# Patient Record
Sex: Male | Born: 1945 | Race: White | Hispanic: No | Marital: Single | Smoking: Former smoker
Health system: Southern US, Community
[De-identification: ages and names within clinical notes are randomized; demographics above are authoritative.]

## PROBLEM LIST (undated history)

## (undated) DIAGNOSIS — D689 Coagulation defect, unspecified: Secondary | ICD-10-CM

## (undated) DIAGNOSIS — H9319 Tinnitus, unspecified ear: Secondary | ICD-10-CM

## (undated) DIAGNOSIS — I10 Essential (primary) hypertension: Secondary | ICD-10-CM

## (undated) DIAGNOSIS — J329 Chronic sinusitis, unspecified: Secondary | ICD-10-CM

## (undated) DIAGNOSIS — E785 Hyperlipidemia, unspecified: Secondary | ICD-10-CM

## (undated) HISTORY — PX: CATARACT EXTRACTION: SUR2

## (undated) HISTORY — DX: Hyperlipidemia, unspecified: E78.5

## (undated) HISTORY — DX: Chronic sinusitis, unspecified: J32.9

## (undated) HISTORY — DX: Essential (primary) hypertension: I10

## (undated) HISTORY — DX: Tinnitus, unspecified ear: H93.19

## (undated) HISTORY — DX: Coagulation defect, unspecified: D68.9

---
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
## 2011-08-31 ENCOUNTER — Ambulatory Visit: Payer: Self-pay | Admitting: Internal Medicine

## 2011-09-15 ENCOUNTER — Inpatient Hospital Stay: Payer: Self-pay | Admitting: Student

## 2011-09-29 ENCOUNTER — Ambulatory Visit: Payer: Self-pay | Admitting: Internal Medicine

## 2011-10-01 ENCOUNTER — Ambulatory Visit: Payer: Self-pay | Admitting: Internal Medicine

## 2012-03-21 DIAGNOSIS — J309 Allergic rhinitis, unspecified: Secondary | ICD-10-CM | POA: Insufficient documentation

## 2012-10-27 IMAGING — US US EXTREM LOW VENOUS*L*
1 series · 17 of 24 positions shown · non-contrast
Comparison: none

REASON FOR EXAM: pain  swelling
COMMENTS:

PROCEDURE:     US  - US DOPPLER LOW EXTR LEFT  - September 15, 2011  [DATE]
RESULT:     Comparison: None

[Series 1: us extrem low venous*left* · 17 of 24 slices shown]
[im 1/24]
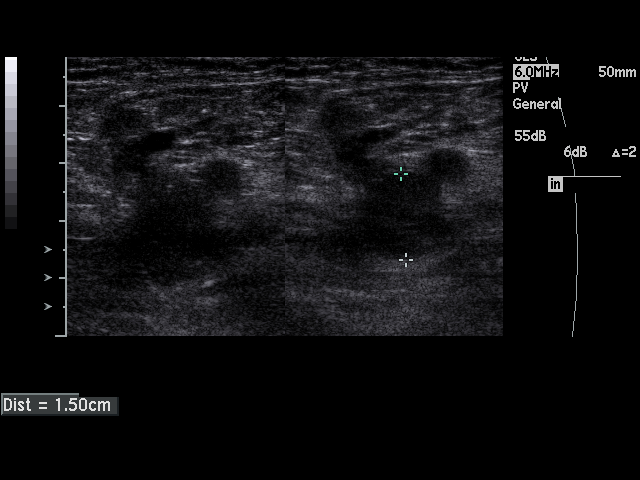
[im 3/24]
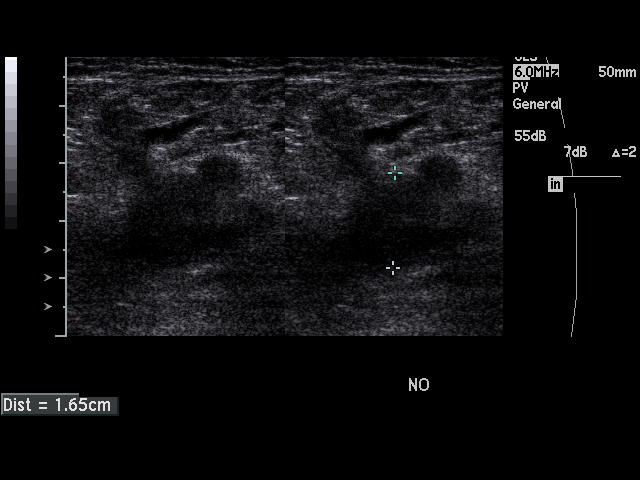
[im 4/24]
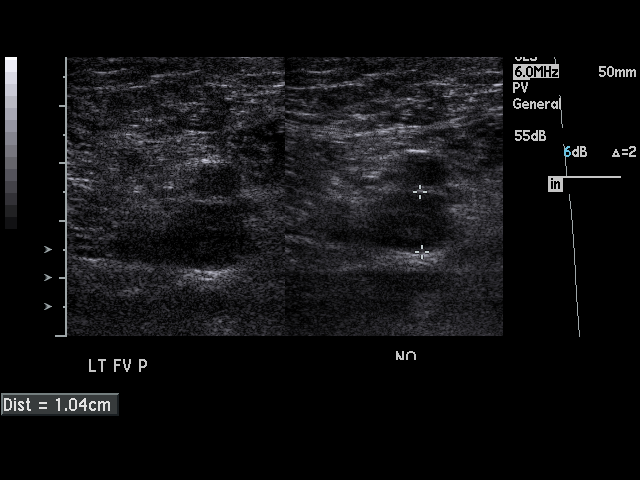
[im 5/24]
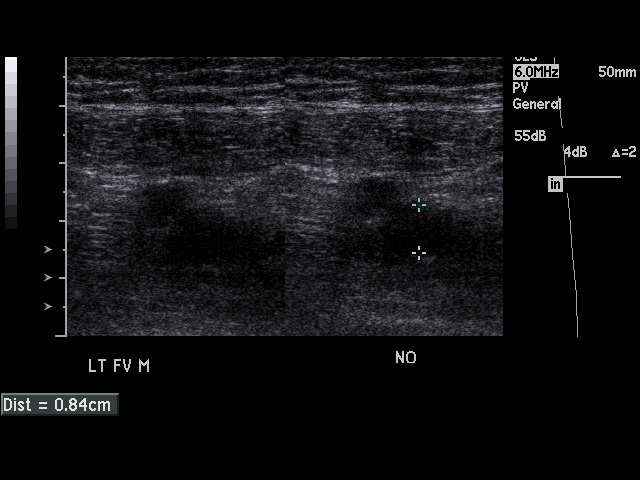
[im 7/24]
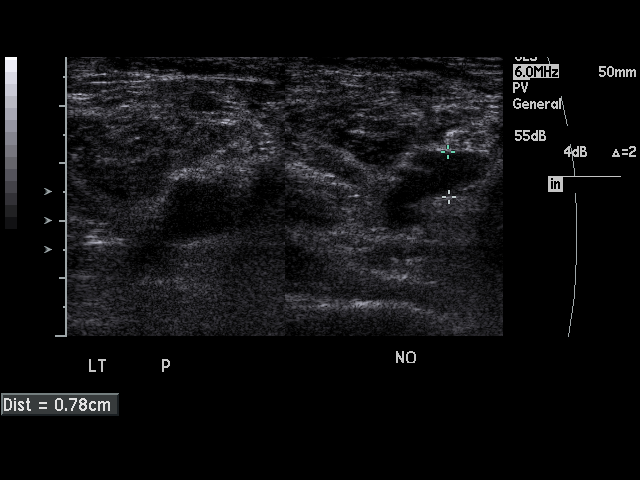
[im 8/24]
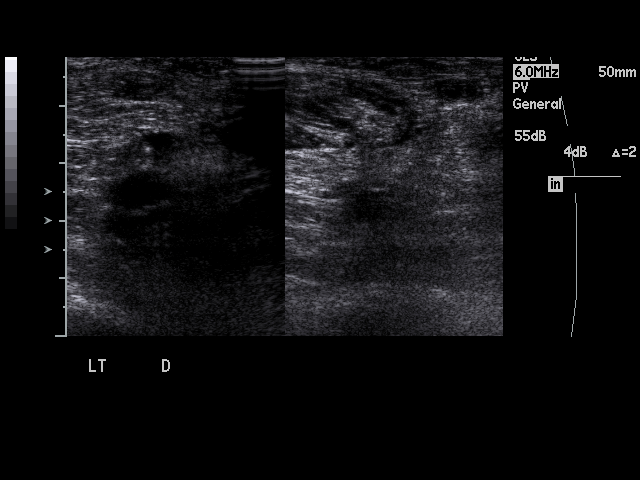
[im 10/24]
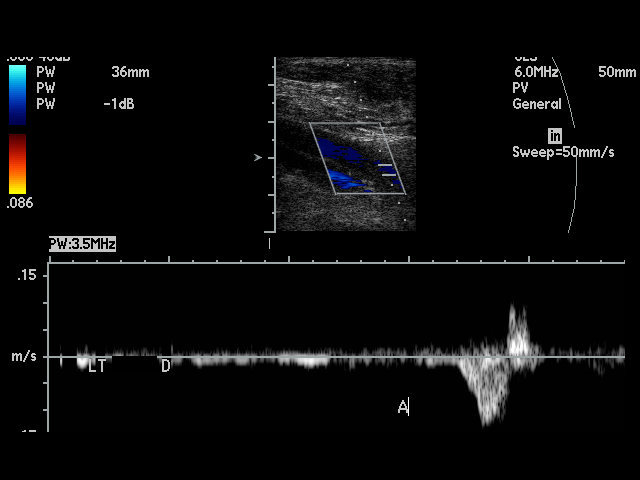
[im 11/24]
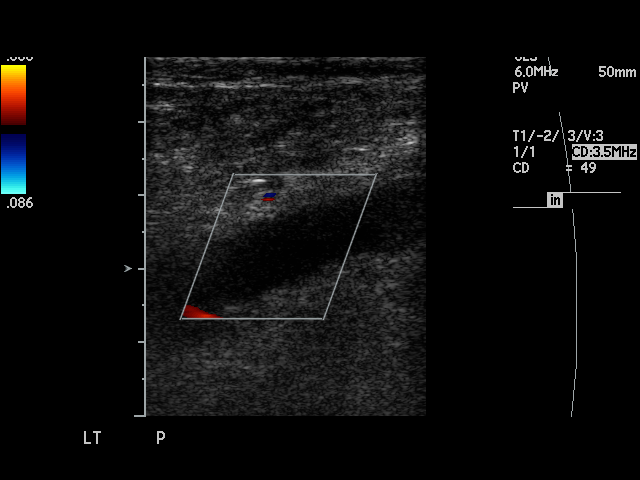
[im 13/24]
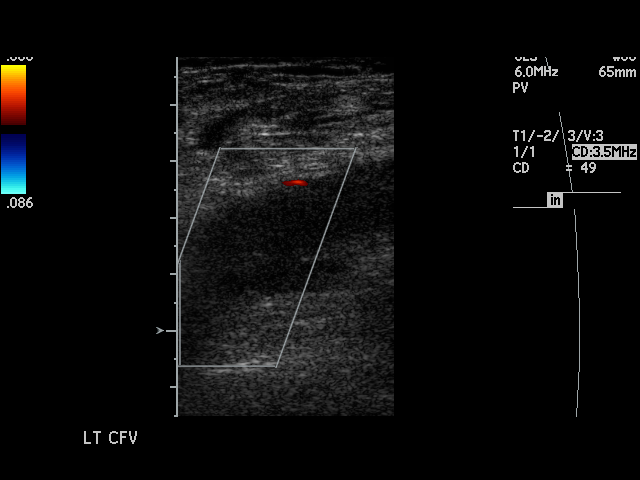
[im 14/24]
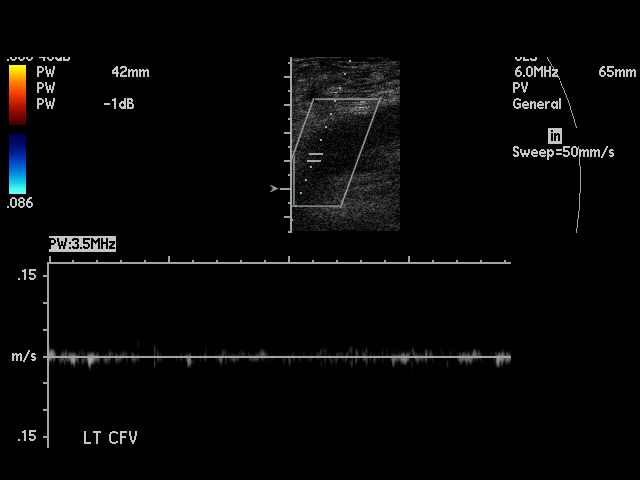
[im 15/24]
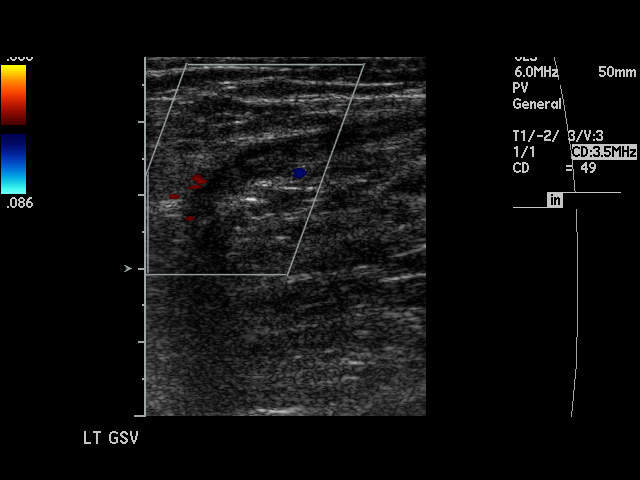
[im 17/24]
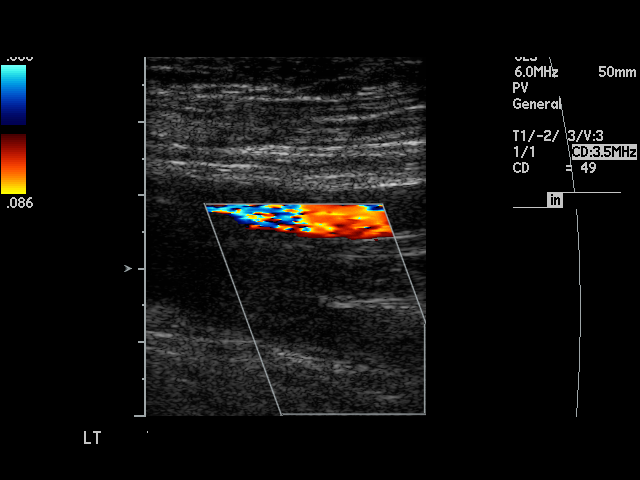
[im 18/24]
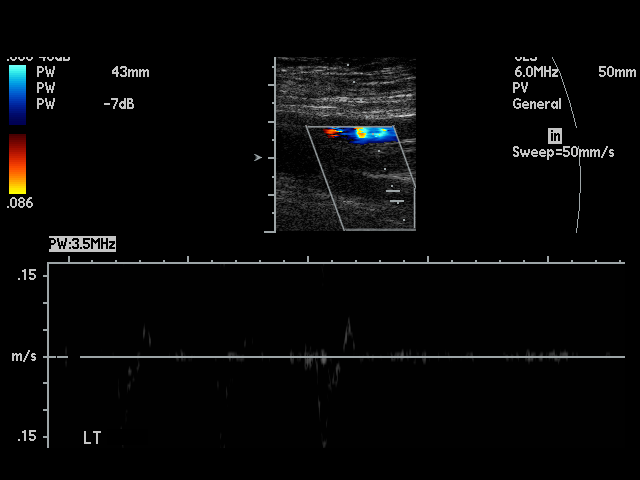
[im 20/24]
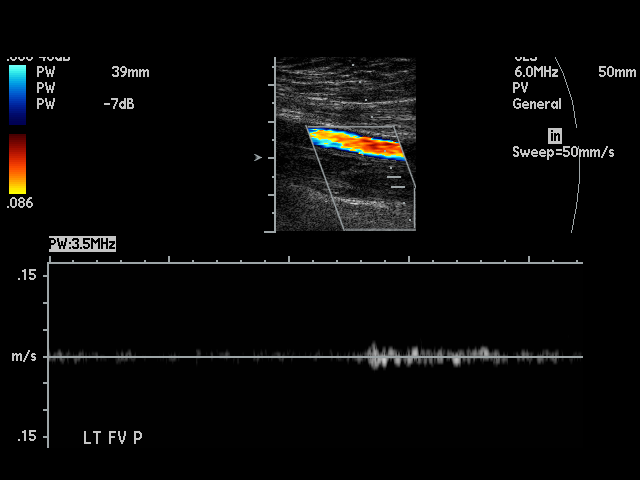
[im 21/24]
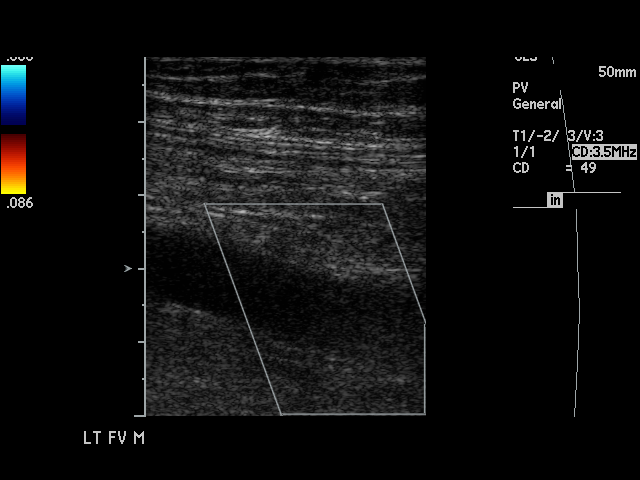
[im 22/24]
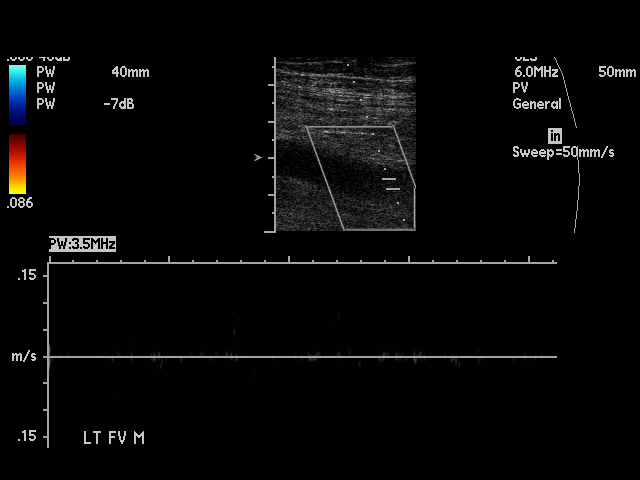
[im 24/24]
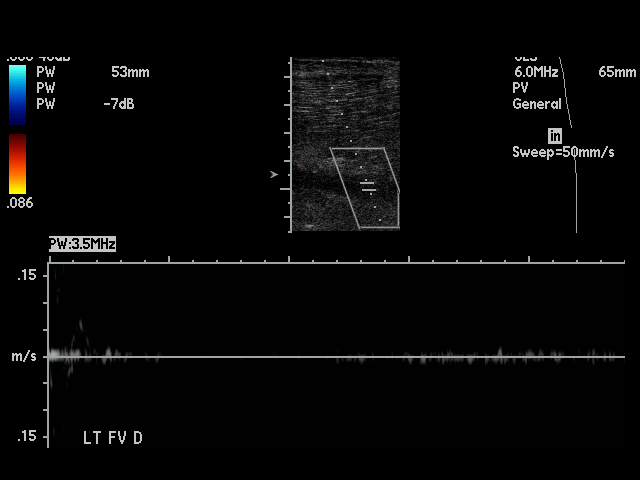

[17 of 24 positions shown; findings below may reference images not displayed]

FINDINGS: Multiple longitudinal and transverse gray-scale as well as color
and spectral Doppler images of the left lower extremity veins were obtained
from the common femoral veins through the popliteal veins.

The left common femoral, femoral and popliteal vein are noncompressible with
no color-flow demonstrated through these regions most consistent with
occlusive deep venous thrombosis.
IMPRESSION: Occlusive deep venous thrombosis involving the left common femoral, femoral
and popliteal veins. These findings were communicated to Dr. Jaone on
09/15/2011 at 4824 hours.

## 2012-10-27 IMAGING — CT CT CHEST W/ CM
1 series · 15 of 33 positions shown, 19 images · IV contrast (APPLIED)
Comparison: None

REASON FOR EXAM: patient with DVT, evaluate for PE
COMMENTS:

PROCEDURE:     CT  - CT CHEST (FOR PE) W  - September 15, 2011  [DATE]
RESULT:     Indications: Deep venous thrombosis
TECHNIQUE: A thin-section spiral CT from the lung apices to the upper
abdomen was acquired on a multi slice scanner following 75 ml [id number]
intravenous contrast. These images were then transferred to the Siemens work
station and were subsequently reviewed utilizing 3-D reconstructions and MIP
images.

[Series 4: soft tissue · axial · 0.74mm/px · z∈[-358,-73]mm · 15 of 113 slices shown, 19 images]
[im 9/113  mediastinal]
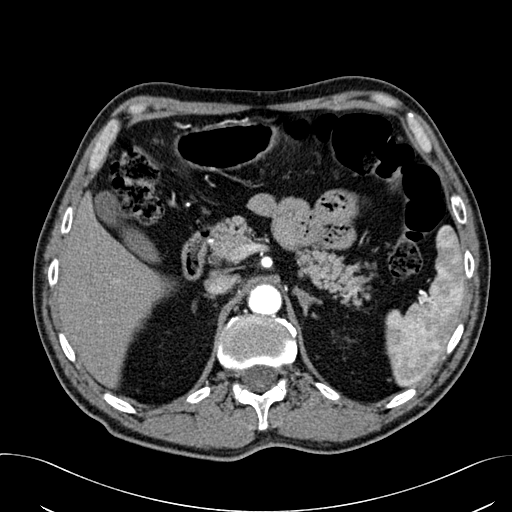
[im 9/113  lung]
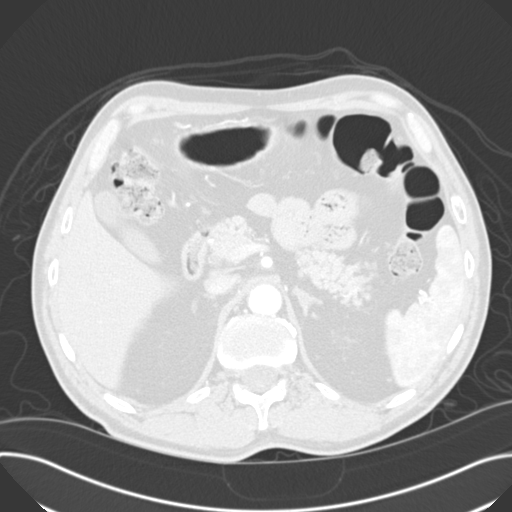
[im 17/113  lung]
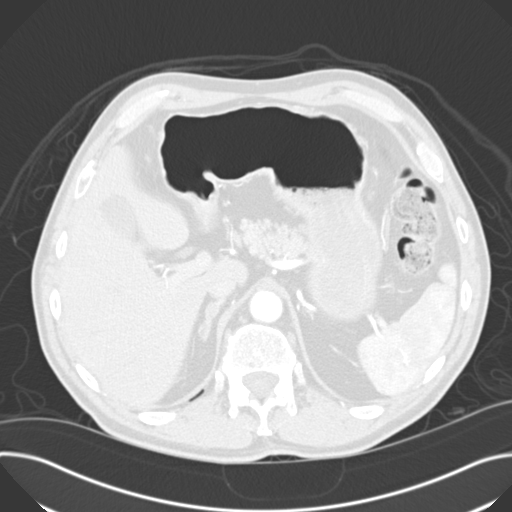
[im 23/113  lung]
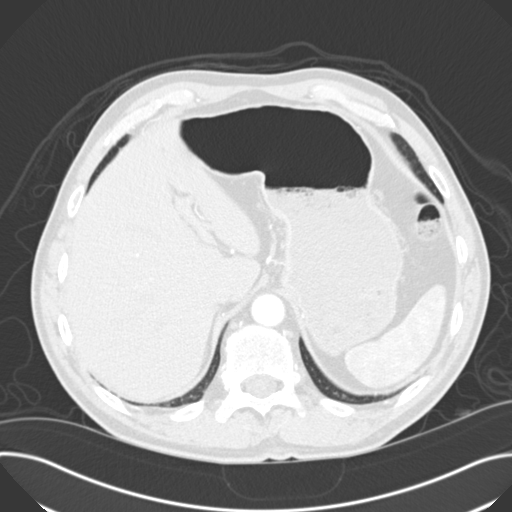
[im 30/113  lung]
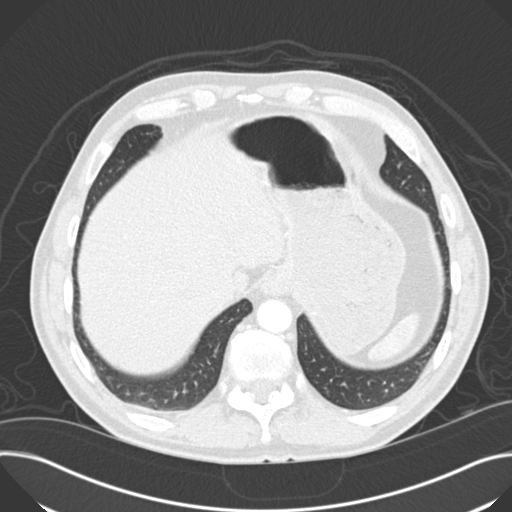
[im 38/113  mediastinal]
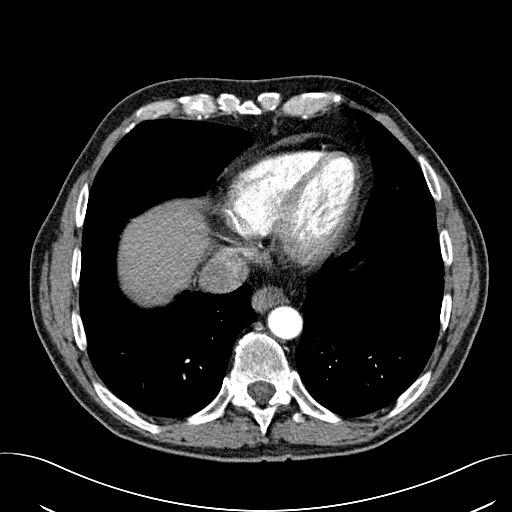
[im 38/113  lung]
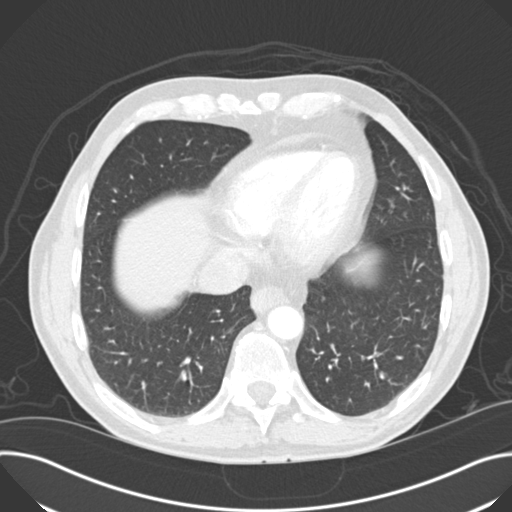
[im 45/113  lung]
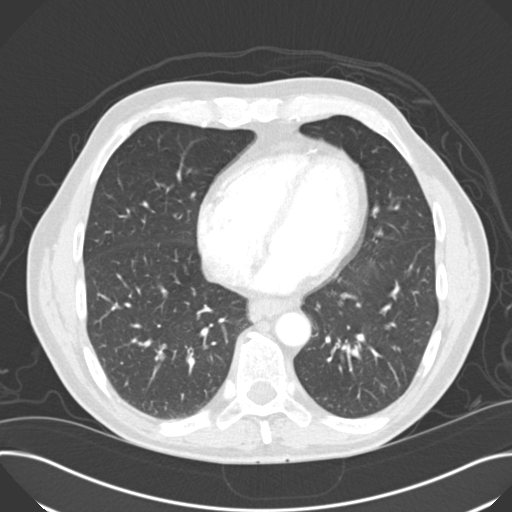
[im 50/113  lung]
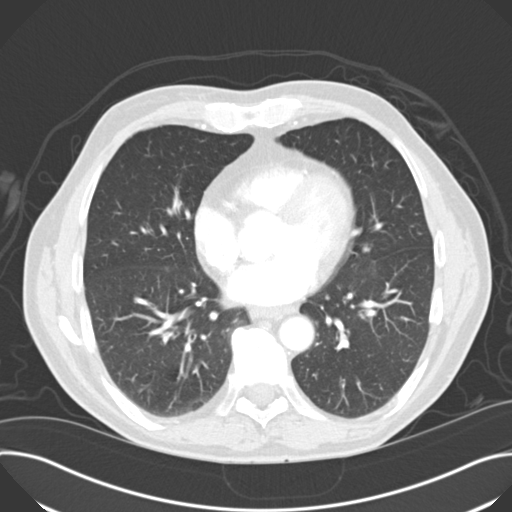
[im 59/113  lung]
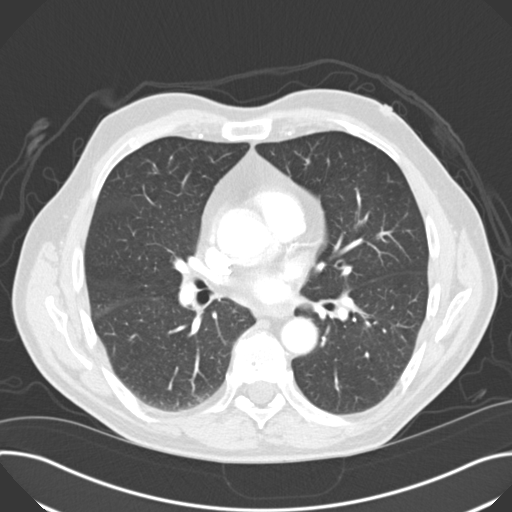
[im 63/113  mediastinal]
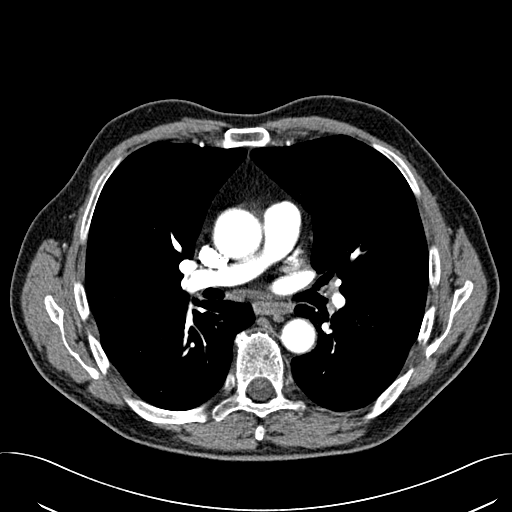
[im 63/113  lung]
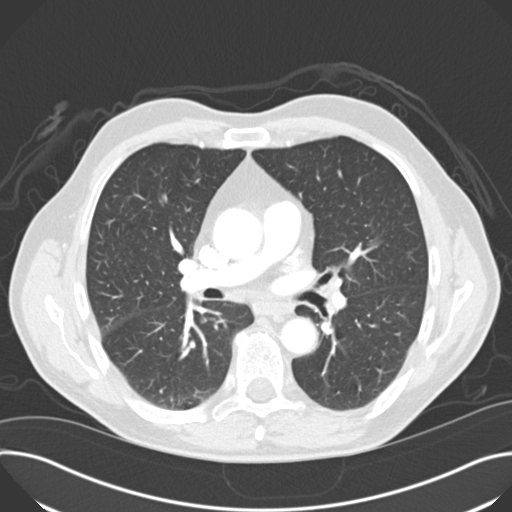
[im 68/113  lung]
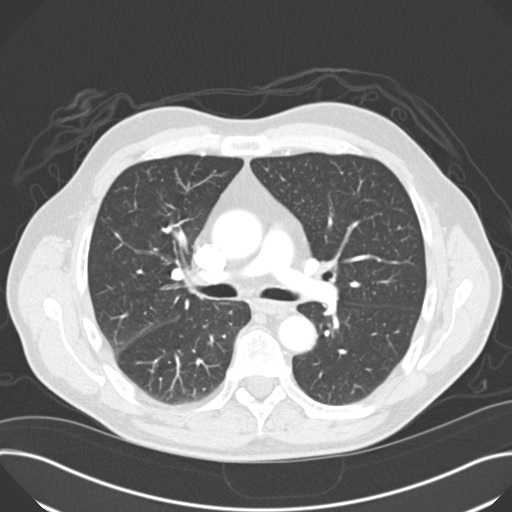
[im 75/113  lung]
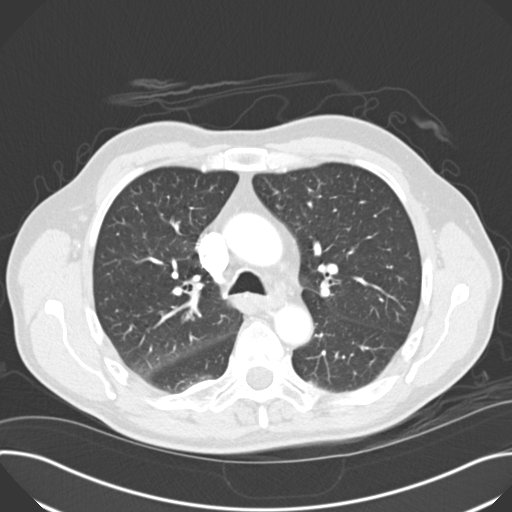
[im 83/113  lung]
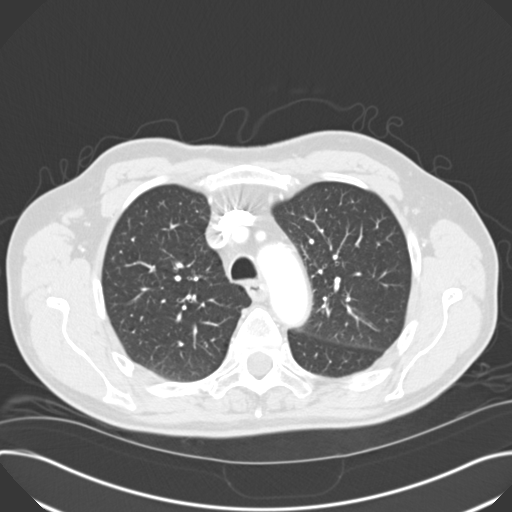
[im 90/113  mediastinal]
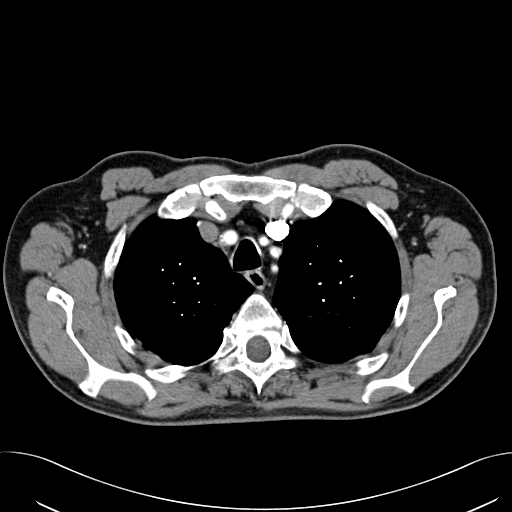
[im 90/113  lung]
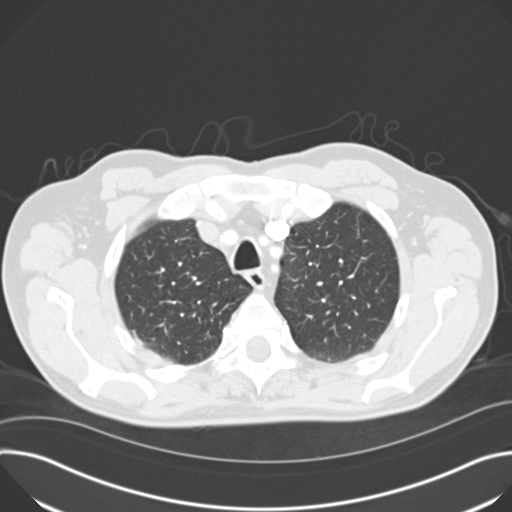
[im 96/113  lung]
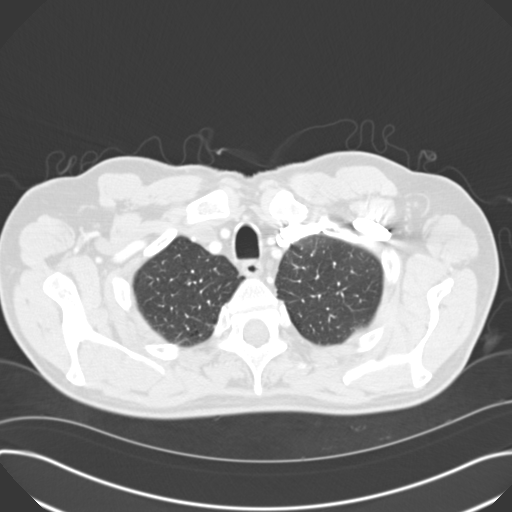
[im 104/113  lung]
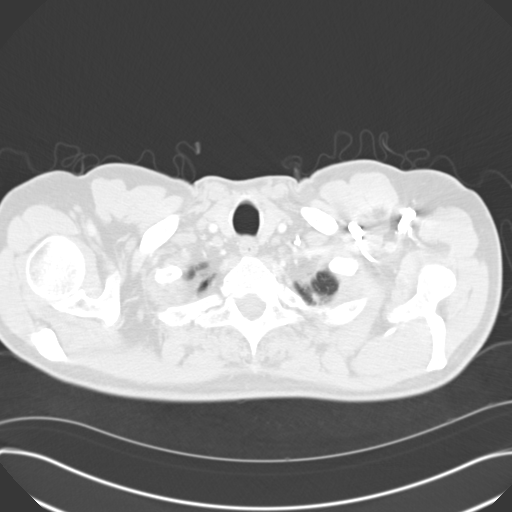

[15 of 33 positions shown; findings below may reference images not displayed]

FINDINGS: There is adequate opacification of the pulmonary arteries. There are small
pulmonary emboli in the right lower lobe and left lower lobe subsegmental
pulmonary arteries. The main pulmonary artery, right main pulmonary artery,
and left main pulmonary arteries are normal in size. The heart size is
normal. There is no pericardial effusion. There is coronary artery
atherosclerosis in the LAD and right coronary artery.

The lungs are clear. There is no focal consolidation, pleural effusion, or
pneumothorax.

There is no axillary, hilar, or mediastinal adenopathy.

The osseous structures are unremarkable.

The visualized portions of the upper abdomen are unremarkable.
IMPRESSION: 1.  Small pulmonary emboli in the right lower lobe and left lower lobe
subsegmental pulmonary arteries.

## 2014-04-09 LAB — BASIC METABOLIC PANEL
BUN: 15 mg/dL (ref 4–21)
Creatinine: 1 mg/dL (ref 0.6–1.3)
Glucose: 92 mg/dL
Potassium: 4.4 mmol/L (ref 3.4–5.3)
Sodium: 139 mmol/L (ref 137–147)

## 2014-04-09 LAB — LIPID PANEL
Cholesterol: 107 mg/dL (ref 0–200)
HDL: 32 mg/dL — AB (ref 35–70)
LDL Cholesterol: 61 mg/dL
Triglycerides: 69 mg/dL (ref 40–160)

## 2014-04-09 LAB — PSA: PSA: 3.7

## 2014-12-11 DIAGNOSIS — Z86711 Personal history of pulmonary embolism: Secondary | ICD-10-CM | POA: Diagnosis not present

## 2015-01-08 DIAGNOSIS — Z86711 Personal history of pulmonary embolism: Secondary | ICD-10-CM | POA: Diagnosis not present

## 2015-02-05 DIAGNOSIS — Z86711 Personal history of pulmonary embolism: Secondary | ICD-10-CM | POA: Diagnosis not present

## 2015-02-19 DIAGNOSIS — Z86711 Personal history of pulmonary embolism: Secondary | ICD-10-CM | POA: Diagnosis not present

## 2015-04-10 DIAGNOSIS — Z86711 Personal history of pulmonary embolism: Secondary | ICD-10-CM | POA: Diagnosis not present

## 2015-04-10 LAB — POCT INR: INR: 2.1 — AB (ref <=1.1)

## 2015-04-10 LAB — PROTIME-INR: Protime: 25.6 seconds — AB (ref 10.0–13.8)

## 2015-05-02 ENCOUNTER — Other Ambulatory Visit: Payer: Self-pay | Admitting: Family Medicine

## 2015-05-03 DIAGNOSIS — Z86718 Personal history of other venous thrombosis and embolism: Secondary | ICD-10-CM | POA: Insufficient documentation

## 2015-05-03 DIAGNOSIS — E785 Hyperlipidemia, unspecified: Secondary | ICD-10-CM | POA: Insufficient documentation

## 2015-05-03 DIAGNOSIS — F329 Major depressive disorder, single episode, unspecified: Secondary | ICD-10-CM | POA: Insufficient documentation

## 2015-05-03 DIAGNOSIS — IMO0001 Reserved for inherently not codable concepts without codable children: Secondary | ICD-10-CM | POA: Insufficient documentation

## 2015-05-03 DIAGNOSIS — Z86711 Personal history of pulmonary embolism: Secondary | ICD-10-CM | POA: Insufficient documentation

## 2015-05-03 DIAGNOSIS — I10 Essential (primary) hypertension: Secondary | ICD-10-CM | POA: Insufficient documentation

## 2015-05-03 DIAGNOSIS — J45909 Unspecified asthma, uncomplicated: Secondary | ICD-10-CM | POA: Insufficient documentation

## 2015-05-03 DIAGNOSIS — D6859 Other primary thrombophilia: Secondary | ICD-10-CM | POA: Insufficient documentation

## 2015-05-03 DIAGNOSIS — F32A Depression, unspecified: Secondary | ICD-10-CM | POA: Insufficient documentation

## 2015-05-08 ENCOUNTER — Ambulatory Visit (INDEPENDENT_AMBULATORY_CARE_PROVIDER_SITE_OTHER): Payer: Medicare Other

## 2015-05-08 DIAGNOSIS — I2699 Other pulmonary embolism without acute cor pulmonale: Secondary | ICD-10-CM

## 2015-05-08 DIAGNOSIS — R7301 Impaired fasting glucose: Secondary | ICD-10-CM | POA: Insufficient documentation

## 2015-05-08 LAB — POCT INR
INR: 2.2
PT: 26.6

## 2015-05-08 NOTE — Progress Notes (Signed)
Advised pt to come for recheck in 4 weeks. PT/INR 2.2 (INR) Continue same dose as directed by MD.

## 2015-05-31 ENCOUNTER — Telehealth: Payer: Self-pay | Admitting: Family Medicine

## 2015-05-31 ENCOUNTER — Other Ambulatory Visit: Payer: Self-pay | Admitting: Family Medicine

## 2015-05-31 NOTE — Telephone Encounter (Signed)
Im not sure what this is regarding. Please get more information. Thanks

## 2015-05-31 NOTE — Telephone Encounter (Signed)
Pt needs to come in for his coumadin but he can only come in the evening.

## 2015-05-31 NOTE — Telephone Encounter (Signed)
Pt can only come in the afternoon.  Can we make an exception and he come in the pm.  Please call him at  [phone].  Thanks, tp

## 2015-06-04 NOTE — Telephone Encounter (Signed)
Looks like patient is due to have his PT/INR checked by 06/07/2015. Patient does not have an appointment scheduled. Please advised if it is ok for patient to come in the afternoon to have this checked.

## 2015-06-04 NOTE — Telephone Encounter (Signed)
That's fine, he can have an appointment for PT/INR in the afternoon

## 2015-06-05 ENCOUNTER — Ambulatory Visit: Payer: Medicare Other

## 2015-06-05 ENCOUNTER — Ambulatory Visit (INDEPENDENT_AMBULATORY_CARE_PROVIDER_SITE_OTHER): Payer: Medicare Other | Admitting: Family Medicine

## 2015-06-05 DIAGNOSIS — I2699 Other pulmonary embolism without acute cor pulmonale: Secondary | ICD-10-CM

## 2015-06-05 LAB — POCT INR
INR: 2.2
PT: 26.4

## 2015-06-05 NOTE — Progress Notes (Signed)
PT only

## 2015-06-05 NOTE — Telephone Encounter (Signed)
Pt is coming in 7/6 for and appt per Dr. Caryn Section.  tp

## 2015-06-05 NOTE — Patient Instructions (Signed)
Continue same dose of Coumadin (5mg  daily except 2.5mg  on M, W,F).  Recheck in 4 weeks.  Call with any questions or concerns.

## 2015-07-03 ENCOUNTER — Ambulatory Visit (INDEPENDENT_AMBULATORY_CARE_PROVIDER_SITE_OTHER): Payer: Medicare Other

## 2015-07-03 DIAGNOSIS — I2699 Other pulmonary embolism without acute cor pulmonale: Secondary | ICD-10-CM

## 2015-07-03 LAB — POCT INR
INR: 2.3
PT: 27.7

## 2015-07-03 NOTE — Patient Instructions (Signed)
Continue same dose, Warfarin 2.5 mg MWF and 5 mg the other days. Call if any questions or concerns. Come back in 4 weeks.

## 2015-07-27 ENCOUNTER — Other Ambulatory Visit: Payer: Self-pay | Admitting: Family Medicine

## 2015-08-02 ENCOUNTER — Ambulatory Visit (INDEPENDENT_AMBULATORY_CARE_PROVIDER_SITE_OTHER): Payer: Medicare Other | Admitting: Family Medicine

## 2015-08-02 DIAGNOSIS — I2699 Other pulmonary embolism without acute cor pulmonale: Secondary | ICD-10-CM

## 2015-08-02 LAB — POCT INR
INR: 2.4
PT: 29.4

## 2015-08-02 NOTE — Patient Instructions (Signed)
Continue same dose, 2.5 mg MWF and 5mg  the other days. Will recheck in 4 weeks.

## 2015-09-04 ENCOUNTER — Ambulatory Visit (INDEPENDENT_AMBULATORY_CARE_PROVIDER_SITE_OTHER): Payer: Medicare Other

## 2015-09-04 DIAGNOSIS — I2699 Other pulmonary embolism without acute cor pulmonale: Secondary | ICD-10-CM

## 2015-09-04 LAB — POCT INR
INR: 2.4
PT: 28.8

## 2015-09-04 NOTE — Patient Instructions (Signed)
Continue current dose. Return in 4 weeks. Call if any questions or concerns.

## 2015-10-02 ENCOUNTER — Ambulatory Visit (INDEPENDENT_AMBULATORY_CARE_PROVIDER_SITE_OTHER): Payer: Medicare Other | Admitting: Family Medicine

## 2015-10-02 DIAGNOSIS — I2699 Other pulmonary embolism without acute cor pulmonale: Secondary | ICD-10-CM | POA: Diagnosis not present

## 2015-10-02 LAB — POCT INR
INR: 2.2
PT: 26.8

## 2015-10-02 NOTE — Progress Notes (Signed)
PT only. No MD visit

## 2015-10-02 NOTE — Patient Instructions (Signed)
No change. Continue 2.5mg  M,W,F and 5mg  the other days.  Will recheck in 4 weeks.

## 2015-11-01 ENCOUNTER — Ambulatory Visit (INDEPENDENT_AMBULATORY_CARE_PROVIDER_SITE_OTHER): Payer: Medicare Other | Admitting: Family Medicine

## 2015-11-01 DIAGNOSIS — I2699 Other pulmonary embolism without acute cor pulmonale: Secondary | ICD-10-CM

## 2015-11-01 DIAGNOSIS — Z86718 Personal history of other venous thrombosis and embolism: Secondary | ICD-10-CM

## 2015-11-01 LAB — POCT INR
INR: 1.9
PT: 23.2

## 2015-11-18 ENCOUNTER — Other Ambulatory Visit: Payer: Self-pay | Admitting: Family Medicine

## 2015-12-04 ENCOUNTER — Ambulatory Visit (INDEPENDENT_AMBULATORY_CARE_PROVIDER_SITE_OTHER): Payer: Medicare Other | Admitting: Family Medicine

## 2015-12-04 DIAGNOSIS — I2699 Other pulmonary embolism without acute cor pulmonale: Secondary | ICD-10-CM | POA: Diagnosis not present

## 2015-12-04 LAB — POCT INR
INR: 2.3
PT, fingerstick: 28

## 2015-12-04 NOTE — Progress Notes (Signed)
PT/INR only. No MD visit

## 2015-12-15 ENCOUNTER — Other Ambulatory Visit: Payer: Self-pay | Admitting: Family Medicine

## 2015-12-15 DIAGNOSIS — Z86711 Personal history of pulmonary embolism: Secondary | ICD-10-CM

## 2016-01-07 ENCOUNTER — Encounter: Payer: Self-pay | Admitting: Family Medicine

## 2016-01-07 ENCOUNTER — Ambulatory Visit (INDEPENDENT_AMBULATORY_CARE_PROVIDER_SITE_OTHER): Payer: Medicare Other | Admitting: Family Medicine

## 2016-01-07 VITALS — BP 132/78 | HR 71 | Temp 97.9°F | Resp 16 | Ht 73.0 in | Wt 179.0 lb

## 2016-01-07 DIAGNOSIS — E785 Hyperlipidemia, unspecified: Secondary | ICD-10-CM

## 2016-01-07 DIAGNOSIS — I10 Essential (primary) hypertension: Secondary | ICD-10-CM

## 2016-01-07 DIAGNOSIS — R7301 Impaired fasting glucose: Secondary | ICD-10-CM

## 2016-01-07 DIAGNOSIS — Z23 Encounter for immunization: Secondary | ICD-10-CM

## 2016-01-07 DIAGNOSIS — I2699 Other pulmonary embolism without acute cor pulmonale: Secondary | ICD-10-CM

## 2016-01-07 LAB — POCT INR
INR: 2
PT: 23.9

## 2016-01-07 NOTE — Patient Instructions (Signed)
Continue same dose, 2.5 mg MWF and 5mg  the other days. Will recheck in 4 weeks. If questions or concerns please call.

## 2016-01-07 NOTE — Progress Notes (Signed)
Patient: Thomas [name] Male    DOB: 10-20-1946   70 y.o.   MRN: [id number] Visit Date: 01/07/2016  Today's Provider: Lelon Huh, MD   Chief Complaint  Patient presents with  . Hypertension    follow up  . Hyperlipidemia    follow up  . Anticoagulation    follow up   Subjective:    HPI  Hypertension, follow-up:  BP Readings from Last 3 Encounters:  No data found for BP    He was last seen for hypertension 2 years ago.  BP at that visit was 126/72. Management since that visit includes no changes. He reports good compliance with treatment. He is not having side effects.  He is not exercising. He is not adherent to low salt diet.   Outside blood pressures are not being checked. He is experiencing none.  Patient denies chest pain, chest pressure/discomfort, claudication, dyspnea, exertional chest pressure/discomfort, fatigue, irregular heart beat, lower extremity edema, near-syncope, orthopnea, palpitations, paroxysmal nocturnal dyspnea, syncope and tachypnea.   Cardiovascular risk factors include advanced age (older than 82 for men, 26 for women), dyslipidemia and hypertension.  Use of agents associated with hypertension: none.     Weight trend: decreasing steadily Wt Readings from Last 3 Encounters:  No data found for Wt    Current diet: in general, an "unhealthy" diet  ------------------------------------------------------------------------   Lipid/Cholesterol, Follow-up:   Last seen for this2 years ago.  Management changes since that visit include none. . Last Lipid Panel:    Component Value Date/Time   CHOL 107 04/09/2014   TRIG 69 04/09/2014   HDL 32* 04/09/2014   LDLCALC 61 04/09/2014    Risk factors for vascular disease include hypercholesterolemia and hypertension  He reports good compliance with treatment. He is not having side effects.  Current symptoms include none and have been stable. Weight trend: decreasing steadily Prior  visit with dietician: no Current diet: in general, an "unhealthy" diet Current exercise: none  Wt Readings from Last 3 Encounters:  No data found for Wt    ------------------------------------------------------------------- Follow up warfarin managements for history of PE Last visit was 1 month ago and no  Changes were made. Patient reports good compliance with treatment.     Allergies  Allergen Reactions  . Dexamethasone Other (See Comments)    Renal Insufficiency   Previous Medications   AMLODIPINE (NORVASC) 2.5 MG TABLET    TAKE 1 TABLET BY MOUTH EVERY DAY   ATORVASTATIN (LIPITOR) 10 MG TABLET    TAKE 1 TABLET BY MOUTH EVERY DAY   WARFARIN (COUMADIN) 5 MG TABLET    TAKE 1 TABLET BY MOUTH EVERY DAY    Review of Systems  Constitutional: Negative for fever, chills, diaphoresis, appetite change and fatigue.  Respiratory: Negative for cough, chest tightness, shortness of breath and wheezing.   Cardiovascular: Negative for chest pain and palpitations.  Gastrointestinal: Negative for nausea, vomiting and abdominal pain.    Social History  Substance Use Topics  . Smoking status: Not on file  . Smokeless tobacco: Not on file  . Alcohol Use: Not on file   Objective:   BP 132/78 mmHg  Pulse 71  Temp(Src) 97.9 F (36.6 C) (Oral)  Resp 16  Ht 6\' 1"  (1.854 m)  Wt 179 lb (81.194 kg)  BMI 23.62 kg/m2  SpO2 98%  Physical Exam  General Appearance:    Alert, cooperative, no distress, obese  Eyes:    PERRL, conjunctiva/corneas clear, EOM's intact  Lungs:     Clear to auscultation bilaterally, respirations unlabored  Heart:    Regular rate and rhythm, II/VI systolic murmur.   Neurologic:   Awake, alert, oriented x 3. No apparent focal neurological           defect.       Results for orders placed or performed in visit on 01/07/16  Hemoglobin A1c  Result Value Ref Range   Hgb A1c MFr Bld 5.7 (H) 4.8 - 5.6 %   Est. average glucose Bld gHb Est-mCnc 117 mg/dL  Lipid panel   Result Value Ref Range   Cholesterol, Total 108 100 - 199 mg/dL   Triglycerides 50 0 - 149 mg/dL   HDL 38 (L) >39 mg/dL   VLDL Cholesterol Cal 10 5 - 40 mg/dL   LDL Calculated 60 0 - 99 mg/dL   Chol/HDL Ratio 2.8 0.0 - 5.0 ratio units  Comprehensive metabolic panel  Result Value Ref Range   Glucose 101 (H) 65 - 99 mg/dL   BUN 10 8 - 27 mg/dL   Creatinine, Ser 1.06 0.76 - 1.27 mg/dL   GFR calc non Af Amer 71 >59 mL/min/1.73   GFR calc Af Amer 82 >59 mL/min/1.73   BUN/Creatinine Ratio 9 (L) 10 - 22   Sodium 142 134 - 144 mmol/L   Potassium 4.6 3.5 - 5.2 mmol/L   Chloride 100 96 - 106 mmol/L   CO2 27 18 - 29 mmol/L   Calcium 9.5 8.6 - 10.2 mg/dL   Total Protein 7.3 6.0 - 8.5 g/dL   Albumin 4.2 3.6 - 4.8 g/dL   Globulin, Total 3.1 1.5 - 4.5 g/dL   Albumin/Globulin Ratio 1.4 1.1 - 2.5   Bilirubin Total 0.4 0.0 - 1.2 mg/dL   Alkaline Phosphatase 52 39 - 117 IU/L   AST 13 0 - 40 IU/L   ALT 10 0 - 44 IU/L  POCT INR  Result Value Ref Range   INR 2.0    PT 23.9         Assessment & Plan:     1. History of Pulmonary embolism (HCC)  - POCT INR  2. Essential hypertension Well controlled.  Continue current medications.   - EKG 12-Lead - Comprehensive metabolic panel  3. Elevated fasting blood sugar  - Hemoglobin A1c  4. HLD (hyperlipidemia) He is tolerating atorvastatin well with no adverse effects.   - Lipid panel - Comprehensive metabolic panel  5. Need for pneumococcal vaccination  - Pneumococcal conjugate vaccine 13-valent IM        Lelon Huh, MD  [facility]

## 2016-01-08 ENCOUNTER — Telehealth: Payer: Self-pay

## 2016-01-08 LAB — COMPREHENSIVE METABOLIC PANEL
ALT: 10 IU/L (ref 0–44)
AST: 13 IU/L (ref 0–40)
Albumin/Globulin Ratio: 1.4 (ref 1.1–2.5)
Albumin: 4.2 g/dL (ref 3.6–4.8)
Alkaline Phosphatase: 52 IU/L (ref 39–117)
BUN/Creatinine Ratio: 9 — ABNORMAL LOW (ref 10–22)
BUN: 10 mg/dL (ref 8–27)
Bilirubin Total: 0.4 mg/dL (ref 0.0–1.2)
CO2: 27 mmol/L (ref 18–29)
Calcium: 9.5 mg/dL (ref 8.6–10.2)
Chloride: 100 mmol/L (ref 96–106)
Creatinine, Ser: 1.06 mg/dL (ref 0.76–1.27)
GFR calc Af Amer: 82 mL/min/{1.73_m2} (ref >59)
GFR calc non Af Amer: 71 mL/min/{1.73_m2} (ref >59)
Globulin, Total: 3.1 g/dL (ref 1.5–4.5)
Glucose: 101 mg/dL — ABNORMAL HIGH (ref 65–99)
Potassium: 4.6 mmol/L (ref 3.5–5.2)
Sodium: 142 mmol/L (ref 134–144)
Total Protein: 7.3 g/dL (ref 6.0–8.5)

## 2016-01-08 LAB — LIPID PANEL
Chol/HDL Ratio: 2.8 ratio units (ref 0.0–5.0)
Cholesterol, Total: 108 mg/dL (ref 100–199)
HDL: 38 mg/dL — ABNORMAL LOW (ref >39)
LDL Calculated: 60 mg/dL (ref 0–99)
Triglycerides: 50 mg/dL (ref 0–149)
VLDL Cholesterol Cal: 10 mg/dL (ref 5–40)

## 2016-01-08 LAB — HEMOGLOBIN A1C
Est. average glucose Bld gHb Est-mCnc: 117 mg/dL
Hgb A1c MFr Bld: 5.7 % — ABNORMAL HIGH (ref 4.8–5.6)

## 2016-01-08 NOTE — Telephone Encounter (Signed)
Patient notified of results. Expressed understanding.

## 2016-01-08 NOTE — Telephone Encounter (Signed)
-----   Message from Birdie Sons, MD sent at 01/08/2016  8:29 AM EST ----- Blood sugar, kidney functions, electrolytes and cholesterol are all normal. Continue current medications.  Check labs yearly.

## 2016-01-08 NOTE — Telephone Encounter (Signed)
LMTCB

## 2016-01-12 ENCOUNTER — Other Ambulatory Visit: Payer: Self-pay | Admitting: Family Medicine

## 2016-02-04 ENCOUNTER — Ambulatory Visit (INDEPENDENT_AMBULATORY_CARE_PROVIDER_SITE_OTHER): Payer: Medicare Other | Admitting: Family Medicine

## 2016-02-04 DIAGNOSIS — I2699 Other pulmonary embolism without acute cor pulmonale: Secondary | ICD-10-CM | POA: Diagnosis not present

## 2016-02-04 LAB — POCT INR
INR: 2
PT: 23.9

## 2016-03-04 ENCOUNTER — Ambulatory Visit (INDEPENDENT_AMBULATORY_CARE_PROVIDER_SITE_OTHER): Payer: Medicare Other

## 2016-03-04 DIAGNOSIS — I2699 Other pulmonary embolism without acute cor pulmonale: Secondary | ICD-10-CM | POA: Diagnosis not present

## 2016-03-04 LAB — POCT INR
INR: 1.8
PT: 21.8

## 2016-03-04 NOTE — Patient Instructions (Addendum)
Current Coumadin dose: 5mg  every day except 2.5mg  on Mon, Wed, Fri  Today's Change:   5mg  every day except 2.5mg  on Monday and Friday Recheck:    3 weeks

## 2016-03-25 ENCOUNTER — Ambulatory Visit (INDEPENDENT_AMBULATORY_CARE_PROVIDER_SITE_OTHER): Payer: Medicare Other

## 2016-03-25 DIAGNOSIS — I2699 Other pulmonary embolism without acute cor pulmonale: Secondary | ICD-10-CM | POA: Diagnosis not present

## 2016-03-25 LAB — POCT INR
INR: 2
PT: 23.7

## 2016-03-25 NOTE — Patient Instructions (Signed)
Current dose: Patient misunderstood changes made during last check 3 weeks ago on 03/04/2016. Patient was supposed to take 5mg  every day except 2.5mg  on Monday and Friday; instead patient was taking 2.5mg  on Monday and Friday, 5mg 's on all the other days and none on Wednesday.  PT:23.7 INR: 2.0 Changes made today:  Patient should start taking correctly as advised 3 weeks ago which was :5mg 's every day except 2.5mg  on Monday and Friday Recheck: 1 month

## 2016-04-24 ENCOUNTER — Ambulatory Visit (INDEPENDENT_AMBULATORY_CARE_PROVIDER_SITE_OTHER): Payer: Medicare Other

## 2016-04-24 DIAGNOSIS — I2699 Other pulmonary embolism without acute cor pulmonale: Secondary | ICD-10-CM | POA: Diagnosis not present

## 2016-04-24 LAB — POCT INR
INR: 3.3
PT: 39.3

## 2016-04-24 NOTE — Patient Instructions (Signed)
Anticoagulation Dose Instructions as of 04/24/2016      Thomas [name] Tue Wed Thu Fri Sat   New Dose 5 mg 2.5 mg 5 mg 5 mg 5 mg 2.5 mg 5 mg    Description         Current dose: 5mg  QD except 2.5mg  on Monday and Friday PT: 39.3 INR: 3.3 Changes made today:  Hold coumadin for 2 days, then resume current dose Recheck: 3 weeks

## 2016-05-15 ENCOUNTER — Ambulatory Visit (INDEPENDENT_AMBULATORY_CARE_PROVIDER_SITE_OTHER): Payer: Medicare Other

## 2016-05-15 DIAGNOSIS — I2699 Other pulmonary embolism without acute cor pulmonale: Secondary | ICD-10-CM | POA: Diagnosis not present

## 2016-05-15 LAB — POCT INR
INR: 2.4
PT: 28.6

## 2016-05-15 NOTE — Patient Instructions (Signed)
Anticoagulation Dose Instructions as of 05/15/2016      Thomas [name] Tue Wed Thu Fri Sat   New Dose 5 mg 2.5 mg 5 mg 5 mg 5 mg 2.5 mg 5 mg    Description        Dx: Pulmonary Embolism (ICD-10  I26.99) Current dose: 5mg  QD except, 2.5mg  on Monday and Friday PT: 28.6 INR:   2.4 Changes made today:  NO CHANGE Recheck:  4 weeks

## 2016-06-17 ENCOUNTER — Ambulatory Visit: Payer: Medicare Other | Admitting: Family Medicine

## 2016-06-19 ENCOUNTER — Ambulatory Visit (INDEPENDENT_AMBULATORY_CARE_PROVIDER_SITE_OTHER): Payer: Medicare Other | Admitting: Family Medicine

## 2016-06-19 DIAGNOSIS — I2699 Other pulmonary embolism without acute cor pulmonale: Secondary | ICD-10-CM | POA: Diagnosis not present

## 2016-06-19 LAB — POCT INR
INR: 2.6
PT: 30.6

## 2016-06-19 NOTE — Patient Instructions (Signed)
Anticoagulation Dose Instructions as of 06/19/2016      Thomas [name] Tue Wed Thu Fri Sat   New Dose 5 mg 2.5 mg 5 mg 5 mg 5 mg 2.5 mg 5 mg    Description        Dx: Pulmonary Embolism (ICD-10  I26.99) Current dose: 5mg  QD except, 2.5mg  on Monday and Friday PT: 28.6 INR:   2.4 Changes made today:  NO CHANGE Recheck:  4 weeks

## 2016-07-10 ENCOUNTER — Ambulatory Visit: Payer: Medicare Other | Admitting: Family Medicine

## 2016-07-16 ENCOUNTER — Telehealth: Payer: Self-pay

## 2016-07-16 NOTE — Telephone Encounter (Signed)
Pt is returning Roshena's call. Renaldo Fiddler, CMA

## 2016-07-17 ENCOUNTER — Ambulatory Visit (INDEPENDENT_AMBULATORY_CARE_PROVIDER_SITE_OTHER): Payer: Medicare Other | Admitting: Family Medicine

## 2016-07-17 DIAGNOSIS — I2699 Other pulmonary embolism without acute cor pulmonale: Secondary | ICD-10-CM

## 2016-07-17 LAB — POCT INR
INR: 2.8
PT: 33.4

## 2016-07-17 NOTE — Patient Instructions (Signed)
INR as of 07/17/2016 and Previous Dosing Information    INR Dt INR Goal Thomas [name] Sun Mon Tue Wed Thu Fri Sat   07/17/2016 2.8 - 30 mg 5 mg 2.5 mg 5 mg 5 mg 5 mg 2.5 mg 5 mg    Previous description   Dx: Pulmonary Embolism (ICD-10  I26.99) Current dose: 5mg  QD except, 2.5mg  on Monday and Friday PT: 28.6 INR:   2.4 Changes made today:  NO CHANGE Recheck:  4 weeks   Anticoagulation Dose Instructions as of 07/17/2016      Total Sun Mon Tue Wed Thu Fri Sat   New Dose 30 mg 5 mg 2.5 mg 5 mg 5 mg 5 mg 2.5 mg 5 mg     (5 mg x 1)  (5 mg x 0.5)  (5 mg x 1)  (5 mg x 1)  (5 mg x 1)  (5 mg x 0.5)  (5 mg x 1)                         Description   Dx: Pulmonary Embolism (ICD-10  I26.99) Current dose: 5mg  QD except, 2.5mg  on Monday and Friday PT: 28.6 INR:   2.4 Changes made today:  NO CHANGE Recheck:  4 weeks

## 2016-08-17 ENCOUNTER — Ambulatory Visit (INDEPENDENT_AMBULATORY_CARE_PROVIDER_SITE_OTHER): Payer: Medicare Other

## 2016-08-17 DIAGNOSIS — I2699 Other pulmonary embolism without acute cor pulmonale: Secondary | ICD-10-CM

## 2016-08-17 LAB — POCT INR
INR: 2.5
PT: 30.5

## 2016-08-17 NOTE — Patient Instructions (Signed)
Anticoagulation Dose Instructions as of 08/17/2016      Thomas [name] Tue Wed Thu Fri Sat   New Dose 5 mg 2.5 mg 5 mg 5 mg 5 mg 2.5 mg 5 mg    Description   Dx: Pulmonary Embolism (ICD-10  I26.99) Current dose: 5mg  QD except, 2.5mg  on Monday and Friday PT: 30.5 INR:   2.5 Changes made today:  NO CHANGE Recheck:  4 weeks

## 2016-09-16 ENCOUNTER — Ambulatory Visit (INDEPENDENT_AMBULATORY_CARE_PROVIDER_SITE_OTHER): Payer: Medicare Other | Admitting: Family Medicine

## 2016-09-16 DIAGNOSIS — I2699 Other pulmonary embolism without acute cor pulmonale: Secondary | ICD-10-CM | POA: Diagnosis not present

## 2016-09-16 LAB — POCT INR
INR: 2.4
PT: 28.5

## 2016-09-16 NOTE — Progress Notes (Signed)
PT ONLY

## 2016-09-16 NOTE — Patient Instructions (Addendum)
Anticoagulation Dose Instructions as of 09/16/2016      Dorene Grebe Tue Wed Thu Fri Sat   New Dose 5 mg 2.5 mg 5 mg 5 mg 5 mg 2.5 mg 5 mg    Description   Dx: Pulmonary Embolism (ICD-10  I26.99) Current dose: 5mg  QD except, 2.5mg  on Monday and Friday PT: 28.5 INR:   2.4 Changes made today:  NO CHANGE Recheck:  4 weeks

## 2016-10-16 ENCOUNTER — Ambulatory Visit (INDEPENDENT_AMBULATORY_CARE_PROVIDER_SITE_OTHER): Payer: Medicare Other | Admitting: Family Medicine

## 2016-10-16 DIAGNOSIS — I2699 Other pulmonary embolism without acute cor pulmonale: Secondary | ICD-10-CM

## 2016-10-16 LAB — POCT INR
INR: 2.2
PT: 26.2

## 2016-10-16 NOTE — Patient Instructions (Signed)
Anticoagulation Dose Instructions as of 10/16/2016      Thomas [name] Tue Wed Thu Fri Sat   New Dose 5 mg 2.5 mg 5 mg 5 mg 5 mg 2.5 mg 5 mg    Description   Dx: Pulmonary Embolism (ICD-10  I26.99) Current dose: 5mg  QD except, 2.5mg  on Monday and Friday PT: 26.2 INR:   2.2 Changes made today:  NO CHANGE Recheck:  4 weeks

## 2016-10-30 ENCOUNTER — Other Ambulatory Visit: Payer: Self-pay | Admitting: Family Medicine

## 2016-11-11 ENCOUNTER — Other Ambulatory Visit: Payer: Self-pay | Admitting: Family Medicine

## 2016-11-16 ENCOUNTER — Ambulatory Visit (INDEPENDENT_AMBULATORY_CARE_PROVIDER_SITE_OTHER): Payer: Medicare Other | Admitting: Family Medicine

## 2016-11-16 DIAGNOSIS — I2699 Other pulmonary embolism without acute cor pulmonale: Secondary | ICD-10-CM | POA: Diagnosis not present

## 2016-11-16 LAB — POCT INR
INR: 2.3
PT: 27.4

## 2016-11-16 NOTE — Patient Instructions (Signed)
Anticoagulation Dose Instructions as of 11/16/2016      Thomas [name] Tue Wed Thu Fri Sat   New Dose 5 mg 2.5 mg 5 mg 5 mg 5 mg 2.5 mg 5 mg    Description   Dx: Pulmonary Embolism (ICD-10  I26.99) Current dose: 5mg  QD except, 2.5mg  on Monday and Friday PT: 27.4 INR:   2.3 Changes made today:  NO CHANGE Recheck:  4 weeks

## 2016-12-14 ENCOUNTER — Ambulatory Visit (INDEPENDENT_AMBULATORY_CARE_PROVIDER_SITE_OTHER): Payer: Medicare Other | Admitting: Family Medicine

## 2016-12-14 DIAGNOSIS — I2699 Other pulmonary embolism without acute cor pulmonale: Secondary | ICD-10-CM

## 2016-12-14 LAB — POCT INR
INR: 2.7
PT: 32.8

## 2016-12-14 NOTE — Progress Notes (Signed)
Dx: Pulmonary Embolism (ICD-10  I26.99) Current dose: 5mg  QD except, 2.5mg  on Monday and Friday PT: 27.4 INR:   2.3 Changes made today:  NO CHANGE Recheck:  4 weeks

## 2016-12-14 NOTE — Patient Instructions (Signed)
Dx: Pulmonary Embolism (ICD-10  I26.99) Current dose: 5mg  QD except, 2.5mg  on Monday and Friday PT: 27.4 INR:   2.3 Changes made today:  NO CHANGE Recheck:  4 weeks

## 2016-12-23 ENCOUNTER — Other Ambulatory Visit: Payer: Self-pay | Admitting: Family Medicine

## 2016-12-23 DIAGNOSIS — Z86711 Personal history of pulmonary embolism: Secondary | ICD-10-CM

## 2017-01-18 ENCOUNTER — Ambulatory Visit (INDEPENDENT_AMBULATORY_CARE_PROVIDER_SITE_OTHER): Payer: Medicare Other | Admitting: Family Medicine

## 2017-01-18 DIAGNOSIS — I2699 Other pulmonary embolism without acute cor pulmonale: Secondary | ICD-10-CM | POA: Diagnosis not present

## 2017-01-18 LAB — POCT INR
INR: 2.6
PT: 30.9

## 2017-02-15 ENCOUNTER — Encounter: Payer: Self-pay | Admitting: Family Medicine

## 2017-02-15 ENCOUNTER — Ambulatory Visit (INDEPENDENT_AMBULATORY_CARE_PROVIDER_SITE_OTHER): Payer: Medicare Other | Admitting: Family Medicine

## 2017-02-15 VITALS — BP 122/62 | HR 62 | Temp 97.7°F | Resp 16 | Wt 182.0 lb

## 2017-02-15 DIAGNOSIS — Z86711 Personal history of pulmonary embolism: Secondary | ICD-10-CM

## 2017-02-15 DIAGNOSIS — I2699 Other pulmonary embolism without acute cor pulmonale: Secondary | ICD-10-CM

## 2017-02-15 DIAGNOSIS — D6859 Other primary thrombophilia: Secondary | ICD-10-CM | POA: Diagnosis not present

## 2017-02-15 DIAGNOSIS — E785 Hyperlipidemia, unspecified: Secondary | ICD-10-CM

## 2017-02-15 DIAGNOSIS — I10 Essential (primary) hypertension: Secondary | ICD-10-CM

## 2017-02-15 DIAGNOSIS — R7301 Impaired fasting glucose: Secondary | ICD-10-CM | POA: Diagnosis not present

## 2017-02-15 LAB — POCT INR
INR: 2.2
PT: 26.6

## 2017-02-15 NOTE — Progress Notes (Signed)
Patient: Thomas [name] Male    DOB: 1946-04-18   71 y.o.   MRN: [id number] Visit Date: 02/15/2017  Today's Provider: Lelon Huh, MD   Chief Complaint  Patient presents with  . Hypertension  . Hyperglycemia  . Hyperlipidemia   Subjective:    HPI  Hypertension, follow-up:  BP Readings from Last 3 Encounters:  01/07/16 132/78    He was last seen for hypertension 1 years ago.  BP at that visit was 132/78. Management since that visit includes no changes. He reports good compliance with treatment. He is not having side effects.  He is not exercising. He is adherent to low salt diet.   Outside blood pressures are not being checked. He is experiencing none.  Patient denies chest pain, chest pressure/discomfort, claudication, dyspnea, exertional chest pressure/discomfort, fatigue, irregular heart beat, lower extremity edema, near-syncope, orthopnea, palpitations, paroxysmal nocturnal dyspnea, syncope and tachypnea.   Cardiovascular risk factors include advanced age (older than 83 for men, 15 for women), hypertension and male gender.  Use of agents associated with hypertension: none.     Weight trend: stable Wt Readings from Last 3 Encounters:  01/07/16 179 lb (81.2 kg)    Current diet: well balanced  ------------------------------------------------------------------------  Hyperglycemia, Follow-up:   Lab Results  Component Value Date   HGBA1C 5.7 (H) 01/07/2016   GLUCOSE 101 (H) 01/07/2016    Last seen for for this 1 years ago.  Management since then includes no changes. Current symptoms include none and have been stable.  Weight trend: stable Prior visit with dietician: no Current diet: well balanced Current exercise: none  Pertinent Labs:    Component Value Date/Time   CHOL 108 01/07/2016 1432   TRIG 50 01/07/2016 1432   CHOLHDL 2.8 01/07/2016 1432   CREATININE 1.06 01/07/2016 1432    Wt Readings from Last 3 Encounters:  01/07/16 179 lb  (81.2 kg)    Lipid/Cholesterol, Follow-up:   Last seen for this1 years ago.  Management changes since that visit include no changes. . Last Lipid Panel:    Component Value Date/Time   CHOL 108 01/07/2016 1432   TRIG 50 01/07/2016 1432   HDL 38 (L) 01/07/2016 1432   CHOLHDL 2.8 01/07/2016 1432   LDLCALC 60 01/07/2016 1432    Risk factors for vascular disease include hypercholesterolemia and hypertension  He reports good compliance with treatment. He is not having side effects.  Current symptoms include none and have been stable. Weight trend: stable Prior visit with dietician: no Current diet: well balanced Current exercise: none  Wt Readings from Last 3 Encounters:  01/07/16 179 lb (81.2 kg)    ------------------------------------------------------------------- Coumadin Management:  Patient is here today for a recheck of PT/ INR.     Allergies  Allergen Reactions  . Dexamethasone Other (See Comments)    Renal Insufficiency     Current Outpatient Prescriptions:  .  amLODipine (NORVASC) 2.5 MG tablet, TAKE 1 TABLET BY MOUTH EVERY DAY, Disp: 30 tablet, Rfl: 12 .  atorvastatin (LIPITOR) 10 MG tablet, TAKE 1 TABLET BY MOUTH EVERY DAY, Disp: 30 tablet, Rfl: 3 .  warfarin (COUMADIN) 5 MG tablet, TAKE 1 TABLET BY MOUTH EVERY DAY, Disp: 30 tablet, Rfl: 5  Review of Systems  Constitutional: Negative for appetite change, chills and fever.  Respiratory: Negative for chest tightness, shortness of breath and wheezing.   Cardiovascular: Negative for chest pain and palpitations.  Gastrointestinal: Negative for abdominal pain, nausea and vomiting.  Endocrine:  Negative for cold intolerance, heat intolerance, polydipsia, polyphagia and polyuria.    Social History  Substance Use Topics  . Smoking status: Former Research scientist (life sciences)  . Smokeless tobacco: Never Used     Comment: started smokng at age 68  . Alcohol use 0.0 oz/week     Comment: rare   Objective:   BP 122/62 (BP Location:  Right Arm, Patient Position: Sitting, Cuff Size: Normal)   Pulse 62   Temp 97.7 F (36.5 C) (Oral)   Resp 16   Wt 182 lb (82.6 kg)   SpO2 97% Comment: room air  BMI 24.01 kg/m  There were no vitals filed for this visit.   Physical Exam   General Appearance:    Alert, cooperative, no distress  Eyes:    PERRL, conjunctiva/corneas clear, EOM's intact       Lungs:     Clear to auscultation bilaterally, respirations unlabored  Heart:    Regular rate and rhythm  Neurologic:   Awake, alert, oriented x 3. No apparent focal neurological           defect.       Results for orders placed or performed in visit on 02/15/17  POCT INR  Result Value Ref Range   INR 2.2    PT 26.6        Assessment & Plan:     1. Essential hypertension Well controlled.  Continue current medications.   - Renal function panel  2. Pulmonary embolism, other  - POCT INR - Lipid panel - Renal function panel - Hemoglobin A1c  3. Elevated fasting blood sugar  - Hemoglobin A1c  4. Healed or old pulmonary embolism   5. Hyperlipidemia, unspecified hyperlipidemia type He is tolerating atorvastatin well with no adverse effects.   - Lipid panel  6. Antithrombin 3 deficiency (HCC)  - POCT INR       Lelon Huh, MD  [facility]

## 2017-02-15 NOTE — Patient Instructions (Addendum)
Anticoagulation Dose Instructions as of 02/15/2017      Thomas [name] Tue Wed Thu Fri Sat   New Dose 5 mg 2.5 mg 5 mg 5 mg 5 mg 2.5 mg 5 mg    Description   Dx: Pulmonary Embolism (ICD-10  I26.99) Current dose: 5mg  QD except, 2.5mg  on Monday and Friday PT: 26.6 INR:   2.2 Changes made today:  NO CHANGE Recheck:  4 weeks     Screening for lung cancer is recommended for people between 85 and 71 years of age who have smoked at 1 pack per day or more for at least 30 years. Please call our office at [phone] to schedule a low dose CT lung scan for lung cancer screening.

## 2017-02-16 ENCOUNTER — Telehealth: Payer: Self-pay

## 2017-02-16 LAB — RENAL FUNCTION PANEL
Albumin: 4.7 g/dL (ref 3.5–4.8)
BUN/Creatinine Ratio: 12 (ref 10–24)
BUN: 11 mg/dL (ref 8–27)
CO2: 23 mmol/L (ref 18–29)
Calcium: 9.2 mg/dL (ref 8.6–10.2)
Chloride: 103 mmol/L (ref 96–106)
Creatinine, Ser: 0.94 mg/dL (ref 0.76–1.27)
GFR calc Af Amer: 95 mL/min/{1.73_m2} (ref >59)
GFR calc non Af Amer: 82 mL/min/{1.73_m2} (ref >59)
Glucose: 102 mg/dL — ABNORMAL HIGH (ref 65–99)
Phosphorus: 2.8 mg/dL (ref 2.5–4.5)
Potassium: 4 mmol/L (ref 3.5–5.2)
Sodium: 145 mmol/L — ABNORMAL HIGH (ref 134–144)

## 2017-02-16 LAB — HEMOGLOBIN A1C
Est. average glucose Bld gHb Est-mCnc: 114 mg/dL
Hgb A1c MFr Bld: 5.6 % (ref 4.8–5.6)

## 2017-02-16 LAB — LIPID PANEL
Chol/HDL Ratio: 2.8 ratio units (ref 0.0–5.0)
Cholesterol, Total: 117 mg/dL (ref 100–199)
HDL: 42 mg/dL (ref >39)
LDL Calculated: 65 mg/dL (ref 0–99)
Triglycerides: 48 mg/dL (ref 0–149)
VLDL Cholesterol Cal: 10 mg/dL (ref 5–40)

## 2017-02-16 NOTE — Telephone Encounter (Signed)
LMTCB  Thanks,  -Joseline 

## 2017-02-16 NOTE — Telephone Encounter (Signed)
Patient advised.

## 2017-02-16 NOTE — Telephone Encounter (Signed)
-----   Message from Birdie Sons, MD sent at 02/16/2017  7:50 AM EDT ----- Cholesterol well controlled at 117. Normal kidney functions and electrolytes. Continue current medications.  Check yearly. PT/INR in 1 months.

## 2017-03-08 NOTE — Progress Notes (Signed)
error 

## 2017-03-19 ENCOUNTER — Ambulatory Visit: Payer: Medicare Other | Admitting: Family Medicine

## 2017-03-19 ENCOUNTER — Ambulatory Visit (INDEPENDENT_AMBULATORY_CARE_PROVIDER_SITE_OTHER): Payer: Medicare Other | Admitting: Family Medicine

## 2017-03-19 DIAGNOSIS — I2699 Other pulmonary embolism without acute cor pulmonale: Secondary | ICD-10-CM

## 2017-03-19 LAB — POCT INR
INR: 2.8
PT: 33.6

## 2017-03-19 NOTE — Patient Instructions (Signed)
Continue current dose. 5 mg qd. 2.5 mg Mondays and Fridays. Return in 4 weeks.

## 2017-04-16 ENCOUNTER — Ambulatory Visit (INDEPENDENT_AMBULATORY_CARE_PROVIDER_SITE_OTHER): Payer: Medicare Other | Admitting: Emergency Medicine

## 2017-04-16 DIAGNOSIS — I2699 Other pulmonary embolism without acute cor pulmonale: Secondary | ICD-10-CM | POA: Diagnosis not present

## 2017-04-16 LAB — POCT INR
INR: 3.2
PT: 38.9

## 2017-04-16 NOTE — Patient Instructions (Signed)
Anticoagulation Warfarin Dose Instructions as of 04/16/2017      Dorene Grebe Tue Wed Thu Fri Sat   New Dose 5 mg 2.5 mg 5 mg 5 mg 5 mg Hold Hold    Description   Dx: Pulmonary Embolism (ICD-10  I26.99) Current dose 5 mg daily except for Mondays and Fridays 2.5 mg Changes made today: hold for 2 days then resume current dose.  Recheck:  4 weeks

## 2017-05-14 ENCOUNTER — Ambulatory Visit (INDEPENDENT_AMBULATORY_CARE_PROVIDER_SITE_OTHER): Payer: Medicare Other | Admitting: Family Medicine

## 2017-05-14 DIAGNOSIS — I2699 Other pulmonary embolism without acute cor pulmonale: Secondary | ICD-10-CM | POA: Diagnosis not present

## 2017-05-14 LAB — POCT INR
INR: 3.2
PT: 37.8

## 2017-05-14 NOTE — Patient Instructions (Signed)
Take 2.5 mg Monday, Wednesday, Friday. Take 5 mg all other days. Follow-up in 3 weeks.

## 2017-06-04 ENCOUNTER — Ambulatory Visit (INDEPENDENT_AMBULATORY_CARE_PROVIDER_SITE_OTHER): Payer: Medicare Other

## 2017-06-04 DIAGNOSIS — I2699 Other pulmonary embolism without acute cor pulmonale: Secondary | ICD-10-CM

## 2017-06-04 LAB — POCT INR
INR: 2.3
PT: 27

## 2017-06-04 NOTE — Progress Notes (Signed)
Anticoagulation Warfarin Dose Instructions as of 06/04/2017      Dorene Grebe Tue Wed Thu Fri Sat   New Dose 5 mg 2.5 mg 5 mg 2.5 mg 5 mg 2.5 mg 5 mg    Description   Continue current dose 2.5 mg Mon, Wed, and Friday and 5 mg all other days

## 2017-06-04 NOTE — Patient Instructions (Signed)
Anticoagulation Warfarin Dose Instructions as of 06/04/2017      Dorene Grebe Tue Wed Thu Fri Sat   New Dose 5 mg 2.5 mg 5 mg 2.5 mg 5 mg 2.5 mg 5 mg    Description   Continue current dose 2.5 mg Mon, Wed, and Friday and 5 mg all other days

## 2017-06-16 ENCOUNTER — Other Ambulatory Visit: Payer: Self-pay | Admitting: Family Medicine

## 2017-06-16 DIAGNOSIS — Z86711 Personal history of pulmonary embolism: Secondary | ICD-10-CM

## 2017-07-05 ENCOUNTER — Ambulatory Visit (INDEPENDENT_AMBULATORY_CARE_PROVIDER_SITE_OTHER): Payer: Medicare Other | Admitting: Family Medicine

## 2017-07-05 DIAGNOSIS — I2699 Other pulmonary embolism without acute cor pulmonale: Secondary | ICD-10-CM

## 2017-07-05 LAB — POCT INR
INR: 2.2
PT: 26.6

## 2017-07-22 ENCOUNTER — Telehealth: Payer: Self-pay | Admitting: Family Medicine

## 2017-08-06 ENCOUNTER — Ambulatory Visit (INDEPENDENT_AMBULATORY_CARE_PROVIDER_SITE_OTHER): Payer: Medicare Other

## 2017-08-06 DIAGNOSIS — I2699 Other pulmonary embolism without acute cor pulmonale: Secondary | ICD-10-CM | POA: Diagnosis not present

## 2017-08-06 LAB — POCT INR
INR: 2.2
PT: 26.5

## 2017-08-06 NOTE — Patient Instructions (Signed)
Anticoagulation Warfarin Dose Instructions as of 08/06/2017      Dorene Grebe Tue Wed Thu Fri Sat   New Dose 5 mg 2.5 mg 5 mg 2.5 mg 5 mg 2.5 mg 5 mg    Description    Dx: Pulmonary Embolism  I26.99 Current Coumadin dosage: 2.5mg  Mon, Wed and Friday, and 5mg  all other days PT:26.5 INR: 2.2 Today's changes: NO CHANGE Recheck: 4 weeks

## 2017-09-06 ENCOUNTER — Ambulatory Visit (INDEPENDENT_AMBULATORY_CARE_PROVIDER_SITE_OTHER): Payer: Medicare Other

## 2017-09-06 DIAGNOSIS — I2699 Other pulmonary embolism without acute cor pulmonale: Secondary | ICD-10-CM | POA: Diagnosis not present

## 2017-09-06 LAB — POCT INR
INR: 2.8
PT: 33.4

## 2017-09-06 NOTE — Patient Instructions (Signed)
Anticoagulation Warfarin Dose Instructions as of 09/06/2017      Dorene Grebe Tue Wed Thu Fri Sat   New Dose 5 mg 2.5 mg 5 mg 2.5 mg 5 mg 2.5 mg 5 mg    Description    Dx: Pulmonary Embolism Current Coumadin dose: 2.5mg  Mon, Wed and Friday, and 5mg  all other days PT: 33.4 INR: 2.8 Today's Changes: NO CHANGE Recheck: 4 weeks

## 2017-10-08 ENCOUNTER — Ambulatory Visit: Payer: Self-pay | Admitting: Family Medicine

## 2017-10-08 ENCOUNTER — Other Ambulatory Visit: Payer: Self-pay | Admitting: *Deleted

## 2017-10-08 DIAGNOSIS — I2699 Other pulmonary embolism without acute cor pulmonale: Secondary | ICD-10-CM

## 2017-10-09 LAB — PROTIME-INR
INR: 1.9 — ABNORMAL HIGH
Prothrombin Time: 20.1 s — ABNORMAL HIGH (ref 9.0–11.5)

## 2017-10-14 ENCOUNTER — Telehealth: Payer: Self-pay

## 2017-10-14 NOTE — Telephone Encounter (Signed)
See result note.  

## 2017-10-14 NOTE — Telephone Encounter (Signed)
I called patient regarding his sisters lab results and patient was asking about his PT/INR results? Do you anything about his results.  He had his INR/PT done on 11/09.  Thanks,  -Itzell Bendavid

## 2017-10-14 NOTE — Telephone Encounter (Signed)
Please advise pt's lab results. 

## 2017-10-19 ENCOUNTER — Other Ambulatory Visit: Payer: Self-pay | Admitting: Family Medicine

## 2017-10-29 ENCOUNTER — Telehealth: Payer: Self-pay | Admitting: Family Medicine

## 2017-11-08 ENCOUNTER — Ambulatory Visit: Payer: Medicare Other | Admitting: Family Medicine

## 2017-11-16 ENCOUNTER — Ambulatory Visit (INDEPENDENT_AMBULATORY_CARE_PROVIDER_SITE_OTHER): Payer: Medicare Other

## 2017-11-16 DIAGNOSIS — I2699 Other pulmonary embolism without acute cor pulmonale: Secondary | ICD-10-CM

## 2017-11-16 LAB — POCT INR
INR: 3
PT: 35.6

## 2017-11-16 NOTE — Patient Instructions (Signed)
Description    Dx: Pulmonary Embolism Current Coumadin dose: 2.5mg  Mon, Wed and Friday, and 5mg  all other days PT: 35.6 INR: 3.0 Today's Changes: NO CHANGE Recheck: 4 weeks

## 2017-11-17 NOTE — Telephone Encounter (Signed)
duplicate

## 2017-12-13 ENCOUNTER — Other Ambulatory Visit: Payer: Self-pay | Admitting: Family Medicine

## 2017-12-13 DIAGNOSIS — Z86711 Personal history of pulmonary embolism: Secondary | ICD-10-CM

## 2017-12-14 ENCOUNTER — Ambulatory Visit (INDEPENDENT_AMBULATORY_CARE_PROVIDER_SITE_OTHER): Payer: Medicare Other | Admitting: Family Medicine

## 2017-12-14 DIAGNOSIS — I2699 Other pulmonary embolism without acute cor pulmonale: Secondary | ICD-10-CM | POA: Diagnosis not present

## 2017-12-14 LAB — POCT INR
INR: 2
PT: 24.6

## 2017-12-14 NOTE — Patient Instructions (Signed)
Description   Dx: Pulmonary Embolism Current Coumadin dose: 5mg  everyday except, 2.5mg  on Monday, Wednesday and Friday PT: 24.6 INR: 2.0 Today's changes: NO CHANGES Recheck: 4 weeks

## 2018-01-17 ENCOUNTER — Ambulatory Visit (INDEPENDENT_AMBULATORY_CARE_PROVIDER_SITE_OTHER): Payer: Medicare Other | Admitting: Family Medicine

## 2018-01-17 DIAGNOSIS — I2699 Other pulmonary embolism without acute cor pulmonale: Secondary | ICD-10-CM | POA: Diagnosis not present

## 2018-01-17 LAB — POCT INR
INR: 2.2
PT: 26.8

## 2018-01-17 NOTE — Progress Notes (Signed)
Nurse Visit only. Coumadin Clinic

## 2018-01-17 NOTE — Patient Instructions (Signed)
Description   Dx: Pulmonary Embolism Current Coumadin Dose: 5mg  QD except, 2.5mg  on Monday,Wednesday and Friday PT:26.8 INR: 2.2 Today's Changes: NO CHANGE Recheck: 4 weeks

## 2018-02-14 ENCOUNTER — Ambulatory Visit (INDEPENDENT_AMBULATORY_CARE_PROVIDER_SITE_OTHER): Payer: Medicare Other

## 2018-02-14 DIAGNOSIS — I2699 Other pulmonary embolism without acute cor pulmonale: Secondary | ICD-10-CM

## 2018-02-14 LAB — POCT INR
INR: 2.2
PT: 26.7

## 2018-02-14 NOTE — Patient Instructions (Signed)
Description   Dx: Pulmonary Embolism Current coumadin dose: 5mg  QD, except 2.5mg  on Monday, Wednesday and Friday PT:26.7 INR: 2.2 Today's Changes: NO CHANGE Recheck: 4 weeks

## 2018-02-14 NOTE — Telephone Encounter (Signed)
Declined

## 2018-02-15 ENCOUNTER — Other Ambulatory Visit: Payer: Self-pay | Admitting: Family Medicine

## 2018-03-14 ENCOUNTER — Other Ambulatory Visit: Payer: Self-pay | Admitting: Family Medicine

## 2018-03-14 NOTE — Telephone Encounter (Signed)
I haven't seen patient in over a year, he needs to schedule o.v. Before I can approve prescription refills.

## 2018-03-16 ENCOUNTER — Ambulatory Visit (INDEPENDENT_AMBULATORY_CARE_PROVIDER_SITE_OTHER): Payer: Medicare Other

## 2018-03-16 ENCOUNTER — Other Ambulatory Visit: Payer: Self-pay | Admitting: *Deleted

## 2018-03-16 DIAGNOSIS — I2699 Other pulmonary embolism without acute cor pulmonale: Secondary | ICD-10-CM | POA: Diagnosis not present

## 2018-03-16 LAB — POCT INR
INR: 2.3
PT: 28

## 2018-03-16 MED ORDER — ATORVASTATIN CALCIUM 10 MG PO TABS: 10.0000 mg | ORAL_TABLET | Freq: Every day | ORAL | 0 refills | Status: DC

## 2018-03-16 NOTE — Patient Instructions (Signed)
Description   Dx: Pulmonary Embolism Current Coumadin dose: 5mg  QD, except 2.5mg  on Monday, Wednesday and Friday PT: 28.0 INR: 2.3 Today's Changes: NO CHANGE Recheck: 4 weeks

## 2018-04-15 ENCOUNTER — Encounter: Payer: Self-pay | Admitting: Family Medicine

## 2018-04-15 ENCOUNTER — Ambulatory Visit (INDEPENDENT_AMBULATORY_CARE_PROVIDER_SITE_OTHER): Payer: Medicare Other | Admitting: Family Medicine

## 2018-04-15 ENCOUNTER — Other Ambulatory Visit: Payer: Self-pay | Admitting: Family Medicine

## 2018-04-15 ENCOUNTER — Other Ambulatory Visit: Payer: Self-pay

## 2018-04-15 VITALS — BP 116/78 | Wt 175.0 lb

## 2018-04-15 DIAGNOSIS — Z86718 Personal history of other venous thrombosis and embolism: Secondary | ICD-10-CM | POA: Diagnosis not present

## 2018-04-15 DIAGNOSIS — I10 Essential (primary) hypertension: Secondary | ICD-10-CM | POA: Diagnosis not present

## 2018-04-15 DIAGNOSIS — E785 Hyperlipidemia, unspecified: Secondary | ICD-10-CM

## 2018-04-15 DIAGNOSIS — S0001XA Abrasion of scalp, initial encounter: Secondary | ICD-10-CM | POA: Diagnosis not present

## 2018-04-15 DIAGNOSIS — I2699 Other pulmonary embolism without acute cor pulmonale: Secondary | ICD-10-CM

## 2018-04-15 LAB — POCT INR
INR: 33.4
PT: 2.8

## 2018-04-15 NOTE — Progress Notes (Signed)
Patient: Thomas [name] Male    DOB: 04/30/1946   72 y.o.   MRN: [id number] Visit Date: 04/15/2018  Today's Provider: Lelon Huh, MD   Chief Complaint  Patient presents with  . Hypertension   Subjective:    HPI   Hypertension, follow-up:  BP Readings from Last 3 Encounters:  04/15/18 116/78  02/15/17 122/62  01/07/16 132/78    He was last seen for hypertension 02/15/2017.  BP at that visit was no changes. Management since that visit includes unchanged.He reports good compliance with treatment. He is not having side effects.  He is not exercising. He is not adherent to low salt diet.   Outside blood pressures are stable. He is experiencing none.  Patient denies none.   Cardiovascular risk factors include none.  Use of agents associated with hypertension: none.   ------------------------------------------------------------------------    Lipid/Cholesterol, Follow-up:   Last seen for this 02/15/2017.  Management since that visit includes; labs checked. No changes.  Last Lipid Panel:    Component Value Date/Time   CHOL 117 02/15/2017 1429   TRIG 48 02/15/2017 1429   HDL 42 02/15/2017 1429   CHOLHDL 2.8 02/15/2017 1429   LDLCALC 65 02/15/2017 1429    He reports good compliance with treatment. He is not having side effects.   Wt Readings from Last 3 Encounters:  04/15/18 175 lb (79.4 kg)  02/15/17 182 lb (82.6 kg)  01/07/16 179 lb (81.2 kg)    ------------------------------------------------------------------------  He also reports today that he injured his left ear a few years ago and it has never healed. Apparently a tree limb swept across back of ear and scraped off a big chunk of skin. Since then he has been keeping it bandaged and cleans it every day with alcohol. Despite occurring years ago he never sought medication evaluation and has never brought to my attention before.     Allergies  Allergen Reactions  . Dexamethasone Other (See  Comments)    Renal Insufficiency     Current Outpatient Medications:  .  amLODipine (NORVASC) 2.5 MG tablet, TAKE 1 TABLET BY MOUTH EVERY DAY, Disp: 30 tablet, Rfl: 3 .  atorvastatin (LIPITOR) 10 MG tablet, Take 1 tablet (10 mg total) by mouth daily., Disp: 30 tablet, Rfl: 0 .  warfarin (COUMADIN) 5 MG tablet, TAKE 1 TABLET BY MOUTH EVERY DAY, Disp: 30 tablet, Rfl: 5  Review of Systems  Constitutional: Negative for appetite change, chills and fever.  Respiratory: Negative for chest tightness, shortness of breath and wheezing.   Cardiovascular: Negative for chest pain and palpitations.  Gastrointestinal: Negative for abdominal pain, nausea and vomiting.    Social History   Tobacco Use  . Smoking status: Former Smoker    Packs/day: 1.00    Last attempt to quit: 2002    Years since quitting: 17.3  . Smokeless tobacco: Never Used  . Tobacco comment: started smokng at age 75  Substance Use Topics  . Alcohol use: Yes    Alcohol/week: 0.0 oz    Comment: rare   Objective:   BP 116/78 (BP Location: Left Arm, Patient Position: Sitting, Cuff Size: Normal)   Wt 175 lb (79.4 kg)   BMI 23.09 kg/m  Vitals:   04/15/18 1348  BP: 116/78  Weight: 175 lb (79.4 kg)     Physical Exam    General Appearance:    Alert, cooperative, no distress  Eyes:    PERRL, conjunctiva/corneas clear, EOM's intact  Lungs:     Clear to auscultation bilaterally, respirations unlabored  Heart:    Regular rate and rhythm  Neurologic:   Awake, alert, oriented x 3. No apparent focal neurological           defect.   ENT:   3-4 cm wide full thickness skin loss around posterior aspect of left auricle and scalp. Some serous drainage and scant amount of blood.        Assessment & Plan:     1. 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 H/O deep venous thrombosis Continue current dose of warfarin.   3. Hyperlipidemia, unspecified hyperlipidemia  type He is tolerating atorvastatin well with no adverse effects.   - Comprehensive metabolic panel - Lipid panel  4. Essential hypertension Well controlled.  Continue current medications.    5. Abrasion of scalp, initial encounter Old ull thickness wound is suprisingly clean with no signs of infection, but advised that it is not going to heal just applying alcohol. Will likely require skin graft. Will see if he can get in with plastic surgery to evaluate.  - Ambulatory referral to Plastic Surgery       Lelon Huh, MD  [facility]

## 2018-04-15 NOTE — Patient Instructions (Signed)
Continue current Warfarin dose.  Continue current dose 2.5 mg Mon, Wed, and Friday and 5 mg all other days

## 2018-04-18 DIAGNOSIS — E785 Hyperlipidemia, unspecified: Secondary | ICD-10-CM | POA: Diagnosis not present

## 2018-04-19 LAB — COMPREHENSIVE METABOLIC PANEL
ALT: 7 IU/L (ref 0–44)
AST: 14 IU/L (ref 0–40)
Albumin/Globulin Ratio: 1.4 (ref 1.2–2.2)
Albumin: 4.3 g/dL (ref 3.5–4.8)
Alkaline Phosphatase: 50 IU/L (ref 39–117)
BUN/Creatinine Ratio: 12 (ref 10–24)
BUN: 12 mg/dL (ref 8–27)
Bilirubin Total: 0.5 mg/dL (ref 0.0–1.2)
CO2: 21 mmol/L (ref 20–29)
Calcium: 9.3 mg/dL (ref 8.6–10.2)
Chloride: 104 mmol/L (ref 96–106)
Creatinine, Ser: 1.04 mg/dL (ref 0.76–1.27)
GFR calc Af Amer: 83 mL/min/{1.73_m2} (ref >59)
GFR calc non Af Amer: 72 mL/min/{1.73_m2} (ref >59)
Globulin, Total: 3 g/dL (ref 1.5–4.5)
Glucose: 90 mg/dL (ref 65–99)
Potassium: 4 mmol/L (ref 3.5–5.2)
Sodium: 142 mmol/L (ref 134–144)
Total Protein: 7.3 g/dL (ref 6.0–8.5)

## 2018-04-19 LAB — LIPID PANEL
Chol/HDL Ratio: 2.8 ratio (ref 0.0–5.0)
Cholesterol, Total: 106 mg/dL (ref 100–199)
HDL: 38 mg/dL — ABNORMAL LOW (ref >39)
LDL Calculated: 56 mg/dL (ref 0–99)
Triglycerides: 58 mg/dL (ref 0–149)
VLDL Cholesterol Cal: 12 mg/dL (ref 5–40)

## 2018-05-13 ENCOUNTER — Ambulatory Visit (INDEPENDENT_AMBULATORY_CARE_PROVIDER_SITE_OTHER): Payer: Medicare Other

## 2018-05-13 DIAGNOSIS — I2699 Other pulmonary embolism without acute cor pulmonale: Secondary | ICD-10-CM

## 2018-05-13 LAB — POCT INR
INR: 2.3 (ref 2.0–3.0)
PT: 27.4

## 2018-05-13 NOTE — Patient Instructions (Signed)
Description   Dx: Pulmonary Embolism Current Coumadin dose: 5mg  QD except 2.5mg  on Monday, Wednesday, Friday INR: 2.3 PT: 27.4 Today's Changes: NO CHANGE Recheck: 4 weeks

## 2018-06-15 ENCOUNTER — Other Ambulatory Visit: Payer: Self-pay | Admitting: Family Medicine

## 2018-06-15 ENCOUNTER — Ambulatory Visit (INDEPENDENT_AMBULATORY_CARE_PROVIDER_SITE_OTHER): Payer: Medicare Other

## 2018-06-15 DIAGNOSIS — I2699 Other pulmonary embolism without acute cor pulmonale: Secondary | ICD-10-CM

## 2018-06-15 DIAGNOSIS — Z86711 Personal history of pulmonary embolism: Secondary | ICD-10-CM

## 2018-06-15 LAB — POCT INR
INR: 2.6 (ref 2.0–3.0)
PT: 31.6

## 2018-06-15 NOTE — Patient Instructions (Signed)
Description   Dx: Pulmonary Embolism Current Coumadin dose: 5mg  QD except 2.5mg  on Monday, Wednesday, Friday INR: 2.6 PT: 31.6 Today's Changes: NO CHANGE Recheck: 4 weeks

## 2018-06-28 DIAGNOSIS — C44219 Basal cell carcinoma of skin of left ear and external auricular canal: Secondary | ICD-10-CM | POA: Diagnosis not present

## 2018-06-28 DIAGNOSIS — S01302A Unspecified open wound of left ear, initial encounter: Secondary | ICD-10-CM | POA: Diagnosis not present

## 2018-07-09 ENCOUNTER — Other Ambulatory Visit: Payer: Self-pay | Admitting: Family Medicine

## 2018-07-13 ENCOUNTER — Ambulatory Visit (INDEPENDENT_AMBULATORY_CARE_PROVIDER_SITE_OTHER): Payer: Medicare Other

## 2018-07-13 DIAGNOSIS — I2699 Other pulmonary embolism without acute cor pulmonale: Secondary | ICD-10-CM

## 2018-07-13 LAB — POCT INR
INR: 2.4 (ref 2.0–3.0)
PT: 28.5

## 2018-07-13 NOTE — Progress Notes (Signed)
Description   Dx: Pulmonary Embolism Current Coumadin dose: 5mg  QD, except 2.5mg  on Monday, Wednesday and Friday INR: 2.4 PT: 28.5 Today's Changes: NO CHANGE Recheck: 4 weeks

## 2018-07-18 DIAGNOSIS — S01302A Unspecified open wound of left ear, initial encounter: Secondary | ICD-10-CM | POA: Diagnosis not present

## 2018-07-18 DIAGNOSIS — C4491 Basal cell carcinoma of skin, unspecified: Secondary | ICD-10-CM | POA: Diagnosis not present

## 2018-08-17 ENCOUNTER — Ambulatory Visit (INDEPENDENT_AMBULATORY_CARE_PROVIDER_SITE_OTHER): Payer: Medicare Other | Admitting: Family Medicine

## 2018-08-17 DIAGNOSIS — I2699 Other pulmonary embolism without acute cor pulmonale: Secondary | ICD-10-CM

## 2018-08-17 LAB — POCT INR
INR: 2.7 (ref 2.0–3.0)
PT: 32.4

## 2018-08-17 NOTE — Patient Instructions (Signed)
Continue 5mg  daily except 2.5mg  M, W, F.  Recheck in four weeks.

## 2018-09-08 DIAGNOSIS — L578 Other skin changes due to chronic exposure to nonionizing radiation: Secondary | ICD-10-CM | POA: Diagnosis not present

## 2018-09-08 DIAGNOSIS — L988 Other specified disorders of the skin and subcutaneous tissue: Secondary | ICD-10-CM | POA: Diagnosis not present

## 2018-09-08 DIAGNOSIS — L814 Other melanin hyperpigmentation: Secondary | ICD-10-CM | POA: Diagnosis not present

## 2018-09-08 DIAGNOSIS — C44219 Basal cell carcinoma of skin of left ear and external auricular canal: Secondary | ICD-10-CM | POA: Diagnosis not present

## 2018-09-08 HISTORY — PX: MOHS SURGERY: SUR867

## 2018-09-09 ENCOUNTER — Encounter: Payer: Self-pay | Admitting: Family Medicine

## 2018-09-09 DIAGNOSIS — Z9889 Other specified postprocedural states: Secondary | ICD-10-CM

## 2018-09-09 DIAGNOSIS — Z85828 Personal history of other malignant neoplasm of skin: Secondary | ICD-10-CM | POA: Insufficient documentation

## 2018-09-14 ENCOUNTER — Ambulatory Visit (INDEPENDENT_AMBULATORY_CARE_PROVIDER_SITE_OTHER): Payer: Medicare Other

## 2018-09-14 DIAGNOSIS — I2699 Other pulmonary embolism without acute cor pulmonale: Secondary | ICD-10-CM

## 2018-09-14 LAB — POCT INR
INR: 2.6 (ref 2.0–3.0)
PT: 30.6

## 2018-09-14 NOTE — Patient Instructions (Signed)
Description   Dx: Pulmonary Embolism Current Coumadin Dose: 5mg  QD, except 2.5mg  on Monday, Wednesday and Friday PT: 30.6 INR: 2.6 Today's Changes: NO CHANGE Recheck: 4 weeks

## 2018-10-05 ENCOUNTER — Other Ambulatory Visit: Payer: Self-pay | Admitting: Family Medicine

## 2018-10-05 DIAGNOSIS — Z86711 Personal history of pulmonary embolism: Secondary | ICD-10-CM

## 2018-10-19 ENCOUNTER — Ambulatory Visit: Payer: Medicare Other | Admitting: Family Medicine

## 2018-10-19 DIAGNOSIS — I2699 Other pulmonary embolism without acute cor pulmonale: Secondary | ICD-10-CM

## 2018-10-19 LAB — POCT INR
INR: 3.2 — AB (ref 2.0–3.0)
PT: 37.9

## 2018-11-04 ENCOUNTER — Other Ambulatory Visit: Payer: Self-pay | Admitting: Family Medicine

## 2018-11-09 ENCOUNTER — Ambulatory Visit (INDEPENDENT_AMBULATORY_CARE_PROVIDER_SITE_OTHER): Payer: Medicare Other | Admitting: Family Medicine

## 2018-11-09 ENCOUNTER — Encounter: Payer: Self-pay | Admitting: Family Medicine

## 2018-11-09 DIAGNOSIS — I2699 Other pulmonary embolism without acute cor pulmonale: Secondary | ICD-10-CM

## 2018-11-09 LAB — POCT INR
INR: 2 (ref 2.0–3.0)
PT: 24.1

## 2018-11-09 MED ORDER — ENOXAPARIN SODIUM 40 MG/0.4ML ~~LOC~~ SOLN: 40.0000 mg | Freq: Every day | SUBCUTANEOUS | 0 refills | Status: DC

## 2018-11-09 NOTE — Patient Instructions (Addendum)
Pick up your prescription for Lovenox within the next week. Stop warfarin 7 days before your surgery. Your last dose of warfarin will be on Friday 11/18/2018 Return to office on 11/21/2018 to check PT/INR       If INR < 2.0 start Lovenox 40mg  SQ daily the same day   Take 1/2 dose of Lovenox (20mg ) the morning before your surgery (Thursday December 26th) DO NOT TAKE LOVENOX THE DAY OF YOUR SURGERY (Friday, December 27th)  Resume Lovenox 40mg  daily AND warfarin on Sunday morning December 29th, 2019 Check PT/INR Thursday, January 2nd 2020

## 2018-11-09 NOTE — Progress Notes (Signed)
Patient: Thomas [name] Male    DOB: 11/10/1946   72 y.o.   MRN: [id number] Visit Date: 11/09/2018  Today's Provider: Lelon Huh, MD   Chief Complaint  Patient presents with  . Medical Clearance   Subjective:      Pt is going to have reconstructive surgery to his left ear 11/25/2018 at [facility].  Pt had MOHs surgery 09/10/2018.  He has history of DVT PE and coagulation disorder and was recommend to be bridged with Lovenox perioperatively. He denies any abnormal bleeding or bruising. Has been stable on current warfarin dosage for extended period of time. Denies any chest pains or dyspnea.    Patient Active Problem List   Diagnosis Date Noted  . History of basal cell carcinoma (BCC) excision 09/09/2018  . 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 Airway hyperreactivity 05/03/2015  . 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  . Brash 05/03/2015  . H/O deep venous thrombosis 05/03/2015  . HLD (hyperlipidemia) 05/03/2015  . BP (high blood pressure) 05/03/2015  . Healed or old pulmonary embolism 05/03/2015  . Allergic rhinitis 03/21/2012     Hypertension  This is a chronic problem. The problem is unchanged. The problem is controlled. Pertinent negatives include no anxiety, blurred vision, chest pain, headaches, malaise/fatigue, neck pain, orthopnea, palpitations, peripheral edema, PND, shortness of breath or sweats. There are no associated agents to hypertension. There are no compliance problems.         Allergies  Allergen Reactions  . Dexamethasone Other (See Comments)    Renal Insufficiency     Current Outpatient Medications:  .  amLODipine (NORVASC) 2.5 MG tablet, TAKE 1 TABLET BY MOUTH EVERY DAY, Disp: 30 tablet, Rfl: 11 .  atorvastatin (LIPITOR) 10 MG tablet, TAKE 1 TABLET BY MOUTH EVERY DAY, Disp: 30 tablet, Rfl: 11 .  warfarin (COUMADIN) 5 MG tablet, TAKE 1 TABLET BY MOUTH EVERY DAY, Disp: 90 tablet, Rfl: 1  Review of Systems  Constitutional: Negative.  Negative  for malaise/fatigue.  HENT: Positive for sinus pressure and sinus pain.   Eyes: Negative for blurred vision.  Respiratory: Negative.  Negative for shortness of breath.   Cardiovascular: Negative.  Negative for chest pain, palpitations, orthopnea and PND.  Gastrointestinal: Negative.   Musculoskeletal: Negative for neck pain.  Neurological: Negative for dizziness, light-headedness and headaches.    Social History   Tobacco Use  . Smoking status: Former Smoker    Packs/day: 1.00    Last attempt to quit: 2002    Years since quitting: 17.9  . Smokeless tobacco: Never Used  . Tobacco comment: started smokng at age 39  Substance Use Topics  . Alcohol use: Yes    Alcohol/week: 0.0 standard drinks    Comment: rare   Objective:   BP 136/74 (BP Location: Right Arm, Patient Position: Sitting, Cuff Size: Normal)   Pulse 64   Temp 97.9 F (36.6 C) (Oral)   Resp 16   Wt 178 lb (80.7 kg)   BMI 23.48 kg/m   Physical Exam   General Appearance:    Alert, cooperative, no distress  Eyes:    PERRL, conjunctiva/corneas clear, EOM's intact       Lungs:     Clear to auscultation bilaterally, respirations unlabored  Heart:    Regular rate and rhythm  Neurologic:   Awake, alert, oriented x 3. No apparent focal neurological           defect.       Results for  orders placed or performed in visit on 11/09/18  POCT INR  Result Value Ref Range   INR 2.0 2.0 - 3.0   PT 24.1        Assessment & Plan:     Pulmonary embolism, other - Plan: POCT INR  Discusses pros and cons of bridging with Lovenox perioperatively. Consider history of PE with underlying coagulation disorder bridging may be of benefit. Reviewed plan for bridging as per patient instructions advised to not take warfarin after his dose on 11/18/2018. Given prescription for Lovenox syringes before he he returns of PT/INR check on 11/21/2018. His to bring medication with him to show him how to self administer medication   Over half  of this 25 minute visit were spent in counseling and coordinating care of multiple medical problems.       Lelon Huh, MD  [facility]

## 2018-11-11 ENCOUNTER — Telehealth: Payer: Self-pay | Admitting: Family Medicine

## 2018-11-11 NOTE — Telephone Encounter (Signed)
Left message on Thomas [name] with [facility] voicemail [phone] stating that patient is suppose to stop taking the warfarin on 11/18/2018 or 7 days before his surgery from a message in the patients chart.

## 2018-11-11 NOTE — Telephone Encounter (Signed)
Elray Mcgregor, RN w/ [facility] [phone] -can leave a message  Needing to verify pt has been taken off of the warfarin (COUMADIN) 5 MG tablet. For upcoming  surgery 28th.  Please advise.  Thanks, [employer]

## 2018-11-15 NOTE — Telephone Encounter (Signed)
Natalia, RN w. [facility] [phone]  Calling to confirm pt has stopped the coumadin and is on something else for upcoming surgery Dec. 27th.  Please advise.  Thanks, [employer]

## 2018-11-15 NOTE — Telephone Encounter (Signed)
Patient has been instructed to stop coumadin on 12-20 and will be coming in to check PT and start Lovenox bridge Monday or Tuesday

## 2018-11-15 NOTE — Telephone Encounter (Signed)
Please advise   Thanks,    -Kiley Torrence  

## 2018-11-15 NOTE — Telephone Encounter (Signed)
Please call Natalia, RN back to discuss.  Thanks, [employer]

## 2018-11-15 NOTE — Telephone Encounter (Signed)
Tried Customer service manager, RN with [employer]. I left a detailed message one her voice message system.

## 2018-11-17 DIAGNOSIS — Z85828 Personal history of other malignant neoplasm of skin: Secondary | ICD-10-CM | POA: Diagnosis not present

## 2018-11-18 ENCOUNTER — Telehealth: Payer: Self-pay | Admitting: Family Medicine

## 2018-11-18 NOTE — Telephone Encounter (Signed)
Left message on both contact numbers to call back.   Thanks,   -Mickel Baas

## 2018-11-18 NOTE — Telephone Encounter (Signed)
-----   Message from Birdie Sons, MD sent at 11/15/2018 11:59 AM EST ----- Regarding: make sure last dose of warfarin is on friday

## 2018-11-18 NOTE — Telephone Encounter (Signed)
Pt advised.   Thanks,   -Pat Sires  

## 2018-11-18 NOTE — Telephone Encounter (Signed)
Please remind patient it is time to stop taking warfarin in preparation for his surgery next week. If he usually takes it in the morning (before noon), he can take his last dose this morning. If he usually takes it in the afternoon or evening, then he should NOT take it tonight.

## 2018-11-21 ENCOUNTER — Ambulatory Visit (INDEPENDENT_AMBULATORY_CARE_PROVIDER_SITE_OTHER): Payer: Medicare Other | Admitting: Family Medicine

## 2018-11-21 DIAGNOSIS — I2699 Other pulmonary embolism without acute cor pulmonale: Secondary | ICD-10-CM

## 2018-11-21 LAB — POCT INR
INR: 1.3 — AB (ref 2.0–3.0)
PT: 15.6

## 2018-11-21 NOTE — Progress Notes (Signed)
Here for PT/INR bridge.  See pt instruction. Nurse visit only

## 2018-11-21 NOTE — Patient Instructions (Signed)
   Do NOT take warfarin this week.  Start Lovenox 40mg  SQ today (you were given today's dose in the office)  Inject 40mg  Lovenox on Tuesday and Wednesday this week  Injection 1/2 dose (20mg ) on Thursday December 26th   Do NOT inject Lovenox on Friday or Saturday  Take Lovenox AND Warfarin On Sunday, Monday, and Tuesday after your surgery  Take ONLY warfarin on Wednesday, January 1st   Check PT/INR Thursday January 2nd

## 2018-11-25 ENCOUNTER — Telehealth: Payer: Self-pay

## 2018-11-25 DIAGNOSIS — Z539 Procedure and treatment not carried out, unspecified reason: Secondary | ICD-10-CM | POA: Diagnosis not present

## 2018-11-25 DIAGNOSIS — C44219 Basal cell carcinoma of skin of left ear and external auricular canal: Secondary | ICD-10-CM | POA: Diagnosis not present

## 2018-11-25 NOTE — Telephone Encounter (Signed)
Patient called office back again asking to speak with CMA ASAP. KW

## 2018-11-25 NOTE — Telephone Encounter (Signed)
Patient request for CMA to give him a call back he would not say what is was in referance to. KW

## 2018-11-25 NOTE — Telephone Encounter (Signed)
Spoke with patient in regards to Lovenox and warfarin dose. Patient has an appt on 12/30 to recheck INR. Patient is to take Lovenox for the next 3 days until his appt. Patient verbally understands.

## 2018-11-28 ENCOUNTER — Ambulatory Visit (INDEPENDENT_AMBULATORY_CARE_PROVIDER_SITE_OTHER): Payer: Medicare Other

## 2018-11-28 DIAGNOSIS — I2699 Other pulmonary embolism without acute cor pulmonale: Secondary | ICD-10-CM | POA: Diagnosis not present

## 2018-11-28 LAB — POCT INR
INR: 1 — AB (ref 2.0–3.0)
PT: 12.2

## 2018-11-28 NOTE — Patient Instructions (Signed)
Description   Dx: Pulmonary Embolism Current Coumadin Dose: None PT: 15.6 INR: 1.3 Today's Changes: Continue to stay off Warfarin and continue Levonox 40mg  on Tue and Wed. Recheck 12/01/2018 Recheck: 12/01/2018

## 2018-11-28 NOTE — Progress Notes (Signed)
Patient remains on Lovenox bridge since resuming warfarin on 12-27. He is to continue current dose of warfarin, is to take Lovenox tomorrow and on on Wednesday 11/30/2018. He is NOT to take Lovenox 12/01/2018 before PT/INR is checked on that day. Anticipate stopping Lovenox if INR>=1.5

## 2018-12-01 ENCOUNTER — Ambulatory Visit (INDEPENDENT_AMBULATORY_CARE_PROVIDER_SITE_OTHER): Payer: Medicare Other | Admitting: Family Medicine

## 2018-12-01 DIAGNOSIS — I2699 Other pulmonary embolism without acute cor pulmonale: Secondary | ICD-10-CM | POA: Diagnosis not present

## 2018-12-01 LAB — POCT INR
INR: 1 — AB (ref 2.0–3.0)
Prothrombin Time: 12.3

## 2018-12-01 MED ORDER — ENOXAPARIN SODIUM 40 MG/0.4ML ~~LOC~~ SOLN: 40.0000 mg | Freq: Every day | SUBCUTANEOUS | 0 refills | Status: DC

## 2018-12-01 NOTE — Patient Instructions (Addendum)
   Take 40mg  of Lovenox AND 5mg  warfarin starting today   Continue BOTH medication on Friday, Saturday, Sunday, and Monday   ( Warfarin dose Today, Sat and Sun take 5 mg or whole pill.  On Friday and Monday take 2.5 mg or half pill)  Starting Tuesday, take ONLY the warfarin

## 2018-12-01 NOTE — Progress Notes (Signed)
Patient presents today stating he has not restarted warfarin since surgery was cancelled last week. He ran out of Lovenox yesterday, apparently there was a miscommunication and he was not aware that he is supposed to be taking warfarin.   He was advised to restart both warfarin and Lovenox today, continue through Monday January 6th, and to return on Tuesday January 7th to see if PT/INR is therapeutic.

## 2018-12-01 NOTE — Progress Notes (Signed)
Coumadin visit

## 2018-12-01 NOTE — Addendum Note (Signed)
Addended by: Minette Headland on: 12/01/2018 04:01 PM   Modules accepted: Orders

## 2018-12-06 ENCOUNTER — Ambulatory Visit (INDEPENDENT_AMBULATORY_CARE_PROVIDER_SITE_OTHER): Payer: Medicare Other | Admitting: Family Medicine

## 2018-12-06 DIAGNOSIS — I2699 Other pulmonary embolism without acute cor pulmonale: Secondary | ICD-10-CM

## 2018-12-06 LAB — POCT INR: INR: 1.2 — AB (ref 2.0–3.0)

## 2018-12-06 NOTE — Patient Instructions (Signed)
Continue current dose of 5mg  a day except 2.5mg  on Mondays and Fridays.  Can D/C Lovenox now.  Recheck in 3 weeks.

## 2018-12-26 ENCOUNTER — Ambulatory Visit (INDEPENDENT_AMBULATORY_CARE_PROVIDER_SITE_OTHER): Payer: Medicare Other | Admitting: Family Medicine

## 2018-12-26 DIAGNOSIS — I2699 Other pulmonary embolism without acute cor pulmonale: Secondary | ICD-10-CM | POA: Diagnosis not present

## 2018-12-26 LAB — POCT INR
INR: 2.4 (ref 2.0–3.0)
PT: 28.7

## 2018-12-26 NOTE — Patient Instructions (Signed)
Continue current dose of 5mg  a day except 2.5mg  Monday, Wednesday, and Friday.  Recheck in four weeks.

## 2019-01-25 NOTE — Patient Instructions (Addendum)
.   Please review the attached list of medications and notify my office if there are any errors.   . Please bring all of your medications to every appointment so we can make sure that our medication list is the same as yours.    No changes at this time continue to take 2.5 mg M, W, & F and 5 mg on all other days return in 1 month

## 2019-01-27 ENCOUNTER — Ambulatory Visit (INDEPENDENT_AMBULATORY_CARE_PROVIDER_SITE_OTHER): Payer: Medicare Other | Admitting: Family Medicine

## 2019-01-27 DIAGNOSIS — I2699 Other pulmonary embolism without acute cor pulmonale: Secondary | ICD-10-CM | POA: Diagnosis not present

## 2019-01-27 LAB — POCT INR
INR: 2 (ref 2.0–3.0)
Prothrombin Time: 24.4

## 2019-02-06 ENCOUNTER — Encounter: Payer: Self-pay | Admitting: Family Medicine

## 2019-03-03 ENCOUNTER — Ambulatory Visit (INDEPENDENT_AMBULATORY_CARE_PROVIDER_SITE_OTHER): Payer: Medicare Other

## 2019-03-03 ENCOUNTER — Other Ambulatory Visit: Payer: Self-pay

## 2019-03-03 DIAGNOSIS — I2699 Other pulmonary embolism without acute cor pulmonale: Secondary | ICD-10-CM | POA: Diagnosis not present

## 2019-03-03 LAB — POCT INR
INR: 2 (ref 2.0–3.0)
Prothrombin Time: 24.5

## 2019-03-03 NOTE — Patient Instructions (Signed)
Description   Dx: Pulmonary Embolism Continue current dose of 5mg  a day except 2.5mg  on Mondays, Wednesdays, and Fridays.   Recheck in four weeks.

## 2019-03-31 ENCOUNTER — Ambulatory Visit (INDEPENDENT_AMBULATORY_CARE_PROVIDER_SITE_OTHER): Payer: Medicare Other | Admitting: Family Medicine

## 2019-03-31 ENCOUNTER — Other Ambulatory Visit: Payer: Self-pay

## 2019-03-31 DIAGNOSIS — I2699 Other pulmonary embolism without acute cor pulmonale: Secondary | ICD-10-CM

## 2019-03-31 LAB — POCT INR
INR: 2 (ref 2.0–3.0)
PT: 24.3

## 2019-03-31 NOTE — Patient Instructions (Signed)
Continue 5mg  daily, except 2.5mg  M, W, F.  Recheck in four weeks.

## 2019-04-03 ENCOUNTER — Other Ambulatory Visit: Payer: Self-pay | Admitting: Family Medicine

## 2019-04-03 DIAGNOSIS — Z86711 Personal history of pulmonary embolism: Secondary | ICD-10-CM

## 2019-04-28 ENCOUNTER — Other Ambulatory Visit: Payer: Self-pay

## 2019-04-28 ENCOUNTER — Ambulatory Visit (INDEPENDENT_AMBULATORY_CARE_PROVIDER_SITE_OTHER): Payer: Medicare Other | Admitting: Family Medicine

## 2019-04-28 DIAGNOSIS — I2699 Other pulmonary embolism without acute cor pulmonale: Secondary | ICD-10-CM | POA: Diagnosis not present

## 2019-04-28 LAB — POCT INR
INR: 1.7 — AB (ref 2.0–3.0)
PT: 20.5

## 2019-04-28 NOTE — Patient Instructions (Signed)
Change to 5mg  a day except 2.5mg  M, F.  Recheck in three weeks.

## 2019-05-19 ENCOUNTER — Other Ambulatory Visit: Payer: Self-pay

## 2019-05-19 ENCOUNTER — Ambulatory Visit (INDEPENDENT_AMBULATORY_CARE_PROVIDER_SITE_OTHER): Payer: Medicare Other | Admitting: Physician Assistant

## 2019-05-19 DIAGNOSIS — I2699 Other pulmonary embolism without acute cor pulmonale: Secondary | ICD-10-CM | POA: Diagnosis not present

## 2019-05-19 LAB — POCT INR
INR: 2 (ref 2.0–3.0)
PT: 23.4

## 2019-05-19 NOTE — Patient Instructions (Signed)
Description   Dx: Pulmonary Embolism Change to 5mg  a day except 2.5mg  M, F.  Recheck in four weeks.

## 2019-06-16 ENCOUNTER — Ambulatory Visit (INDEPENDENT_AMBULATORY_CARE_PROVIDER_SITE_OTHER): Payer: Medicare Other | Admitting: Family Medicine

## 2019-06-16 ENCOUNTER — Other Ambulatory Visit: Payer: Self-pay

## 2019-06-16 DIAGNOSIS — Z86711 Personal history of pulmonary embolism: Secondary | ICD-10-CM | POA: Diagnosis not present

## 2019-06-16 DIAGNOSIS — Z86718 Personal history of other venous thrombosis and embolism: Secondary | ICD-10-CM

## 2019-06-16 LAB — POCT INR
INR: 2.3 (ref 2.0–3.0)
PT: 28.2

## 2019-06-16 NOTE — Patient Instructions (Signed)
Continue 5mg  a day except 2.5mg  on Monday and Friday.  Recheck in four weeks.

## 2019-07-18 ENCOUNTER — Other Ambulatory Visit: Payer: Self-pay

## 2019-07-18 ENCOUNTER — Ambulatory Visit (INDEPENDENT_AMBULATORY_CARE_PROVIDER_SITE_OTHER): Payer: Medicare Other | Admitting: Family Medicine

## 2019-07-18 DIAGNOSIS — Z86711 Personal history of pulmonary embolism: Secondary | ICD-10-CM | POA: Diagnosis not present

## 2019-07-18 DIAGNOSIS — Z86718 Personal history of other venous thrombosis and embolism: Secondary | ICD-10-CM | POA: Diagnosis not present

## 2019-07-18 LAB — POCT INR
INR: 1.8 — AB (ref 2.0–3.0)
PT: 21.1

## 2019-07-18 NOTE — Patient Instructions (Signed)
Take 5mg  daily except 2.5mg  on Mondays.  Recheck in three weeks.

## 2019-08-11 ENCOUNTER — Ambulatory Visit (INDEPENDENT_AMBULATORY_CARE_PROVIDER_SITE_OTHER): Payer: Medicare Other | Admitting: Family Medicine

## 2019-08-11 ENCOUNTER — Telehealth: Payer: Self-pay

## 2019-08-11 ENCOUNTER — Other Ambulatory Visit: Payer: Self-pay

## 2019-08-11 DIAGNOSIS — Z86711 Personal history of pulmonary embolism: Secondary | ICD-10-CM | POA: Diagnosis not present

## 2019-08-11 DIAGNOSIS — Z86718 Personal history of other venous thrombosis and embolism: Secondary | ICD-10-CM | POA: Diagnosis not present

## 2019-08-11 LAB — POCT INR
INR: 3.1 — AB (ref 2.0–3.0)
Prothrombin Time: 37.2

## 2019-08-11 NOTE — Patient Instructions (Addendum)
Description   Dx: Pulmonary Embolism  hold x2 days,then resume dose at  5mg  daily except 2.5mg  on Mondays.  Recheck in three weeks.

## 2019-08-11 NOTE — Telephone Encounter (Signed)
Clarified dosage with patient. KW

## 2019-08-11 NOTE — Telephone Encounter (Signed)
Patient called to clarify that he takes 2.5 mg of coumadin on Monday.

## 2019-08-16 DIAGNOSIS — H26493 Other secondary cataract, bilateral: Secondary | ICD-10-CM | POA: Diagnosis not present

## 2019-08-17 DIAGNOSIS — H5213 Myopia, bilateral: Secondary | ICD-10-CM | POA: Diagnosis not present

## 2019-09-04 ENCOUNTER — Ambulatory Visit (INDEPENDENT_AMBULATORY_CARE_PROVIDER_SITE_OTHER): Payer: Medicare Other | Admitting: Family Medicine

## 2019-09-04 ENCOUNTER — Other Ambulatory Visit: Payer: Self-pay

## 2019-09-04 DIAGNOSIS — Z86718 Personal history of other venous thrombosis and embolism: Secondary | ICD-10-CM

## 2019-09-04 DIAGNOSIS — Z86711 Personal history of pulmonary embolism: Secondary | ICD-10-CM | POA: Diagnosis not present

## 2019-09-04 LAB — POCT INR
INR: 3.3 — AB (ref 2.0–3.0)
Prothrombin Time: 39.7

## 2019-09-04 NOTE — Patient Instructions (Signed)
Description   Dx: Pulmonary Embolism  hold x2 days,then resume dose at  5mg  daily except 2.5mg  on Monday and Friday.  Recheck in three weeks.

## 2019-09-25 ENCOUNTER — Ambulatory Visit (INDEPENDENT_AMBULATORY_CARE_PROVIDER_SITE_OTHER): Payer: Medicare Other | Admitting: Family Medicine

## 2019-09-25 ENCOUNTER — Other Ambulatory Visit: Payer: Self-pay

## 2019-09-25 DIAGNOSIS — Z86718 Personal history of other venous thrombosis and embolism: Secondary | ICD-10-CM

## 2019-09-25 DIAGNOSIS — Z86711 Personal history of pulmonary embolism: Secondary | ICD-10-CM

## 2019-09-25 LAB — POCT INR
INR: 1.7 — AB (ref 2.0–3.0)
PT: 20.3

## 2019-09-25 NOTE — Patient Instructions (Signed)
Description   Dx: Pulmonary Embolism Current Coumadin dose: 5mg  qd except, 2.5mg  on Monday and Friday PT: 20.3 INR: 1.7 Today's changes:  NO CHANGE Recheck: 3 weeks

## 2019-09-27 DIAGNOSIS — H5213 Myopia, bilateral: Secondary | ICD-10-CM | POA: Diagnosis not present

## 2019-10-17 ENCOUNTER — Ambulatory Visit: Payer: Medicare Other | Admitting: Family Medicine

## 2019-10-17 ENCOUNTER — Telehealth: Payer: Self-pay

## 2019-10-17 ENCOUNTER — Other Ambulatory Visit: Payer: Self-pay

## 2019-10-17 NOTE — Telephone Encounter (Signed)
Patient came into the office today for his PT/INR appointment which  was scheduled for 1:40pm. Patient was asked  Screening questions for COVID  and answered yes to having new cough, sinus problems, runny nose. We advised patient that we would need to push his PT/INR appointment out until next week due to active symptoms. Patient became upset because he had to waste gas. I tried offering patient an appointment for next week, but patient became argumentative using profanity (curse words) and walked out of the office without rescheduling his appointment.

## 2019-10-23 ENCOUNTER — Telehealth: Payer: Self-pay

## 2019-10-23 NOTE — Telephone Encounter (Signed)
Clinic policy is that patients may switch to a new PCP within our office that is accepting new patients once.  If it does not work out with the second PCP, it is likely a cultural difference with the practice rather than the provider.  So it is okay to schedule him with me and switch PCP

## 2019-10-23 NOTE — Telephone Encounter (Signed)
Is this okay?  Thanks,   Mickel Baas    Copied from [place] [phone]. Topic: Appointment Scheduling - Scheduling Inquiry for Clinic >> Oct 23, 2019 12:22 PM Sheran Luz wrote: Patient would like to know if he is able to switch PCP from Dr. Caryn Section to Dr. Brita Romp.

## 2019-10-24 NOTE — Telephone Encounter (Signed)
OK to come in for PT clinic

## 2019-10-24 NOTE — Telephone Encounter (Signed)
Patient advised as below. Patient is requesting to come in the afternoon for PT/INR. Please advise.

## 2019-11-08 ENCOUNTER — Other Ambulatory Visit: Payer: Self-pay

## 2019-11-08 ENCOUNTER — Telehealth: Payer: Self-pay | Admitting: Family Medicine

## 2019-11-08 ENCOUNTER — Ambulatory Visit (INDEPENDENT_AMBULATORY_CARE_PROVIDER_SITE_OTHER): Payer: Medicare Other | Admitting: Family Medicine

## 2019-11-08 ENCOUNTER — Encounter: Payer: Self-pay | Admitting: Family Medicine

## 2019-11-08 VITALS — BP 129/70 | HR 66 | Temp 98.2°F | Ht 71.5 in | Wt 173.0 lb

## 2019-11-08 DIAGNOSIS — E785 Hyperlipidemia, unspecified: Secondary | ICD-10-CM

## 2019-11-08 DIAGNOSIS — I10 Essential (primary) hypertension: Secondary | ICD-10-CM

## 2019-11-08 DIAGNOSIS — Z23 Encounter for immunization: Secondary | ICD-10-CM

## 2019-11-08 DIAGNOSIS — Z86718 Personal history of other venous thrombosis and embolism: Secondary | ICD-10-CM | POA: Diagnosis not present

## 2019-11-08 DIAGNOSIS — Z86711 Personal history of pulmonary embolism: Secondary | ICD-10-CM

## 2019-11-08 LAB — COAGUCHEK XS/INR WAIVED
INR: 2.1 — ABNORMAL HIGH (ref 0.9–1.1)
Prothrombin Time: 25.3 s

## 2019-11-08 MED ORDER — WARFARIN SODIUM 5 MG PO TABS: 5.0000 mg | ORAL_TABLET | Freq: Every day | ORAL | 1 refills | Status: DC

## 2019-11-08 MED ORDER — ATORVASTATIN CALCIUM 10 MG PO TABS: 10.0000 mg | ORAL_TABLET | Freq: Every day | ORAL | 1 refills | Status: DC

## 2019-11-08 MED ORDER — AMLODIPINE BESYLATE 2.5 MG PO TABS: 2.5000 mg | ORAL_TABLET | Freq: Every day | ORAL | 1 refills | Status: DC

## 2019-11-08 NOTE — Progress Notes (Signed)
BP 129/70   Pulse 66   Temp 98.2 F (36.8 C) (Oral)   Ht 5' 11.5" (1.816 m)   Wt 173 lb (78.5 kg)   SpO2 97%   BMI 23.79 kg/m    Subjective:    Patient ID: Thomas [name], male    DOB: November 20, 1946, 73 y.o.   MRN: [id number]  HPI: Thomas [name] is a 73 y.o. male  Chief Complaint  Patient presents with  . Establish Care    pt would like to discuss about PT/INR   Patient presenting today to establish care.   8 years ago had a DVT in LLE. Currently on coumadin, M and F takes 2.5 mg, all other days 5 mg. Has been stable on this regimen for quite some time. Denies bleeding or bruising issues, new medications, diet changes.   HTN - does not check BPs at home. Tolerating medicine well without side effects. Denies CP, SOB, HAs, dizziness.   HLD - taking lipitor, tolerating well without myalgias, claudication. Trying to stay active, does not follow specific diet.   Relevant past medical, surgical, family and social history reviewed and updated as indicated. Interim medical history since our last visit reviewed. Allergies and medications reviewed and updated.  Review of Systems  Per HPI unless specifically indicated above     Objective:    BP 129/70   Pulse 66   Temp 98.2 F (36.8 C) (Oral)   Ht 5' 11.5" (1.816 m)   Wt 173 lb (78.5 kg)   SpO2 97%   BMI 23.79 kg/m   Wt Readings from Last 3 Encounters:  11/08/19 173 lb (78.5 kg)  11/09/18 178 lb (80.7 kg)  04/15/18 175 lb (79.4 kg)    Physical Exam Vitals and nursing note reviewed.  Constitutional:      Appearance: Normal appearance.  HENT:     Head: Atraumatic.  Eyes:     Extraocular Movements: Extraocular movements intact.     Conjunctiva/sclera: Conjunctivae normal.  Cardiovascular:     Rate and Rhythm: Normal rate and regular rhythm.  Pulmonary:     Effort: Pulmonary effort is normal.     Breath sounds: Normal breath sounds.  Musculoskeletal:        General: Normal range of motion.     Cervical back:  Normal range of motion and neck supple.  Skin:    General: Skin is warm and dry.  Neurological:     General: No focal deficit present.     Mental Status: He is oriented to person, place, and time.  Psychiatric:        Mood and Affect: Mood normal.        Thought Content: Thought content normal.        Judgment: Judgment normal.     Results for orders placed or performed in visit on 11/08/19  CBC with Differential/Platelet out  Result Value Ref Range   WBC 9.1 3.4 - 10.8 x10E3/uL   RBC 5.07 4.14 - 5.80 x10E6/uL   Hemoglobin 14.0 13.0 - 17.7 g/dL   Hematocrit 42.8 37.5 - 51.0 %   MCV 84 79 - 97 fL   MCH 27.6 26.6 - 33.0 pg   MCHC 32.7 31.5 - 35.7 g/dL   RDW 12.7 11.6 - 15.4 %   Platelets 202 150 - 450 x10E3/uL   Neutrophils 70 Not Estab. %   Lymphs 22 Not Estab. %   Monocytes 5 Not Estab. %   Eos 2 Not Estab. %  Basos 1 Not Estab. %   Neutrophils Absolute 6.4 1.4 - 7.0 x10E3/uL   Lymphocytes Absolute 2.0 0.7 - 3.1 x10E3/uL   Monocytes Absolute 0.5 0.1 - 0.9 x10E3/uL   EOS (ABSOLUTE) 0.2 0.0 - 0.4 x10E3/uL   Basophils Absolute 0.1 0.0 - 0.2 x10E3/uL   Immature Granulocytes 0 Not Estab. %   Immature Grans (Abs) 0.0 0.0 - 0.1 x10E3/uL  Comprehensive metabolic panel  Result Value Ref Range   Glucose 98 65 - 99 mg/dL   BUN 15 8 - 27 mg/dL   Creatinine, Ser 0.99 0.76 - 1.27 mg/dL   GFR calc non Af Amer 75 >59 mL/min/1.73   GFR calc Af Amer 87 >59 mL/min/1.73   BUN/Creatinine Ratio 15 10 - 24   Sodium 143 134 - 144 mmol/L   Potassium 4.2 3.5 - 5.2 mmol/L   Chloride 105 96 - 106 mmol/L   CO2 22 20 - 29 mmol/L   Calcium 9.4 8.6 - 10.2 mg/dL   Total Protein 7.2 6.0 - 8.5 g/dL   Albumin 4.7 3.7 - 4.7 g/dL   Globulin, Total 2.5 1.5 - 4.5 g/dL   Albumin/Globulin Ratio 1.9 1.2 - 2.2   Bilirubin Total 0.4 0.0 - 1.2 mg/dL   Alkaline Phosphatase 61 39 - 117 IU/L   AST 18 0 - 40 IU/L   ALT 8 0 - 44 IU/L  Lipid Panel w/o Chol/HDL Ratio out  Result Value Ref Range    Cholesterol, Total 105 100 - 199 mg/dL   Triglycerides 64 0 - 149 mg/dL   HDL 37 (L) >39 mg/dL   VLDL Cholesterol Cal 14 5 - 40 mg/dL   LDL Chol Calc (NIH) 54 0 - 99 mg/dL  CoaguChek XS/INR Waived  Result Value Ref Range   INR 2.1 (H) 0.9 - 1.1   Prothrombin Time 25.3 sec      Assessment & Plan:   Problem List Items Addressed This Visit      Cardiovascular and Mediastinum   Essential hypertension    BPs stable and under good control, continue current regimen      Relevant Orders   CBC with Differential/Platelet out (Completed)   Comprehensive metabolic panel (Completed)     Other   H/O deep venous thrombosis    Due for INR, will check and adjust if needed. Continue current regimen      Relevant Orders   CoaguChek XS/INR Waived (Completed)   HLD (hyperlipidemia)    Recheck lipids, adjust as needed. Continue current regimen      Relevant Orders   Lipid Panel w/o Chol/HDL Ratio out (Completed)    Other Visit Diagnoses    Flu vaccine need    -  Primary   Relevant Orders   Flu Vaccine QUAD High Dose(Fluad) (Completed)       Follow up plan: Return in about 4 weeks (around 12/06/2019) for INR.

## 2019-11-08 NOTE — Telephone Encounter (Signed)
amLODipine (NORVASC) 2.5 MG tablet  atorvastatin (LIPITOR) 10 MG tablet  warfarin (COUMADIN) 5 MG tablet     Patient requesting refills 90 day supply.    [id number] - [place], [place] - [address]

## 2019-11-09 LAB — CBC WITH DIFFERENTIAL/PLATELET
Basophils Absolute: 0.1 10*3/uL (ref 0.0–0.2)
Basos: 1 %
EOS (ABSOLUTE): 0.2 10*3/uL (ref 0.0–0.4)
Eos: 2 %
Hematocrit: 42.8 % (ref 37.5–51.0)
Hemoglobin: 14 g/dL (ref 13.0–17.7)
Immature Grans (Abs): 0 10*3/uL (ref 0.0–0.1)
Immature Granulocytes: 0 %
Lymphocytes Absolute: 2 10*3/uL (ref 0.7–3.1)
Lymphs: 22 %
MCH: 27.6 pg (ref 26.6–33.0)
MCHC: 32.7 g/dL (ref 31.5–35.7)
MCV: 84 fL (ref 79–97)
Monocytes Absolute: 0.5 10*3/uL (ref 0.1–0.9)
Monocytes: 5 %
Neutrophils Absolute: 6.4 10*3/uL (ref 1.4–7.0)
Neutrophils: 70 %
Platelets: 202 10*3/uL (ref 150–450)
RBC: 5.07 x10E6/uL (ref 4.14–5.80)
RDW: 12.7 % (ref 11.6–15.4)
WBC: 9.1 10*3/uL (ref 3.4–10.8)

## 2019-11-09 LAB — COMPREHENSIVE METABOLIC PANEL
ALT: 8 IU/L (ref 0–44)
AST: 18 IU/L (ref 0–40)
Albumin/Globulin Ratio: 1.9 (ref 1.2–2.2)
Albumin: 4.7 g/dL (ref 3.7–4.7)
Alkaline Phosphatase: 61 IU/L (ref 39–117)
BUN/Creatinine Ratio: 15 (ref 10–24)
BUN: 15 mg/dL (ref 8–27)
Bilirubin Total: 0.4 mg/dL (ref 0.0–1.2)
CO2: 22 mmol/L (ref 20–29)
Calcium: 9.4 mg/dL (ref 8.6–10.2)
Chloride: 105 mmol/L (ref 96–106)
Creatinine, Ser: 0.99 mg/dL (ref 0.76–1.27)
GFR calc Af Amer: 87 mL/min/{1.73_m2} (ref >59)
GFR calc non Af Amer: 75 mL/min/{1.73_m2} (ref >59)
Globulin, Total: 2.5 g/dL (ref 1.5–4.5)
Glucose: 98 mg/dL (ref 65–99)
Potassium: 4.2 mmol/L (ref 3.5–5.2)
Sodium: 143 mmol/L (ref 134–144)
Total Protein: 7.2 g/dL (ref 6.0–8.5)

## 2019-11-09 LAB — LIPID PANEL W/O CHOL/HDL RATIO
Cholesterol, Total: 105 mg/dL (ref 100–199)
HDL: 37 mg/dL — ABNORMAL LOW (ref >39)
LDL Chol Calc (NIH): 54 mg/dL (ref 0–99)
Triglycerides: 64 mg/dL (ref 0–149)
VLDL Cholesterol Cal: 14 mg/dL (ref 5–40)

## 2019-11-09 NOTE — Telephone Encounter (Signed)
RXs sent.

## 2019-11-12 NOTE — Assessment & Plan Note (Signed)
Due for INR, will check and adjust if needed. Continue current regimen

## 2019-11-12 NOTE — Assessment & Plan Note (Signed)
BPs stable and under good control, continue current regimen 

## 2019-11-12 NOTE — Assessment & Plan Note (Signed)
Recheck lipids, adjust as needed. Continue current regimen 

## 2019-12-06 ENCOUNTER — Encounter: Payer: Self-pay | Admitting: Family Medicine

## 2019-12-06 ENCOUNTER — Ambulatory Visit (INDEPENDENT_AMBULATORY_CARE_PROVIDER_SITE_OTHER): Payer: Medicare Other | Admitting: Family Medicine

## 2019-12-06 ENCOUNTER — Other Ambulatory Visit: Payer: Self-pay

## 2019-12-06 VITALS — BP 138/80 | HR 65 | Temp 98.3°F | Ht 71.5 in | Wt 173.0 lb

## 2019-12-06 DIAGNOSIS — Z86718 Personal history of other venous thrombosis and embolism: Secondary | ICD-10-CM

## 2019-12-06 LAB — COAGUCHEK XS/INR WAIVED
INR: 2.3 — ABNORMAL HIGH (ref 0.9–1.1)
Prothrombin Time: 27.1 s

## 2019-12-06 NOTE — Progress Notes (Signed)
   BP 138/80   Pulse 65   Temp 98.3 F (36.8 C) (Oral)   Ht 5' 11.5" (1.816 m)   Wt 173 lb (78.5 kg)   SpO2 97%   BMI 23.79 kg/m    Subjective:    Patient ID: Thomas [name], male    DOB: [birth year], 74 y.o.   MRN: [id number]  HPI: Thomas [name] is a 74 y.o. male  Chief Complaint  Patient presents with  . 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 Currently taking 2.5 mg M and F, all other days 5 mg on his coumadin. Denies bleeding or bruising issues, CP, SOB, new medications or diet changes. Last INR was within range at 2.1.   Relevant past medical, surgical, family and social history reviewed and updated as indicated. Interim medical history since our last visit reviewed. Allergies and medications reviewed and updated.  Review of Systems  Per HPI unless specifically indicated above     Objective:    BP 138/80   Pulse 65   Temp 98.3 F (36.8 C) (Oral)   Ht 5' 11.5" (1.816 m)   Wt 173 lb (78.5 kg)   SpO2 97%   BMI 23.79 kg/m   Wt Readings from Last 3 Encounters:  12/06/19 173 lb (78.5 kg)  11/08/19 173 lb (78.5 kg)  11/09/18 178 lb (80.7 kg)    Physical Exam Vitals and nursing note reviewed.  Constitutional:      Appearance: Normal appearance.  HENT:     Head: Atraumatic.  Eyes:     Extraocular Movements: Extraocular movements intact.     Conjunctiva/sclera: Conjunctivae normal.  Cardiovascular:     Rate and Rhythm: Normal rate.  Pulmonary:     Effort: Pulmonary effort is normal.     Breath sounds: Normal breath sounds.  Musculoskeletal:        General: Normal range of motion.     Cervical back: Normal range of motion and neck supple.  Skin:    General: Skin is warm and dry.  Neurological:     General: No focal deficit present.     Mental Status: He is oriented to person, place, and time.  Psychiatric:        Mood and Affect: Mood normal.        Thought Content:  Thought content normal.        Judgment: Judgment normal.     Results for orders placed or performed in visit on 12/06/19  CoaguChek XS/INR Waived (STAT)  Result Value Ref Range   INR 2.3 (H) 0.9 - 1.1   Prothrombin Time 27.1 sec      Assessment & Plan:   Problem List Items Addressed This Visit      Other   H/O deep venous thrombosis - Primary    INR stable at 2.3. Continue current regimen      Relevant Orders   CoaguChek XS/INR Waived (STAT) (Completed)       Follow up plan: Return in about 4 weeks (around 01/03/2020) for INR.

## 2019-12-10 NOTE — Assessment & Plan Note (Signed)
INR stable at 2.3. Continue current regimen

## 2020-01-05 ENCOUNTER — Ambulatory Visit (INDEPENDENT_AMBULATORY_CARE_PROVIDER_SITE_OTHER): Payer: Medicare Other | Admitting: Family Medicine

## 2020-01-05 ENCOUNTER — Encounter: Payer: Self-pay | Admitting: Family Medicine

## 2020-01-05 ENCOUNTER — Other Ambulatory Visit: Payer: Self-pay

## 2020-01-05 VITALS — BP 135/80 | HR 61 | Temp 98.0°F | Ht 71.5 in | Wt 171.0 lb

## 2020-01-05 DIAGNOSIS — Z86718 Personal history of other venous thrombosis and embolism: Secondary | ICD-10-CM | POA: Diagnosis not present

## 2020-01-05 LAB — COAGUCHEK XS/INR WAIVED
INR: 2.3 — ABNORMAL HIGH (ref 0.9–1.1)
Prothrombin Time: 27.9 s

## 2020-01-05 NOTE — Progress Notes (Signed)
   BP 135/80   Pulse 61   Temp 98 F (36.7 C) (Oral)   Ht 5' 11.5" (1.816 m)   Wt 171 lb (77.6 kg)   SpO2 99%   BMI 23.52 kg/m    Subjective:    Patient ID: [name], male    DOB: [birth year], 74 y.o.   MRN: [id number]  HPI: JASKARAN LINEBERGER is a 74 y.o. male  Chief Complaint  Patient presents with  . Coagulation Disorder   Patient presenting today for INR f/u. 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 INR goal 2.0-3.0. Currently taking 2.5 mg M and F, all other days 5 mg coumadin. Denies bleeding or bruising isues, CP, SOB, med or diet changes.   Relevant past medical, surgical, family and social history reviewed and updated as indicated. Interim medical history since our last visit reviewed. Allergies and medications reviewed and updated.  Review of Systems  Per HPI unless specifically indicated above     Objective:    BP 135/80   Pulse 61   Temp 98 F (36.7 C) (Oral)   Ht 5' 11.5" (1.816 m)   Wt 171 lb (77.6 kg)   SpO2 99%   BMI 23.52 kg/m   Wt Readings from Last 3 Encounters:  01/05/20 171 lb (77.6 kg)  12/06/19 173 lb (78.5 kg)  11/08/19 173 lb (78.5 kg)    Physical Exam Vitals and nursing note reviewed.  Constitutional:      Appearance: Normal appearance.  HENT:     Head: Atraumatic.  Eyes:     Extraocular Movements: Extraocular movements intact.     Conjunctiva/sclera: Conjunctivae normal.  Cardiovascular:     Rate and Rhythm: Normal rate and regular rhythm.  Pulmonary:     Effort: Pulmonary effort is normal.     Breath sounds: Normal breath sounds.  Musculoskeletal:        General: Normal range of motion.     Cervical back: Normal range of motion and neck supple.  Skin:    General: Skin is warm and dry.  Neurological:     General: No focal deficit present.     Mental Status: He is oriented to person, place, and time.  Psychiatric:        Mood and Affect: Mood normal.        Thought Content: Thought content normal.      Judgment: Judgment normal.     Results for orders placed or performed in visit on 01/05/20  CoaguChek XS/INR Waived  Result Value Ref Range   INR 2.3 (H) 0.9 - 1.1   Prothrombin Time 27.9 sec      Assessment & Plan:   Problem List Items Addressed This Visit      Other   H/O deep venous thrombosis - Primary    INR at goal today at 2.3, continue current regimen and recheck in 1 month      Relevant Orders   CoaguChek XS/INR Waived (Completed)       Follow up plan: Return in about 4 weeks (around 02/02/2020) for INR.

## 2020-01-08 ENCOUNTER — Ambulatory Visit (INDEPENDENT_AMBULATORY_CARE_PROVIDER_SITE_OTHER): Payer: Medicare Other

## 2020-01-08 DIAGNOSIS — Z0000 Encounter for general adult medical examination without abnormal findings: Secondary | ICD-10-CM | POA: Diagnosis not present

## 2020-01-08 NOTE — Patient Instructions (Signed)
Thomas [name] , Thank you for taking time to come for your Medicare Wellness Visit. I appreciate your ongoing commitment to your health goals. Please review the following plan we discussed and let me know if I can assist you in the future.   Screening recommendations/referrals: Colonoscopy: declined Recommended yearly ophthalmology/optometry visit for glaucoma screening and checkup Recommended yearly dental visit for hygiene and checkup  Vaccinations: Influenza vaccine: up to date  Pneumococcal vaccine: up to date  Tdap vaccine: due now Shingles vaccine: shingrix eligible    Advanced directives: .Advance directive discussed with you today. Once this is complete please bring a copy in to our office so we can scan it into your chart.  Conditions/risks identified: none  Next appointment: Follow up in one year for your annual wellness visit   Preventive Care 74 Years and Older, Male Preventive care refers to lifestyle choices and visits with your health care provider that can promote health and wellness. What does preventive care include?  A yearly physical exam. This is also called an annual well check.  Dental exams once or twice a year.  Routine eye exams. Ask your health care provider how often you should have your eyes checked.  Personal lifestyle choices, including:  Daily care of your teeth and gums.  Regular physical activity.  Eating a healthy diet.  Avoiding tobacco and drug use.  Limiting alcohol use.  Practicing safe sex.  Taking low doses of aspirin every day.  Taking vitamin and mineral supplements as recommended by your health care provider. What happens during an annual well check? The services and screenings done by your health care provider during your annual well check will depend on your age, overall health, lifestyle risk factors, and family history of disease. Counseling  Your health care provider may ask you questions about your:  Alcohol  use.  Tobacco use.  Drug use.  Emotional well-being.  Home and relationship well-being.  Sexual activity.  Eating habits.  History of falls.  Memory and ability to understand (cognition).  Work and work Statistician. Screening  You may have the following tests or measurements:  Height, weight, and BMI.  Blood pressure.  Lipid and cholesterol levels. These may be checked every 5 years, or more frequently if you are over 66 years old.  Skin check.  Lung cancer screening. You may have this screening every year starting at age 65 if you have a 30-pack-year history of smoking and currently smoke or have quit within the past 15 years.  Fecal occult blood test (FOBT) of the stool. You may have this test every year starting at age 76.  Flexible sigmoidoscopy or colonoscopy. You may have a sigmoidoscopy every 5 years or a colonoscopy every 10 years starting at age 85.  Prostate cancer screening. Recommendations will vary depending on your family history and other risks.  Hepatitis C blood test.  Hepatitis B blood test.  Sexually transmitted disease (STD) testing.  Diabetes screening. This is done by checking your blood sugar (glucose) after you have not eaten for a while (fasting). You may have this done every 1-3 years.  Abdominal aortic aneurysm (AAA) screening. You may need this if you are a current or former smoker.  Osteoporosis. You may be screened starting at age 58 if you are at high risk. Talk with your health care provider about your test results, treatment options, and if necessary, the need for more tests. Vaccines  Your health care provider may recommend certain vaccines, such as:  Influenza vaccine. This is recommended every year.  Tetanus, diphtheria, and acellular pertussis (Tdap, Td) vaccine. You may need a Td booster every 10 years.  Zoster vaccine. You may need this after age 19.  Pneumococcal 13-valent conjugate (PCV13) vaccine. One dose is  recommended after age 32.  Pneumococcal polysaccharide (PPSV23) vaccine. One dose is recommended after age 51. Talk to your health care provider about which screenings and vaccines you need and how often you need them. This information is not intended to replace advice given to you by your health care provider. Make sure you discuss any questions you have with your health care provider. Document Released: 12/13/2015 Document Revised: 08/05/2016 Document Reviewed: 09/17/2015 Elsevier Interactive Patient Education  2017 [place] Prevention in the Home Falls can cause injuries. They can happen to people of all ages. There are many things you can do to make your home safe and to help prevent falls. What can I do on the outside of my home?  Regularly fix the edges of walkways and driveways and fix any cracks.  Remove anything that might make you trip as you walk through a door, such as a raised step or threshold.  Trim any bushes or trees on the path to your home.  Use bright outdoor lighting.  Clear any walking paths of anything that might make someone trip, such as rocks or tools.  Regularly check to see if handrails are loose or broken. Make sure that both sides of any steps have handrails.  Any raised decks and porches should have guardrails on the edges.  Have any leaves, snow, or ice cleared regularly.  Use sand or salt on walking paths during winter.  Clean up any spills in your garage right away. This includes oil or grease spills. What can I do in the bathroom?  Use night lights.  Install grab bars by the toilet and in the tub and shower. Do not use towel bars as grab bars.  Use non-skid mats or decals in the tub or shower.  If you need to sit down in the shower, use a plastic, non-slip stool.  Keep the floor dry. Clean up any water that spills on the floor as soon as it happens.  Remove soap buildup in the tub or shower regularly.  Attach bath mats  securely with double-sided non-slip rug tape.  Do not have throw rugs and other things on the floor that can make you trip. What can I do in the bedroom?  Use night lights.  Make sure that you have a light by your bed that is easy to reach.  Do not use any sheets or blankets that are too big for your bed. They should not hang down onto the floor.  Have a firm chair that has side arms. You can use this for support while you get dressed.  Do not have throw rugs and other things on the floor that can make you trip. What can I do in the kitchen?  Clean up any spills right away.  Avoid walking on wet floors.  Keep items that you use a lot in easy-to-reach places.  If you need to reach something above you, use a strong step stool that has a grab bar.  Keep electrical cords out of the way.  Do not use floor polish or wax that makes floors slippery. If you must use wax, use non-skid floor wax.  Do not have throw rugs and other things on the floor that  can make you trip. What can I do with my stairs?  Do not leave any items on the stairs.  Make sure that there are handrails on both sides of the stairs and use them. Fix handrails that are broken or loose. Make sure that handrails are as long as the stairways.  Check any carpeting to make sure that it is firmly attached to the stairs. Fix any carpet that is loose or worn.  Avoid having throw rugs at the top or bottom of the stairs. If you do have throw rugs, attach them to the floor with carpet tape.  Make sure that you have a light switch at the top of the stairs and the bottom of the stairs. If you do not have them, ask someone to add them for you. What else can I do to help prevent falls?  Wear shoes that:  Do not have high heels.  Have rubber bottoms.  Are comfortable and fit you well.  Are closed at the toe. Do not wear sandals.  If you use a stepladder:  Make sure that it is fully opened. Do not climb a closed  stepladder.  Make sure that both sides of the stepladder are locked into place.  Ask someone to hold it for you, if possible.  Clearly mark and make sure that you can see:  Any grab bars or handrails.  First and last steps.  Where the edge of each step is.  Use tools that help you move around (mobility aids) if they are needed. These include:  Canes.  Walkers.  Scooters.  Crutches.  Turn on the lights when you go into a dark area. Replace any light bulbs as soon as they burn out.  Set up your furniture so you have a clear path. Avoid moving your furniture around.  If any of your floors are uneven, fix them.  If there are any pets around you, be aware of where they are.  Review your medicines with your doctor. Some medicines can make you feel dizzy. This can increase your chance of falling. Ask your doctor what other things that you can do to help prevent falls. This information is not intended to replace advice given to you by your health care provider. Make sure you discuss any questions you have with your health care provider. Document Released: 09/12/2009 Document Revised: 04/23/2016 Document Reviewed: 12/21/2014 Elsevier Interactive Patient Education  2017 [employer].

## 2020-01-08 NOTE — Progress Notes (Signed)
Subjective:   Thomas [name] is a 74 y.o. male who presents for an Initial Medicare Annual Wellness Visit.  This visit is being conducted via phone call  - after an attmept to do on video chat - due to the COVID-19 pandemic. This patient has given me verbal consent via phone to conduct this visit, patient states they are participating from their home address. Some vital signs may be absent or patient reported.   Patient identification: identified by name, DOB, and current address.    Review of Systems      Cardiac Risk Factors include: advanced age (>33men, >74 women);male gender;dyslipidemia;hypertension     Objective:    There were no vitals filed for this visit. There is no height or weight on file to calculate BMI.  Advanced Directives 01/08/2020  Does Patient Have a Medical Advance Directive? No  Would patient like information on creating a medical advance directive? Yes (MAU/Ambulatory/Procedural Areas - Information given)    Current Medications (verified) Outpatient Encounter Medications as of 01/08/2020  Medication Sig  . amLODipine (NORVASC) 2.5 MG tablet Take 1 tablet (2.5 mg total) by mouth daily.  Marland Kitchen atorvastatin (LIPITOR) 10 MG tablet Take 1 tablet (10 mg total) by mouth daily.  Marland Kitchen UNABLE TO FIND Take by mouth daily. Allergy tablets  . warfarin (COUMADIN) 5 MG tablet Take 1 tablet (5 mg total) by mouth daily. (Patient taking differently: Take 5 mg by mouth daily. 5mg  tues wed thurs sat sun 2.5 mon and fri)  . Pseudoeph-Doxylamine-DM-APAP (DAYQUIL/NYQUIL COLD/FLU RELIEF PO) Take by mouth as needed.   No facility-administered encounter medications on file as of 01/08/2020.    Allergies (verified) Dexamethasone   History: Past Medical History:  Diagnosis Date  . Clotting disorder ([place])   . Hyperlipidemia   . Hypertension   . Sinus infection   . Tinnitus    Past Surgical History:  Procedure Laterality Date  . CATARACT EXTRACTION Bilateral 1998, 2007  . MOHS  SURGERY Left 09/08/2018   Mohs surgery Ear Dr.  Leory Plowman [facility], Dr. Lynnell Jude  . TONSILLECTOMY  1956   Family History  Problem Relation Age of Onset  . Alzheimer's disease Mother   . Heart attack Mother   . Heart attack Father   . Cancer Sister   . Diabetes Sister   . Diabetes Sister    Social History   Socioeconomic History  . Marital status: Single    Spouse name: Not on file  . Number of children: 0  . Years of education: Not on file  . Highest education level: Not on file  Occupational History  . Occupation: Retried  Tobacco Use  . Smoking status: Former Smoker    Packs/day: 1.00    Quit date: 2002    Years since quitting: 19.1  . Smokeless tobacco: Never Used  . Tobacco comment: started smokng at age 20  Substance and Sexual Activity  . Alcohol use: Not Currently    Alcohol/week: 0.0 standard drinks    Comment: rare  . Drug use: No  . Sexual activity: Not on file  Other Topics Concern  . Not on file  Social History Narrative  . Not on file   Social Determinants of Health   Financial Resource Strain:   . Difficulty of Paying Living Expenses: Not on file  Food Insecurity:   . Worried About Charity fundraiser in the Last Year: Not on file  . Ran Out of Food in the Last Year:  Not on file  Transportation Needs:   . Lack of Transportation (Medical): Not on file  . Lack of Transportation (Non-Medical): Not on file  Physical Activity:   . Days of Exercise per Week: Not on file  . Minutes of Exercise per Session: Not on file  Stress:   . Feeling of Stress : Not on file  Social Connections:   . Frequency of Communication with Friends and Family: Not on file  . Frequency of Social Gatherings with Friends and Family: Not on file  . Attends Religious Services: Not on file  . Active Member of Clubs or Organizations: Not on file  . Attends Archivist Meetings: Not on file  . Marital Status: Not on file    Tobacco Counseling Counseling given: Not  Answered Comment: started smokng at age 60   Clinical Intake:  Pre-visit preparation completed: Yes  Pain : No/denies pain     Nutritional Risks: None Diabetes: No  How often do you need to have someone help you when you read instructions, pamphlets, or other written materials from your doctor or pharmacy?: 1 - Never  Interpreter Needed?: No  Information entered by :: Anicka Stuckert,LPN   Activities of Daily Living In your present state of health, do you have any difficulty performing the following activities: 01/08/2020 11/08/2019  Hearing? 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 N N  Comment glasses, goes to [facility] -  Difficulty concentrating or making decisions? N N  Walking or climbing stairs? N N  Dressing or bathing? N N  Doing errands, shopping? N N  Preparing Food and eating ? N -  Using the Toilet? N -  In the past six months, have you accidently leaked urine? N -  Do you have problems with loss of bowel control? N -  Managing your Medications? N -  Managing your Finances? N -  Housekeeping or managing your Housekeeping? N -  Some recent data might be hidden     Immunizations and Health Maintenance Immunization History  Administered Date(s) Administered  . Fluad Quad(high Dose 65+) 11/08/2019  . Pneumococcal Conjugate-13 01/07/2016  . Pneumococcal Polysaccharide-23 12/07/2011   Health Maintenance Due  Topic Date Due  . Hepatitis C Screening  [birth year]  . COLONOSCOPY  05/23/1996    Patient Care Team: Volney American, PA-C as PCP - General (Family Medicine) Marcial Pacas, MD as Referring Physician (Plastic Surgery) Merril Abbe, MD as Referring Physician (Dermatology)  Indicate any recent Medical Services you may have received from other than [facility] providers in the past year (date may be approximate).     Assessment:   This is a routine wellness examination for Thomas [name].  Hearing/Vision  screen  Hearing Screening   125Hz  250Hz  500Hz  1000Hz  2000Hz  3000Hz  4000Hz  6000Hz  8000Hz   Right ear:           Left ear:           Vision Screening Comments: [facility]   Dietary issues and exercise activities discussed: Current Exercise Habits: The patient does not participate in regular exercise at present, Exercise limited by: None identified  Goals   None    Depression Screen PHQ 2/9 Scores 01/08/2020 11/08/2019 02/15/2017  PHQ - 2 Score 0 0 0  PHQ- 9 Score - 0 0    Fall Risk Fall Risk  01/08/2020 11/08/2019 04/15/2018 02/15/2017  Falls in the past year? 0 0 No No  Number falls in past yr: 0 0 - -  Injury with Fall? 0 0 - -   FALL RISK PREVENTION PERTAINING TO THE HOME:  Any stairs in or around the home? yes If so, are there any without handrails? No   Home free of loose throw rugs in walkways, pet beds, electrical cords, etc? Yes  Adequate lighting in your home to reduce risk of falls? Yes   ASSISTIVE DEVICES UTILIZED TO PREVENT FALLS:  Life alert? No  Use of a cane, walker or w/c? No  Grab bars in the bathroom? No  Shower chair or bench in shower? No  Elevated toilet seat or a handicapped toilet? No    DME ORDERS:  DME order needed?  No   TIMED UP AND GO:  Unable to perform    Cognitive Function:        Screening Tests Health Maintenance  Topic Date Due  . Hepatitis C Screening  August 13, 1946  . COLONOSCOPY  05/23/1996  . TETANUS/TDAP  01/07/2021 (Originally 05/23/1965)  . INFLUENZA VACCINE  Completed  . PNA vac Low Risk Adult  Completed    Qualifies for Shingles Vaccine? Yes  Zostavax completed n/a. Due for Shingrix. Education has been provided regarding the importance of this vaccine. Pt has been advised to call insurance company to determine out of pocket expense. Advised may also receive vaccine at local pharmacy or Health Dept. Verbalized acceptance and understanding.  Tdap: Discussed need for TD/TDAP vaccine, patient verbalized understanding that this is  not covered as a preventative with there insurance and to call the office if he gets develops any new skin injuries, ie: cuts, scrapes, bug bites, or open wounds..  Flu Vaccine: up to date  Pneumococcal Vaccine: up to date .   Cancer Screenings:  Colorectal Screening: declined  Lung Cancer Screening: (Low Dose CT Chest recommended if Age 2-80 years, 30 pack-year currently smoking OR have quit w/in 15years.) does not qualify.     Additional Screening:  Hepatitis C Screening: does qualify; due now   Vision Screening: Recommended annual ophthalmology exams for early detection of glaucoma and other disorders of the eye. Is the patient up to date with their annual eye exam?  Yes  Who is the provider or what is the name of the office in which the pt attends annual eye exams?  [facility]    Dental Screening: Recommended annual dental exams for proper oral hygiene  Community Resource Referral:  CRR required this visit?  No       Plan:  I have personally reviewed and addressed the Medicare Annual Wellness questionnaire and have noted the following in the patient's chart:  A. Medical and social history B. Use of alcohol, tobacco or illicit drugs  C. Current medications and supplements D. Functional ability and status E.  Nutritional status F.  Physical activity G. Advance directives H. List of other physicians I.  Hospitalizations, surgeries, and ER visits in previous 12 months J.  [place] such as hearing and vision if needed, cognitive and depression L. Referrals and appointments   In addition, I have reviewed and discussed with patient certain preventive protocols, quality metrics, and best practice recommendations. A written personalized care plan for preventive services as well as general preventive health recommendations were provided to patient.   Signed,    Bevelyn Ngo, LPN   [id number]  Nurse Health Advisor   Nurse Notes: none

## 2020-01-10 NOTE — Assessment & Plan Note (Signed)
INR at goal today at 2.3, continue current regimen and recheck in 1 month

## 2020-02-01 ENCOUNTER — Other Ambulatory Visit: Payer: Self-pay

## 2020-02-01 ENCOUNTER — Ambulatory Visit (INDEPENDENT_AMBULATORY_CARE_PROVIDER_SITE_OTHER): Payer: Medicare Other | Admitting: Family Medicine

## 2020-02-01 ENCOUNTER — Encounter: Payer: Self-pay | Admitting: Family Medicine

## 2020-02-01 VITALS — BP 122/73 | HR 73 | Temp 98.6°F | Ht 71.5 in | Wt 170.0 lb

## 2020-02-01 DIAGNOSIS — J3089 Other allergic rhinitis: Secondary | ICD-10-CM | POA: Diagnosis not present

## 2020-02-01 DIAGNOSIS — Z86718 Personal history of other venous thrombosis and embolism: Secondary | ICD-10-CM | POA: Diagnosis not present

## 2020-02-01 LAB — COAGUCHEK XS/INR WAIVED
INR: 2.7 — ABNORMAL HIGH (ref 0.9–1.1)
Prothrombin Time: 32.7 s

## 2020-02-01 MED ORDER — MONTELUKAST SODIUM 10 MG PO TABS: 10.0000 mg | ORAL_TABLET | Freq: Every day | ORAL | 1 refills | Status: DC

## 2020-02-01 NOTE — Progress Notes (Signed)
BP 122/73   Pulse 73   Temp 98.6 F (37 C) (Oral)   Ht 5' 11.5" (1.816 m)   Wt 170 lb (77.1 kg)   SpO2 95%   BMI 23.38 kg/m    Subjective:    Patient ID: Thomas [name], male    DOB: 1946/10/01, 74 y.o.   MRN: [id number]  HPI: ARJAY HIBBERD is a 74 y.o. male  Chief Complaint  Patient presents with  . Coagulation Disorder   Pt presenting today for 1 month INR f/u for hx of DVT. Currently taking 2.5 mg M and F, 5 mg all other days. Denies bleeding or bruising issues, palpitations, CP, SOB, med or diet changes.   Rhinorrhea and sinus pressure ongoing the past 3+ weeks, states hx of allergies since teenage years. Currently taking equate allergy relief tabs  Relevant past medical, surgical, family and social history reviewed and updated as indicated. Interim medical history since our last visit reviewed. Allergies and medications reviewed and updated.  Review of Systems  Per HPI unless specifically indicated above     Objective:    BP 122/73   Pulse 73   Temp 98.6 F (37 C) (Oral)   Ht 5' 11.5" (1.816 m)   Wt 170 lb (77.1 kg)   SpO2 95%   BMI 23.38 kg/m   Wt Readings from Last 3 Encounters:  02/01/20 170 lb (77.1 kg)  01/05/20 171 lb (77.6 kg)  12/06/19 173 lb (78.5 kg)    Physical Exam Vitals and nursing note reviewed.  Constitutional:      Appearance: Normal appearance.  HENT:     Head: Atraumatic.     Right Ear: External ear normal.     Left Ear: External ear normal.     Nose: Rhinorrhea present.     Mouth/Throat:     Mouth: Mucous membranes are moist.     Pharynx: Posterior oropharyngeal erythema present.  Eyes:     Extraocular Movements: Extraocular movements intact.     Conjunctiva/sclera: Conjunctivae normal.  Cardiovascular:     Rate and Rhythm: Normal rate and regular rhythm.  Pulmonary:     Effort: Pulmonary effort is normal.     Breath sounds: Normal breath sounds.  Musculoskeletal:        General: Normal range of motion.   Cervical back: Normal range of motion and neck supple.  Skin:    General: Skin is warm and dry.  Neurological:     General: No focal deficit present.     Mental Status: He is oriented to person, place, and time.  Psychiatric:        Mood and Affect: Mood normal.        Thought Content: Thought content normal.        Judgment: Judgment normal.     Results for orders placed or performed in visit on 02/01/20  CoaguChek XS/INR Waived  Result Value Ref Range   INR 2.7 (H) 0.9 - 1.1   Prothrombin Time 32.7 sec      Assessment & Plan:   Problem List Items Addressed This Visit      Respiratory   Allergic rhinitis    Add singulair, flonase to antihistamine regimen. F/u if not resolving ongoing sxs        Other   H/O deep venous thrombosis - Primary    INR stable today at 2.7. Continue current regimen      Relevant Orders   CoaguChek XS/INR Waived (Completed)  Follow up plan: Return in about 4 weeks (around 02/29/2020) for INR.

## 2020-02-07 NOTE — Assessment & Plan Note (Signed)
INR stable today at 2.7. Continue current regimen

## 2020-02-07 NOTE — Assessment & Plan Note (Signed)
Add singulair, flonase to antihistamine regimen. F/u if not resolving ongoing sxs

## 2020-02-29 ENCOUNTER — Ambulatory Visit (INDEPENDENT_AMBULATORY_CARE_PROVIDER_SITE_OTHER): Payer: Medicare Other | Admitting: Family Medicine

## 2020-02-29 ENCOUNTER — Other Ambulatory Visit: Payer: Self-pay

## 2020-02-29 ENCOUNTER — Encounter: Payer: Self-pay | Admitting: Family Medicine

## 2020-02-29 VITALS — BP 137/77 | HR 60 | Temp 98.2°F | Wt 174.0 lb

## 2020-02-29 DIAGNOSIS — Z86718 Personal history of other venous thrombosis and embolism: Secondary | ICD-10-CM

## 2020-02-29 LAB — COAGUCHEK XS/INR WAIVED
INR: 2.1 — ABNORMAL HIGH (ref 0.9–1.1)
Prothrombin Time: 25.1 s

## 2020-02-29 NOTE — Progress Notes (Signed)
   BP 137/77   Pulse 60   Temp 98.2 F (36.8 C) (Oral)   Wt 174 lb (78.9 kg)   SpO2 96%   BMI 23.93 kg/m    Subjective:    Patient ID: Thomas [name], male    DOB: Jun 04, 1946, 74 y.o.   MRN: [id number]  HPI: Thomas [name] is a 74 y.o. male  Chief Complaint  Patient presents with  . 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 Denies any new concerns, side effects to his coumadin, bleeding or bruising issues, medication changes. INR has been stable for quite some time on current dose of 2.5 mg M and F and 5 mg all other days.   Relevant past medical, surgical, family and social history reviewed and updated as indicated. Interim medical history since our last visit reviewed. Allergies and medications reviewed and updated.  Review of Systems  Per HPI unless specifically indicated above     Objective:    BP 137/77   Pulse 60   Temp 98.2 F (36.8 C) (Oral)   Wt 174 lb (78.9 kg)   SpO2 96%   BMI 23.93 kg/m   Wt Readings from Last 3 Encounters:  02/29/20 174 lb (78.9 kg)  02/01/20 170 lb (77.1 kg)  01/05/20 171 lb (77.6 kg)    Physical Exam Vitals and nursing note reviewed.  Constitutional:      Appearance: Normal appearance.  HENT:     Head: Atraumatic.  Eyes:     Extraocular Movements: Extraocular movements intact.     Conjunctiva/sclera: Conjunctivae normal.  Cardiovascular:     Rate and Rhythm: Normal rate and regular rhythm.  Pulmonary:     Effort: Pulmonary effort is normal.     Breath sounds: Normal breath sounds.  Musculoskeletal:        General: Normal range of motion.     Cervical back: Normal range of motion and neck supple.  Skin:    General: Skin is warm and dry.  Neurological:     General: No focal deficit present.     Mental Status: He is oriented to person, place, and time.  Psychiatric:        Mood and Affect: Mood normal.        Thought Content: Thought content normal.        Judgment: Judgment normal.     Results for orders placed or performed in visit on 02/29/20  CoaguChek XS/INR Waived  Result Value Ref Range   INR 2.1 (H) 0.9 - 1.1   Prothrombin Time 25.1 sec      Assessment & Plan:   Problem List Items Addressed This Visit      Other   H/O deep venous thrombosis - Primary    INR stable at 2.1 today, continue current regimen      Relevant Orders   CoaguChek XS/INR Waived (Completed)       Follow up plan: Return in about 4 weeks (around 03/28/2020) for INR.

## 2020-03-02 NOTE — Assessment & Plan Note (Signed)
INR stable at 2.1 today, continue current regimen

## 2020-04-03 ENCOUNTER — Ambulatory Visit (INDEPENDENT_AMBULATORY_CARE_PROVIDER_SITE_OTHER): Payer: Medicare Other | Admitting: Family Medicine

## 2020-04-03 ENCOUNTER — Other Ambulatory Visit: Payer: Self-pay

## 2020-04-03 ENCOUNTER — Encounter: Payer: Self-pay | Admitting: Family Medicine

## 2020-04-03 VITALS — BP 148/74 | HR 73 | Temp 98.6°F | Wt 175.0 lb

## 2020-04-03 DIAGNOSIS — Z86718 Personal history of other venous thrombosis and embolism: Secondary | ICD-10-CM | POA: Diagnosis not present

## 2020-04-03 DIAGNOSIS — R7301 Impaired fasting glucose: Secondary | ICD-10-CM | POA: Insufficient documentation

## 2020-04-03 LAB — COAGUCHEK XS/INR WAIVED
INR: 2.4 — ABNORMAL HIGH (ref 0.9–1.1)
Prothrombin Time: 28.7 s

## 2020-04-03 NOTE — Progress Notes (Signed)
   BP (!) 148/74 (BP Location: Left Arm, Patient Position: Sitting, Cuff Size: Normal)   Pulse 73   Temp 98.6 F (37 C) (Oral)   Wt 175 lb (79.4 kg)   SpO2 96%   BMI 24.07 kg/m    Subjective:    Patient ID: Thomas [name], male    DOB: 11/23/1946, 74 y.o.   MRN: [id number]  HPI: Thomas [name] is a 74 y.o. male  Chief Complaint  Patient presents with  . Coagulation Disorder   Patient presenting today for 1 month INR f/u for chronic DVT, hx of PE and antithrombin 3 def. Expected anticoag course lifelong. No new concerns or issues with coumadin, including bleeding or bruising issues. Diet stable, no med changes. Current coumadin dose is 2.5 mg M and F, 5 mg all other days.   Relevant past medical, surgical, family and social history reviewed and updated as indicated. Interim medical history since our last visit reviewed. Allergies and medications reviewed and updated.  Review of Systems  Per HPI unless specifically indicated above     Objective:    BP (!) 148/74 (BP Location: Left Arm, Patient Position: Sitting, Cuff Size: Normal)   Pulse 73   Temp 98.6 F (37 C) (Oral)   Wt 175 lb (79.4 kg)   SpO2 96%   BMI 24.07 kg/m   Wt Readings from Last 3 Encounters:  04/03/20 175 lb (79.4 kg)  02/29/20 174 lb (78.9 kg)  02/01/20 170 lb (77.1 kg)    Physical Exam Vitals and nursing note reviewed.  Constitutional:      Appearance: Normal appearance.  HENT:     Head: Atraumatic.  Eyes:     Extraocular Movements: Extraocular movements intact.     Conjunctiva/sclera: Conjunctivae normal.  Cardiovascular:     Rate and Rhythm: Normal rate and regular rhythm.  Pulmonary:     Effort: Pulmonary effort is normal.     Breath sounds: Normal breath sounds.  Musculoskeletal:        General: Normal range of motion.     Cervical back: Normal range of motion and neck supple.  Skin:    General: Skin is warm and dry.  Neurological:     General: No focal deficit present.   Mental Status: He is oriented to person, place, and time.  Psychiatric:        Mood and Affect: Mood normal.        Thought Content: Thought content normal.        Judgment: Judgment normal.     Results for orders placed or performed in visit on 04/03/20  CoaguChek XS/INR Waived  Result Value Ref Range   INR 2.4 (H) 0.9 - 1.1   Prothrombin Time 28.7 sec      Assessment & Plan:   Problem List Items Addressed This Visit      Other   H/O deep venous thrombosis - Primary    INR stable and at goal at 2.4 today. Continue current regimen      Relevant Orders   CoaguChek XS/INR Waived (Completed)       Follow up plan: Return in about 4 weeks (around 05/01/2020) for INR.

## 2020-04-07 NOTE — Assessment & Plan Note (Signed)
INR stable and at goal at 2.4 today. Continue current regimen

## 2020-04-22 ENCOUNTER — Other Ambulatory Visit: Payer: Self-pay | Admitting: Family Medicine

## 2020-05-06 ENCOUNTER — Ambulatory Visit (INDEPENDENT_AMBULATORY_CARE_PROVIDER_SITE_OTHER): Payer: Medicare Other | Admitting: Family Medicine

## 2020-05-06 ENCOUNTER — Encounter: Payer: Self-pay | Admitting: Family Medicine

## 2020-05-06 ENCOUNTER — Other Ambulatory Visit: Payer: Self-pay

## 2020-05-06 VITALS — BP 123/78 | HR 71 | Temp 98.1°F | Wt 172.0 lb

## 2020-05-06 DIAGNOSIS — E785 Hyperlipidemia, unspecified: Secondary | ICD-10-CM | POA: Diagnosis not present

## 2020-05-06 DIAGNOSIS — Z86718 Personal history of other venous thrombosis and embolism: Secondary | ICD-10-CM | POA: Diagnosis not present

## 2020-05-06 DIAGNOSIS — R7301 Impaired fasting glucose: Secondary | ICD-10-CM

## 2020-05-06 DIAGNOSIS — I10 Essential (primary) hypertension: Secondary | ICD-10-CM

## 2020-05-06 DIAGNOSIS — J3089 Other allergic rhinitis: Secondary | ICD-10-CM

## 2020-05-06 LAB — COAGUCHEK XS/INR WAIVED
INR: 2.9 — ABNORMAL HIGH (ref 0.9–1.1)
Prothrombin Time: 34.4 s

## 2020-05-06 MED ORDER — AMLODIPINE BESYLATE 2.5 MG PO TABS: 2.5000 mg | ORAL_TABLET | Freq: Every day | ORAL | 1 refills | Status: DC

## 2020-05-06 NOTE — Assessment & Plan Note (Signed)
Recheck A1C, adjust as needed. Continue current regimen. 

## 2020-05-06 NOTE — Assessment & Plan Note (Signed)
INR stable at 2.9, continue current regimen

## 2020-05-06 NOTE — Assessment & Plan Note (Signed)
Recheck lipids, adjust as needed. Continue current regimen 

## 2020-05-06 NOTE — Assessment & Plan Note (Signed)
Continue current regimen, add nasal sprays prn for improved relief of sxs

## 2020-05-06 NOTE — Assessment & Plan Note (Signed)
BPs stable and WNL, continue current regimen 

## 2020-05-06 NOTE — Progress Notes (Signed)
BP 123/78   Pulse 71   Temp 98.1 F (36.7 C) (Oral)   Wt 172 lb (78 kg)   SpO2 97%   BMI 23.65 kg/m    Subjective:    Patient ID: Thomas [name], male    DOB: 1946-03-23, 74 y.o.   MRN: [id number]  HPI: Thomas [name] is a 74 y.o. male  Chief Complaint  Patient presents with  . Coagulation Disorder   Presenting today for 1 month INR and also 6 month f/u chronic conditions.   On coumadin with expected duration being lifelong for chronic DVT, hx of PE and antithrombin 3 deficiency. Current dose is 2.5 mg M and F and 5 mg all other days. Tolerating well, no bleeding or bruising issues. No new meds or diet changes.   HTN - Not checking home BPs consistently but 120/80s when checked. Denies CP, SOB, HAs, dizziness.   HLD - On lipitor, tolerating well without myalgias, claudication. Trying to eat well and stay active.   IFG - diet controlled. No polyuria, polydipsia, polyphagia.   Allergies - still giving him issues occasionally with sinus drainage, pressure. On antihistamines and singulair without significant relief.    Relevant past medical, surgical, family and social history reviewed and updated as indicated. Interim medical history since our last visit reviewed. Allergies and medications reviewed and updated.  Review of Systems  Per HPI unless specifically indicated above     Objective:    BP 123/78   Pulse 71   Temp 98.1 F (36.7 C) (Oral)   Wt 172 lb (78 kg)   SpO2 97%   BMI 23.65 kg/m   Wt Readings from Last 3 Encounters:  05/06/20 172 lb (78 kg)  04/03/20 175 lb (79.4 kg)  02/29/20 174 lb (78.9 kg)    Physical Exam Vitals and nursing note reviewed.  Constitutional:      Appearance: Normal appearance.  HENT:     Head: Atraumatic.  Eyes:     Extraocular Movements: Extraocular movements intact.     Conjunctiva/sclera: Conjunctivae normal.  Cardiovascular:     Rate and Rhythm: Normal rate and regular rhythm.  Pulmonary:     Effort: Pulmonary  effort is normal.     Breath sounds: Normal breath sounds.  Musculoskeletal:        General: Normal range of motion.     Cervical back: Normal range of motion and neck supple.  Skin:    General: Skin is warm and dry.  Neurological:     General: No focal deficit present.     Mental Status: He is oriented to person, place, and time.  Psychiatric:        Mood and Affect: Mood normal.        Thought Content: Thought content normal.        Judgment: Judgment normal.     Results for orders placed or performed in visit on 04/03/20  CoaguChek XS/INR Waived  Result Value Ref Range   INR 2.4 (H) 0.9 - 1.1   Prothrombin Time 28.7 sec      Assessment & Plan:   Problem List Items Addressed This Visit      Cardiovascular and Mediastinum   Essential hypertension    BPs stable and WNL, continue current regimen      Relevant Medications   amLODipine (NORVASC) 2.5 MG tablet   Other Relevant Orders   Comprehensive metabolic panel     Respiratory   Allergic rhinitis    Continue  current regimen, add nasal sprays prn for improved relief of sxs        Endocrine   Impaired fasting glucose    Recheck A1C, adjust as needed. Continue current regimen      Relevant Orders   HgB A1c     Other   H/O deep venous thrombosis - Primary    INR stable at 2.9, continue current regimen      Relevant Orders   CoaguChek XS/INR Waived   Hyperlipidemia    Recheck lipids, adjust as needed. Continue current regimen      Relevant Medications   amLODipine (NORVASC) 2.5 MG tablet   Other Relevant Orders   Lipid Panel w/o Chol/HDL Ratio       Follow up plan: Return in about 4 weeks (around 06/03/2020) for INR.

## 2020-05-07 ENCOUNTER — Encounter: Payer: Self-pay | Admitting: Family Medicine

## 2020-05-07 LAB — COMPREHENSIVE METABOLIC PANEL
ALT: 6 IU/L (ref 0–44)
AST: 14 IU/L (ref 0–40)
Albumin/Globulin Ratio: 1.7 (ref 1.2–2.2)
Albumin: 4.5 g/dL (ref 3.7–4.7)
Alkaline Phosphatase: 61 IU/L (ref 48–121)
BUN/Creatinine Ratio: 15 (ref 10–24)
BUN: 15 mg/dL (ref 8–27)
Bilirubin Total: 0.6 mg/dL (ref 0.0–1.2)
CO2: 23 mmol/L (ref 20–29)
Calcium: 9 mg/dL (ref 8.6–10.2)
Chloride: 102 mmol/L (ref 96–106)
Creatinine, Ser: 0.99 mg/dL (ref 0.76–1.27)
GFR calc Af Amer: 87 mL/min/{1.73_m2} (ref >59)
GFR calc non Af Amer: 75 mL/min/{1.73_m2} (ref >59)
Globulin, Total: 2.7 g/dL (ref 1.5–4.5)
Glucose: 90 mg/dL (ref 65–99)
Potassium: 4.3 mmol/L (ref 3.5–5.2)
Sodium: 139 mmol/L (ref 134–144)
Total Protein: 7.2 g/dL (ref 6.0–8.5)

## 2020-05-07 LAB — LIPID PANEL W/O CHOL/HDL RATIO
Cholesterol, Total: 119 mg/dL (ref 100–199)
HDL: 45 mg/dL (ref >39)
LDL Chol Calc (NIH): 62 mg/dL (ref 0–99)
Triglycerides: 50 mg/dL (ref 0–149)
VLDL Cholesterol Cal: 12 mg/dL (ref 5–40)

## 2020-05-07 LAB — HEMOGLOBIN A1C
Est. average glucose Bld gHb Est-mCnc: 117 mg/dL
Hgb A1c MFr Bld: 5.7 % — ABNORMAL HIGH (ref 4.8–5.6)

## 2020-06-05 ENCOUNTER — Ambulatory Visit (INDEPENDENT_AMBULATORY_CARE_PROVIDER_SITE_OTHER): Payer: Medicare Other | Admitting: Family Medicine

## 2020-06-05 ENCOUNTER — Encounter: Payer: Self-pay | Admitting: Family Medicine

## 2020-06-05 ENCOUNTER — Other Ambulatory Visit: Payer: Self-pay

## 2020-06-05 VITALS — BP 138/77 | HR 67 | Temp 98.3°F | Wt 174.0 lb

## 2020-06-05 DIAGNOSIS — D6859 Other primary thrombophilia: Secondary | ICD-10-CM | POA: Diagnosis not present

## 2020-06-05 DIAGNOSIS — Z86718 Personal history of other venous thrombosis and embolism: Secondary | ICD-10-CM

## 2020-06-05 LAB — COAGUCHEK XS/INR WAIVED
INR: 2.4 — ABNORMAL HIGH (ref 0.9–1.1)
Prothrombin Time: 28.6 s

## 2020-06-05 NOTE — Assessment & Plan Note (Signed)
Stable and under good control, continue current coumadin regimen

## 2020-06-05 NOTE — Progress Notes (Signed)
BP 138/77   Pulse 67   Temp 98.3 F (36.8 C) (Oral)   Wt 174 lb (78.9 kg)   SpO2 96%   BMI 23.93 kg/m    Subjective:    Patient ID: Thomas [name], male    DOB: 1946-04-27, 74 y.o.   MRN: [id number]  HPI: Thomas [name] is a 74 y.o. male  Chief Complaint  Patient presents with  . 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 Current coumadin dose is 2.5 mg M and F and 5 mg all other days. Tolerating this well without bleeding or bruising issues, CP, SOB, palpitations, leg edema. No new medications or dietary modifications.   Relevant past medical, surgical, family and social history reviewed and updated as indicated. Interim medical history since our last visit reviewed. Allergies and medications reviewed and updated.  Review of Systems  Per HPI unless specifically indicated above     Objective:    BP 138/77   Pulse 67   Temp 98.3 F (36.8 C) (Oral)   Wt 174 lb (78.9 kg)   SpO2 96%   BMI 23.93 kg/m   Wt Readings from Last 3 Encounters:  06/05/20 174 lb (78.9 kg)  05/06/20 172 lb (78 kg)  04/03/20 175 lb (79.4 kg)    Physical Exam Vitals and nursing note reviewed.  Constitutional:      Appearance: Normal appearance.  HENT:     Head: Atraumatic.  Eyes:     Extraocular Movements: Extraocular movements intact.     Conjunctiva/sclera: Conjunctivae normal.  Cardiovascular:     Rate and Rhythm: Normal rate and regular rhythm.  Pulmonary:     Effort: Pulmonary effort is normal.     Breath sounds: Normal breath sounds.  Musculoskeletal:        General: Normal range of motion.     Cervical back: Normal range of motion and neck supple.  Skin:    General: Skin is warm and dry.  Neurological:     General: No focal deficit present.     Mental Status: He is oriented to person, place, and time.  Psychiatric:        Mood and Affect: Mood normal.        Thought Content: Thought content normal.          Judgment: Judgment normal.     Results for orders placed or performed in visit on 05/06/20  CoaguChek XS/INR Waived  Result Value Ref Range   INR 2.9 (H) 0.9 - 1.1   Prothrombin Time 34.4 sec  Comprehensive metabolic panel  Result Value Ref Range   Glucose 90 65 - 99 mg/dL   BUN 15 8 - 27 mg/dL   Creatinine, Ser 0.99 0.76 - 1.27 mg/dL   GFR calc non Af Amer 75 >59 mL/min/1.73   GFR calc Af Amer 87 >59 mL/min/1.73   BUN/Creatinine Ratio 15 10 - 24   Sodium 139 134 - 144 mmol/L   Potassium 4.3 3.5 - 5.2 mmol/L   Chloride 102 96 - 106 mmol/L   CO2 23 20 - 29 mmol/L   Calcium 9.0 8.6 - 10.2 mg/dL   Total Protein 7.2 6.0 - 8.5 g/dL   Albumin 4.5 3.7 - 4.7 g/dL   Globulin, Total 2.7 1.5 - 4.5 g/dL   Albumin/Globulin Ratio 1.7 1.2 - 2.2   Bilirubin Total 0.6 0.0 - 1.2 mg/dL   Alkaline Phosphatase 61  48 - 121 IU/L   AST 14 0 - 40 IU/L   ALT 6 0 - 44 IU/L  Lipid Panel w/o Chol/HDL Ratio  Result Value Ref Range   Cholesterol, Total 119 100 - 199 mg/dL   Triglycerides 50 0 - 149 mg/dL   HDL 45 >39 mg/dL   VLDL Cholesterol Cal 12 5 - 40 mg/dL   LDL Chol Calc (NIH) 62 0 - 99 mg/dL  HgB A1c  Result Value Ref Range   Hgb A1c MFr Bld 5.7 (H) 4.8 - 5.6 %   Est. average glucose Bld gHb Est-mCnc 117 mg/dL      Assessment & Plan:   Problem List Items Addressed This Visit      Hematopoietic and Hemostatic   Antithrombin 3 deficiency (HCC)    Stable and under good control, continue current coumadin regimen        Other   H/O deep venous thrombosis - Primary    INR WNL at 2.4 today, continue current regimen and f/u in 1 month for recheck      Relevant Orders   CoaguChek XS/INR Waived       Follow up plan: Return in about 4 weeks (around 07/03/2020) for INR.

## 2020-06-05 NOTE — Assessment & Plan Note (Signed)
INR WNL at 2.4 today, continue current regimen and f/u in 1 month for recheck

## 2020-06-16 ENCOUNTER — Other Ambulatory Visit: Payer: Self-pay | Admitting: Family Medicine

## 2020-06-16 DIAGNOSIS — Z86711 Personal history of pulmonary embolism: Secondary | ICD-10-CM

## 2020-06-16 NOTE — Telephone Encounter (Signed)
Requested medications are due for refill today?  Yes - This medication refill cannot be delegated.    Requested medications are on active medication list?  Yes  Last Refill:   11/08/2019  # 90 with one refill   Future visit scheduled?  Yes  Notes to Clinic:  This medication refill cannot be delegated.

## 2020-07-03 ENCOUNTER — Ambulatory Visit: Payer: Medicare Other | Admitting: Family Medicine

## 2020-07-05 ENCOUNTER — Other Ambulatory Visit: Payer: Self-pay

## 2020-07-05 ENCOUNTER — Ambulatory Visit (INDEPENDENT_AMBULATORY_CARE_PROVIDER_SITE_OTHER): Payer: Medicare Other | Admitting: Family Medicine

## 2020-07-05 ENCOUNTER — Encounter: Payer: Self-pay | Admitting: Family Medicine

## 2020-07-05 VITALS — BP 133/78 | HR 67 | Temp 98.1°F | Wt 170.0 lb

## 2020-07-05 DIAGNOSIS — Z86718 Personal history of other venous thrombosis and embolism: Secondary | ICD-10-CM

## 2020-07-05 LAB — COAGUCHEK XS/INR WAIVED
INR: 3 — ABNORMAL HIGH (ref 0.9–1.1)
Prothrombin Time: 36.3 s

## 2020-07-05 MED ORDER — ATORVASTATIN CALCIUM 10 MG PO TABS: 10.0000 mg | ORAL_TABLET | Freq: Every day | ORAL | 1 refills | Status: DC

## 2020-07-05 NOTE — Progress Notes (Signed)
   BP 133/78   Pulse 67   Temp 98.1 F (36.7 C) (Oral)   Wt 170 lb (77.1 kg)   SpO2 97%   BMI 23.38 kg/m    Subjective:    Patient ID: Thomas [name], male    DOB: 1946/07/13, 74 y.o.   MRN: [id number]  HPI: Thomas [name] is a 74 y.o. male  Chief Complaint  Patient presents with  . Coagulation Disorder   Here today for 1 month INR f/u for chronic DVT, hx of PE and antithrombin 3 deficiency. Currently taking 2.5 mg coumadin M and F and 5 mg all other days. Tolerating this regimen well, no bleeding or bruising issues, CP, SOB, palpitations, leg edema. No new medications, diet changes, or concerns otherwise.   Relevant past medical, surgical, family and social history reviewed and updated as indicated. Interim medical history since our last visit reviewed. Allergies and medications reviewed and updated.  Review of Systems  Per HPI unless specifically indicated above     Objective:    BP 133/78   Pulse 67   Temp 98.1 F (36.7 C) (Oral)   Wt 170 lb (77.1 kg)   SpO2 97%   BMI 23.38 kg/m   Wt Readings from Last 3 Encounters:  07/05/20 170 lb (77.1 kg)  06/05/20 174 lb (78.9 kg)  05/06/20 172 lb (78 kg)    Physical Exam Vitals and nursing note reviewed.  Constitutional:      Appearance: Normal appearance.  HENT:     Head: Atraumatic.  Eyes:     Extraocular Movements: Extraocular movements intact.     Conjunctiva/sclera: Conjunctivae normal.  Cardiovascular:     Rate and Rhythm: Normal rate and regular rhythm.  Pulmonary:     Effort: Pulmonary effort is normal.     Breath sounds: Normal breath sounds.  Musculoskeletal:        General: Normal range of motion.     Cervical back: Normal range of motion and neck supple.  Skin:    General: Skin is warm and dry.  Neurological:     General: No focal deficit present.     Mental Status: He is oriented to person, place, and time.  Psychiatric:        Mood and Affect: Mood normal.        Thought Content: Thought  content normal.        Judgment: Judgment normal.     Results for orders placed or performed in visit on 06/05/20  CoaguChek XS/INR Waived  Result Value Ref Range   INR 2.4 (H) 0.9 - 1.1   Prothrombin Time 28.6 sec      Assessment & Plan:   Problem List Items Addressed This Visit      Other   H/O deep venous thrombosis - Primary    INR stable today at 3.0, continue current coumadin regimen. F/u in 1 month for recheck      Relevant Orders   CoaguChek XS/INR Waived       Follow up plan: Return in about 4 weeks (around 08/02/2020) for INR.

## 2020-07-05 NOTE — Assessment & Plan Note (Signed)
INR stable today at 3.0, continue current coumadin regimen. F/u in 1 month for recheck

## 2020-07-29 ENCOUNTER — Other Ambulatory Visit: Payer: Self-pay | Admitting: Family Medicine

## 2020-07-29 DIAGNOSIS — Z86711 Personal history of pulmonary embolism: Secondary | ICD-10-CM

## 2020-07-29 NOTE — Telephone Encounter (Signed)
Previous Thomas [name] patient, seeing Janett Billow next week.

## 2020-07-29 NOTE — Telephone Encounter (Signed)
Requested medication (s) are due for refill today: Yes  Requested medication (s) are on the active medication list: Yes  Last refill:  1 month ago  Future visit scheduled: Yes  Notes to clinic:  Unable to refill per protocol, diagnosis code needed, failed lab      Requested Prescriptions  Pending Prescriptions Disp Refills   warfarin (COUMADIN) 5 MG tablet [Pharmacy Med Name: WARFARIN SODIUM 5 MG TABLET] 90 tablet 1    Sig: TAKE 1 TABLET BY [place]      Hematology:  Anticoagulants - warfarin Failed - 07/29/2020  1:25 PM      Failed - This refill cannot be delegated      Failed - If the patient is managed by [facility] - route to their Pool. If not, forward to the provider.      Failed - INR in normal range and within 30 days    INR  Date Value Ref Range Status  07/05/2020 3.0 (H) 0.9 - 1.1 Final          Passed - Valid encounter within last 3 months    Recent Outpatient Visits           3 weeks ago H/O deep venous thrombosis   [place], [place], [place]   1 month ago H/O deep venous thrombosis   [facility] [address] [place], [place]   2 months ago H/O deep venous thrombosis   [facility] [facility] [place], [place]   3 months ago H/O deep venous thrombosis   [facility] Ro[facility] [place], [place]   5 months ago H/O deep venous thrombosis   [facility], Lilia Argue, [place]       Future Appointments             In 1 week Eulogio Bear, NP [employer], [place]   In 6 months  [employer], [place]

## 2020-08-04 ENCOUNTER — Other Ambulatory Visit: Payer: Self-pay | Admitting: Family Medicine

## 2020-08-08 ENCOUNTER — Encounter: Payer: Self-pay | Admitting: Nurse Practitioner

## 2020-08-08 ENCOUNTER — Ambulatory Visit (INDEPENDENT_AMBULATORY_CARE_PROVIDER_SITE_OTHER): Payer: Medicare Other | Admitting: Nurse Practitioner

## 2020-08-08 ENCOUNTER — Other Ambulatory Visit: Payer: Self-pay

## 2020-08-08 VITALS — BP 130/64 | HR 71 | Temp 98.1°F | Wt 172.0 lb

## 2020-08-08 DIAGNOSIS — Z23 Encounter for immunization: Secondary | ICD-10-CM | POA: Diagnosis not present

## 2020-08-08 DIAGNOSIS — Z86718 Personal history of other venous thrombosis and embolism: Secondary | ICD-10-CM | POA: Diagnosis not present

## 2020-08-08 LAB — COAGUCHEK XS/INR WAIVED
INR: 3.1 — ABNORMAL HIGH (ref 0.9–1.1)
Prothrombin Time: 37.4 s

## 2020-08-08 NOTE — Progress Notes (Signed)
BP 130/64 (BP Location: Left Arm, Cuff Size: Small)   Pulse 71   Temp 98.1 F (36.7 C) (Oral)   Wt 172 lb (78 kg)   BMI 23.65 kg/m    Subjective:    Patient ID: Thomas [name], male    DOB: 10-16-1946, 74 y.o.   MRN: [id number]  HPI: Thomas [name] is a 74 y.o. male presenting for INR check.  Chief Complaint  Patient presents with  . Follow-up  . INR    Coumadin Management.  Present dosing: Currently taking 5 mg Tuesday, Wednesday, Thursday, Saturday, Sunday Taking 2.5 mg Monday, F The expected duration of coumadin treatment is lifelong  The reason for anticoagulation is chronic DVT/PE. - 2012 Goal: 2.0-3.0  Excessive bruising: no Nose bleeding: no Rectal bleeding: no Eating diet with consistent amounts of foods containing Vitamin K:yes Any recent antibiotic use? no   Allergies  Allergen Reactions  . Dexamethasone Other (See Comments)    Renal Insufficiency   Outpatient Encounter Medications as of 08/08/2020  Medication Sig  . amLODipine (NORVASC) 2.5 MG tablet Take 1 tablet (2.5 mg total) by mouth daily.  Marland Kitchen atorvastatin (LIPITOR) 10 MG tablet Take 1 tablet (10 mg total) by mouth daily.  . diphenhydrAMINE-PE-APAP (EQ SEVERE ALLERGY & SINUS) 25-5-325 MG TABS Take 1 tablet by mouth as needed.  . montelukast (SINGULAIR) 10 MG tablet TAKE 1 TABLET BY MOUTH EVERYDAY AT BEDTIME  . Pseudoeph-Doxylamine-DM-APAP (DAYQUIL/NYQUIL COLD/FLU RELIEF PO) Take by mouth as needed.  . warfarin (COUMADIN) 5 MG tablet TAKE 1 TABLET BY MOUTH EVERY DAY   No facility-administered encounter medications on file as of 08/08/2020.   Patient Active Problem List   Diagnosis Date Noted  . Impaired fasting glucose 04/03/2020  . History of basal cell carcinoma (BCC) excision 09/09/2018  . Antithrombin 3 deficiency ([place]) 05/03/2015  . Airway hyperreactivity 05/03/2015  . 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  . Brash 05/03/2015  . H/O deep venous thrombosis 05/03/2015  . Hyperlipidemia 05/03/2015   . Essential hypertension 05/03/2015  . Healed or old pulmonary embolism 05/03/2015  . Allergic rhinitis 03/21/2012   Past Medical History:  Diagnosis Date  . Clotting disorder ([place])   . Hyperlipidemia   . Hypertension   . Sinus infection   . Tinnitus    Relevant past medical, surgical, family and social history reviewed and updated as indicated. Interim medical history since our last visit reviewed.  Review of Systems  Constitutional: Negative.  Negative for activity change and appetite change.  Respiratory: Negative.   Cardiovascular: Negative.   Musculoskeletal: Negative.   Skin: Negative.   Hematological: Negative.  Does not bruise/bleed easily.  Psychiatric/Behavioral: Negative.     Per HPI unless specifically indicated above     Objective:    BP 130/64 (BP Location: Left Arm, Cuff Size: Small)   Pulse 71   Temp 98.1 F (36.7 C) (Oral)   Wt 172 lb (78 kg)   BMI 23.65 kg/m   Wt Readings from Last 3 Encounters:  08/08/20 172 lb (78 kg)  07/05/20 170 lb (77.1 kg)  06/05/20 174 lb (78.9 kg)    Physical Exam Vitals and nursing note reviewed.  Constitutional:      General: He is not in acute distress.    Appearance: Normal appearance. He is not toxic-appearing.  Cardiovascular:     Rate and Rhythm: Normal rate.  Pulmonary:     Effort: Pulmonary effort is normal. No respiratory distress.  Musculoskeletal:  General: Normal range of motion.  Skin:    General: Skin is warm and dry.     Coloration: Skin is not jaundiced or pale.  Neurological:     Mental Status: He is alert.  Psychiatric:        Mood and Affect: Mood normal.        Behavior: Behavior normal.        Thought Content: Thought content normal.        Judgment: Judgment normal.       Assessment & Plan:   Problem List Items Addressed This Visit      Other   H/O deep venous thrombosis - Primary    Chronic, ongoing.  Goal INR 2.0-3.0.  Today INR is 3.1.  Will decrease weekly warfarin.  To  take 2.5 mg Monday, Wednesday, and Friday.  Continue 5 mg Tuesday, Thursday, Saturday, and Sunday.  Follow up INR in 2 weeks.  With any bleeding/bruising in meantime, call or return to clinic.      Relevant Orders   CoaguChek XS/INR Waived (STAT)    Other Visit Diagnoses    Need for influenza vaccination       Relevant Orders   Flu Vaccine QUAD High Dose(Fluad) (Completed)       Follow up plan: Return in about 2 weeks (around 08/22/2020) for INR check.

## 2020-08-08 NOTE — Assessment & Plan Note (Signed)
Chronic, ongoing.  Goal INR 2.0-3.0.  Today INR is 3.1.  Will decrease weekly warfarin.  To take 2.5 mg Monday, Wednesday, and Friday.  Continue 5 mg Tuesday, Thursday, Saturday, and Sunday.  Follow up INR in 2 weeks.  With any bleeding/bruising in meantime, call or return to clinic.

## 2020-08-08 NOTE — Patient Instructions (Addendum)
Thomas [name],   Be sure to take your warfarin 5 mg on Tuesday, Thursday, Saturday, and Sunday.    Take 2.5 mg on Monday, Wednesday, and Friday.  See you in about 2 weeks, let us know if you have any issues in the meantime.  Take care, Thomas [name]   Bleeding Precautions When on Anticoagulant Therapy, Adult Anticoagulant therapy, also called blood thinner therapy, is medicine that helps to prevent and treat blood clots. The medicine works by stopping blood clots from forming or growing. Blood clots that form in your blood vessels can be dangerous. They can break loose and travel to the heart, lungs, or brain. This increases the risk of a heart attack, stroke, or blocked lung artery (pulmonary embolism). Anticoagulants also increase the risk of bleeding. Try to protect yourself from cuts and other injuries that can cause bleeding. It is important to take anticoagulants exactly as told by your health care provider. Why do I need to be on anticoagulant therapy? You may need this medicine if you are at risk of developing a blood clot. Conditions that increase your risk of a blood clot include:  Being born with heart disease or a heart malformation (congenital heart disease).  Developing heart disease.  Having had surgery, such as valve replacement.  Having had a serious accident or other type of severe injury (trauma).  Having certain types of cancer.  Having certain diseases that can increase blood clotting.  Having a high risk of stroke or heart attack.  Having atrial fibrillation (AF). What are the common anticoagulant medicines? There are several types of anticoagulant medicines. The most common types are:  Medicines that you take by mouth (oral medicines), such as: ? Warfarin. ? Novel oral anticoagulants (NOACs), such as:  Direct thrombin inhibitors (dabigatran).  Factor Xa inhibitors (apixaban, edoxaban, and rivaroxaban).  Injections, such as: ? Unfractionated heparin. ? Low  molecular weight heparin. These anticoagulants work in different ways to prevent blood clots. They also have different risks and side effects. What do I need to remember while on anticoagulant therapy? Taking anticoagulants  Take your medicine at the same time every day. If you forget to take your medicine, take it as soon as you remember. Do not double your dosage of medicine if you miss a whole day. Take your normal dose and call your health care provider.  Do not stop taking your medicine unless your health care provider approves. Stopping the medicine can increase your risk of developing a blood clot. Taking other medicines  Take over-the-counter and prescriptions medicines only as told by your health care provider.  Do not take over-the-counter NSAIDs, including aspirin and ibuprofen, while you are on anticoagulant therapy. These medicines increase your risk of dangerous bleeding.  Get approval from your health care provider before you start taking any new medicines, vitamins, or herbal products. Some of these could interfere with your therapy. General instructions  Keep all follow-up visits as told by your health care provider. This is important.  If you are pregnant or trying to get pregnant, talk with a health care provider about anticoagulants. Some of these medicines are not safe to take during pregnancy.  Tell all health care providers, including your dentist, that you are on anticoagulant therapy. It is especially important to tell providers before you have any surgery, medical procedures, or dental work done. What precautions should I take?   Be very careful when using knives, scissors, or other sharp objects.  Use an electric razor instead of a  blade.  Do not use toothpicks.  Use a soft-bristled toothbrush. Brush your teeth gently.  Always wear shoes outdoors and wear slippers indoors.  Be careful when cutting your fingernails and toenails.  Place bath mats in the  bathroom. If possible, install handrails as well.  Wear gloves while you do yard work.  Wear your seat belt.  Prevent falls by removing loose rugs and extension cords from areas where you walk. Use a cane or walker if you need it.  Avoid constipation by: ? Drinking enough fluid to keep your urine clear or pale yellow. ? Eating foods that are high in fiber, such as fresh fruits and vegetables, whole grains, and beans. ? Limiting foods that are high in fat and processed sugars, such as fried and sweet foods.  Do not play contact sports or participate in other activities that have a high risk for injury. What other precautions are important if on warfarin therapy? If you are taking a type of anticoagulant called warfarin, make sure you:  Work with a diet and nutrition specialist (dietitian) to make an eating plan. Do not make any sudden changes to your diet after you have started your eating plan.  Do not drink alcohol. It can interfere with your medicine and increase your risk of an injury that causes bleeding.  Get regular blood tests as told by your health care provider. What are some questions to ask my health care provider?  Why do I need anticoagulant therapy?  What is the best anticoagulant therapy for my condition?  How long will I need anticoagulant therapy?  What are the side effects of anticoagulant therapy?  When should I take my medicine? What should I do if I forget to take it?  Will I need to have regular blood tests?  Do I need to change my diet? Are there foods or drinks that I should avoid?  What activities are safe for me?  What should I do if I want to get pregnant? Contact a health care provider if:  You miss a dose of medicine: ? And you are not sure what to do. ? For more than one day.  You have: ? Menstrual bleeding that is heavier than normal. ? Bloody or brown urine. ? Easy bruising. ? Black and tarry stool or bright red stool. ? Side effects  from your medicine.  You feel weak or dizzy.  You become pregnant. Get help right away if:  You have bleeding that will not stop within 20 minutes from: ? The nose. ? The gums. ? A cut on the skin.  You have a severe headache or stomachache.  You vomit or cough up blood.  You fall or hit your head. Summary  Anticoagulant therapy, also called blood thinner therapy, is medicine that helps to prevent and treat blood clots.  Anticoagulants work in different ways to prevent blood clots. They also have different risks and side effects.  Talk with your health care provider about any precautions that you should take while on anticoagulant therapy. This information is not intended to replace advice given to you by your health care provider. Make sure you discuss any questions you have with your health care provider. Document Revised: 03/08/2019 Document Reviewed: 02/02/2017 Elsevier Patient Education  [place].

## 2020-08-22 ENCOUNTER — Ambulatory Visit (INDEPENDENT_AMBULATORY_CARE_PROVIDER_SITE_OTHER): Payer: Medicare Other | Admitting: Nurse Practitioner

## 2020-08-22 ENCOUNTER — Encounter: Payer: Self-pay | Admitting: Nurse Practitioner

## 2020-08-22 ENCOUNTER — Other Ambulatory Visit: Payer: Self-pay

## 2020-08-22 VITALS — BP 137/68 | HR 67 | Temp 98.6°F | Wt 173.4 lb

## 2020-08-22 DIAGNOSIS — Z86718 Personal history of other venous thrombosis and embolism: Secondary | ICD-10-CM | POA: Diagnosis not present

## 2020-08-22 LAB — COAGUCHEK XS/INR WAIVED
INR: 2 — ABNORMAL HIGH (ref 0.9–1.1)
Prothrombin Time: 24.4 s

## 2020-08-22 NOTE — Assessment & Plan Note (Signed)
Chronic, stable.  Goal INR 2.0 - 3.0.  Today INR is 2.0  Will continue dosing as prescribed: 2.5 mg Monday, Wednesday, and Friday.  5 mg Tuesday, Thursday, Saturday, and Sunday.  Follow up INR in 4 weeks.  With any bruising/bledding or calf pain/redness, shortness of breath, return to clinic sooner.

## 2020-08-22 NOTE — Patient Instructions (Signed)
Bleeding Precautions When on Anticoagulant Therapy, Adult Anticoagulant therapy, also called blood thinner therapy, is medicine that helps to prevent and treat blood clots. The medicine works by stopping blood clots from forming or growing. Blood clots that form in your blood vessels can be dangerous. They can break loose and travel to the heart, lungs, or brain. This increases the risk of a heart attack, stroke, or blocked lung artery (pulmonary embolism). Anticoagulants also increase the risk of bleeding. Try to protect yourself from cuts and other injuries that can cause bleeding. It is important to take anticoagulants exactly as told by your health care provider. Why do I need to be on anticoagulant therapy? You may need this medicine if you are at risk of developing a blood clot. Conditions that increase your risk of a blood clot include:  Being born with heart disease or a heart malformation (congenital heart disease).  Developing heart disease.  Having had surgery, such as valve replacement.  Having had a serious accident or other type of severe injury (trauma).  Having certain types of cancer.  Having certain diseases that can increase blood clotting.  Having a high risk of stroke or heart attack.  Having atrial fibrillation (AF). What are the common anticoagulant medicines? There are several types of anticoagulant medicines. The most common types are:  Medicines that you take by mouth (oral medicines), such as: ? Warfarin. ? Novel oral anticoagulants (NOACs), such as:  Direct thrombin inhibitors (dabigatran).  Factor Xa inhibitors (apixaban, edoxaban, and rivaroxaban).  Injections, such as: ? Unfractionated heparin. ? Low molecular weight heparin. These anticoagulants work in different ways to prevent blood clots. They also have different risks and side effects. What do I need to remember while on anticoagulant therapy? Taking anticoagulants  Take your medicine at the  same time every day. If you forget to take your medicine, take it as soon as you remember. Do not double your dosage of medicine if you miss a whole day. Take your normal dose and call your health care provider.  Do not stop taking your medicine unless your health care provider approves. Stopping the medicine can increase your risk of developing a blood clot. Taking other medicines  Take over-the-counter and prescriptions medicines only as told by your health care provider.  Do not take over-the-counter NSAIDs, including aspirin and ibuprofen, while you are on anticoagulant therapy. These medicines increase your risk of dangerous bleeding.  Get approval from your health care provider before you start taking any new medicines, vitamins, or herbal products. Some of these could interfere with your therapy. General instructions  Keep all follow-up visits as told by your health care provider. This is important.  If you are pregnant or trying to get pregnant, talk with a health care provider about anticoagulants. Some of these medicines are not safe to take during pregnancy.  Tell all health care providers, including your dentist, that you are on anticoagulant therapy. It is especially important to tell providers before you have any surgery, medical procedures, or dental work done. What precautions should I take?   Be very careful when using knives, scissors, or other sharp objects.  Use an electric razor instead of a blade.  Do not use toothpicks.  Use a soft-bristled toothbrush. Brush your teeth gently.  Always wear shoes outdoors and wear slippers indoors.  Be careful when cutting your fingernails and toenails.  Place bath mats in the bathroom. If possible, install handrails as well.  Wear gloves while you do  yard work.  Wear your seat belt.  Prevent falls by removing loose rugs and extension cords from areas where you walk. Use a cane or walker if you need it.  Avoid  constipation by: ? Drinking enough fluid to keep your urine clear or pale yellow. ? Eating foods that are high in fiber, such as fresh fruits and vegetables, whole grains, and beans. ? Limiting foods that are high in fat and processed sugars, such as fried and sweet foods.  Do not play contact sports or participate in other activities that have a high risk for injury. What other precautions are important if on warfarin therapy? If you are taking a type of anticoagulant called warfarin, make sure you:  Work with a diet and nutrition specialist (dietitian) to make an eating plan. Do not make any sudden changes to your diet after you have started your eating plan.  Do not drink alcohol. It can interfere with your medicine and increase your risk of an injury that causes bleeding.  Get regular blood tests as told by your health care provider. What are some questions to ask my health care provider?  Why do I need anticoagulant therapy?  What is the best anticoagulant therapy for my condition?  How long will I need anticoagulant therapy?  What are the side effects of anticoagulant therapy?  When should I take my medicine? What should I do if I forget to take it?  Will I need to have regular blood tests?  Do I need to change my diet? Are there foods or drinks that I should avoid?  What activities are safe for me?  What should I do if I want to get pregnant? Contact a health care provider if:  You miss a dose of medicine: ? And you are not sure what to do. ? For more than one day.  You have: ? Menstrual bleeding that is heavier than normal. ? Bloody or brown urine. ? Easy bruising. ? Black and tarry stool or bright red stool. ? Side effects from your medicine.  You feel weak or dizzy.  You become pregnant. Get help right away if:  You have bleeding that will not stop within 20 minutes from: ? The nose. ? The gums. ? A cut on the skin.  You have a severe headache or  stomachache.  You vomit or cough up blood.  You fall or hit your head. Summary  Anticoagulant therapy, also called blood thinner therapy, is medicine that helps to prevent and treat blood clots.  Anticoagulants work in different ways to prevent blood clots. They also have different risks and side effects.  Talk with your health care provider about any precautions that you should take while on anticoagulant therapy. This information is not intended to replace advice given to you by your health care provider. Make sure you discuss any questions you have with your health care provider. Document Revised: 03/08/2019 Document Reviewed: 02/02/2017 Elsevier Patient Education  2020 Elsevier Inc.  

## 2020-08-22 NOTE — Progress Notes (Signed)
BP 137/68 (BP Location: Right Arm, Cuff Size: Normal)   Pulse 67   Temp 98.6 F (37 C) (Oral)   Wt 173 lb 6.4 oz (78.7 kg)   SpO2 95%   BMI 23.85 kg/m    Subjective:    Patient ID: Thomas [name], male    DOB: [birth year], 74 y.o.   MRN: [id number]  HPI: Thomas [name] is a 74 y.o. male presenting for INR check  Chief Complaint  Patient presents with  . H/O DVT    pt currently taking 5 mg 4 days per week and 2.5 mg 3 days per week    Coumadin Management.  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  Present Coumadin dose: 2.5 mg on Monday, Wednesday, and Friday.  5 mg on Tuesday, Thursday, Saturday, and Sunday Goal: 2.0-3.0 2.0-3.0 Excessive bruising: no Nose bleeding: no Rectal bleeding: no Prolonged menstrual cycles: N/A Eating diet with consistent amounts of foods containing Vitamin K:no Any recent antibiotic use? no  Allergies  Allergen Reactions  . Dexamethasone Other (See Comments)    Renal Insufficiency   Outpatient Encounter Medications as of 08/22/2020  Medication Sig  . amLODipine (NORVASC) 2.5 MG tablet Take 1 tablet (2.5 mg total) by mouth daily.  Marland Kitchen atorvastatin (LIPITOR) 10 MG tablet Take 1 tablet (10 mg total) by mouth daily.  . diphenhydrAMINE-PE-APAP (EQ SEVERE ALLERGY & SINUS) 25-5-325 MG TABS Take 1 tablet by mouth as needed.  . montelukast (SINGULAIR) 10 MG tablet TAKE 1 TABLET BY MOUTH EVERYDAY AT BEDTIME  . Pseudoeph-Doxylamine-DM-APAP (DAYQUIL/NYQUIL COLD/FLU RELIEF PO) Take by mouth as needed.  . warfarin (COUMADIN) 5 MG tablet TAKE 1 TABLET BY MOUTH EVERY DAY   No facility-administered encounter medications on file as of 08/22/2020.   Patient Active Problem List   Diagnosis Date Noted  . Impaired fasting glucose 04/03/2020  . History of basal cell carcinoma (BCC) excision 09/09/2018  . 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 Airway hyperreactivity 05/03/2015  . Clinical  depression 05/03/2015  . Brash 05/03/2015  . H/O deep venous thrombosis 05/03/2015  . Hyperlipidemia 05/03/2015  . Essential hypertension 05/03/2015  . Healed or old pulmonary embolism 05/03/2015  . Allergic rhinitis 03/21/2012   Past Medical History:  Diagnosis Date  . Clotting disorder ([place])   . Hyperlipidemia   . Hypertension   . Sinus infection   . Tinnitus    Relevant past medical, surgical, family and social history reviewed and updated as indicated. Interim medical history since our last visit reviewed. Allergies and medications reviewed and updated.  Review of Systems  Constitutional: Negative.   Respiratory: Negative.   Cardiovascular: Negative.   Musculoskeletal: Negative.   Skin: Negative.   Hematological: Negative.  Does not bruise/bleed easily.  Psychiatric/Behavioral: Negative.     Per HPI unless specifically indicated above     Objective:    BP 137/68 (BP Location: Right Arm, Cuff Size: Normal)   Pulse 67   Temp 98.6 F (37 C) (Oral)   Wt 173 lb 6.4 oz (78.7 kg)   SpO2 95%   BMI 23.85 kg/m   Wt Readings from Last 3 Encounters:  08/22/20 173 lb 6.4 oz (78.7 kg)  08/08/20 172 lb (78 kg)  07/05/20 170 lb (77.1 kg)    Physical Exam Vitals and nursing note reviewed.  Constitutional:      General: He is not in acute distress.    Appearance: Normal appearance. He is not  toxic-appearing.  Cardiovascular:     Rate and Rhythm: Normal rate.  Pulmonary:     Effort: Pulmonary effort is normal. No respiratory distress.  Musculoskeletal:        General: Normal range of motion.  Skin:    General: Skin is warm and dry.     Coloration: Skin is not jaundiced or pale.     Findings: No bruising.  Neurological:     Mental Status: He is alert.  Psychiatric:        Mood and Affect: Mood normal.        Behavior: Behavior normal.        Thought Content: Thought content normal.        Judgment: Judgment normal.     Results for orders placed or performed in  visit on 08/08/20  CoaguChek XS/INR Waived (STAT)  Result Value Ref Range   INR 3.1 (H) 0.9 - 1.1   Prothrombin Time 37.4 sec      Assessment & Plan:   Problem List Items Addressed This Visit      Other   H/O deep venous thrombosis - Primary    Chronic, stable.  Goal INR 2.0 - 3.0.  Today INR is 2.0  Will continue dosing as prescribed: 2.5 mg Monday, Wednesday, and Friday.  5 mg Tuesday, Thursday, Saturday, and Sunday.  Follow up INR in 4 weeks.  With any bruising/bledding or calf pain/redness, shortness of breath, return to clinic sooner.      Relevant Orders   CoaguChek XS/INR Waived       Follow up plan: Return in about 4 weeks (around 09/19/2020) for INR check.

## 2020-08-28 ENCOUNTER — Other Ambulatory Visit: Payer: Self-pay | Admitting: Family Medicine

## 2020-08-28 NOTE — Telephone Encounter (Signed)
Approved per protocol.  Requested Prescriptions  Pending Prescriptions Disp Refills  . montelukast (SINGULAIR) 10 MG tablet [Pharmacy Med Name: MONTELUKAST SOD 10 MG TABLET] 30 tablet 3    Sig: TAKE 1 TABLET BY MOUTH EVERYDAY AT BEDTIME     Pulmonology:  Leukotriene Inhibitors Passed - 08/28/2020 11:34 AM      Passed - Valid encounter within last 12 months    Recent Outpatient Visits          6 days ago H/O deep venous thrombosis   [facility] Eulogio Bear, NP   2 weeks ago H/O deep venous thrombosis   [facility] Eulogio Bear, NP   1 month ago H/O deep venous thrombosis   [place], [place], [place]   2 months ago H/O deep venous thrombosis   [facility] Merrie Roof [place], [place]   3 months ago H/O deep venous thrombosis   [facility], Lilia Argue, [place]      Future Appointments            In 3 weeks Eulogio Bear, NP [facility], [place]   In 5 months  [facility], [place]

## 2020-09-23 ENCOUNTER — Encounter: Payer: Self-pay | Admitting: Nurse Practitioner

## 2020-09-23 ENCOUNTER — Ambulatory Visit (INDEPENDENT_AMBULATORY_CARE_PROVIDER_SITE_OTHER): Payer: Medicare Other | Admitting: Nurse Practitioner

## 2020-09-23 ENCOUNTER — Other Ambulatory Visit: Payer: Self-pay

## 2020-09-23 VITALS — BP 136/70 | HR 94 | Temp 98.5°F | Wt 173.6 lb

## 2020-09-23 DIAGNOSIS — Z86718 Personal history of other venous thrombosis and embolism: Secondary | ICD-10-CM

## 2020-09-23 LAB — COAGUCHEK XS/INR WAIVED
INR: 2.5 — ABNORMAL HIGH (ref 0.9–1.1)
Prothrombin Time: 30.4 s

## 2020-09-23 NOTE — Progress Notes (Signed)
BP 136/70   Pulse 94   Temp 98.5 F (36.9 C) (Oral)   Wt 173 lb 9.6 oz (78.7 kg)   SpO2 96%   BMI 23.87 kg/m    Subjective:    Patient ID: Thomas [name], male    DOB: Jun 25, 1946, 74 y.o.   MRN: [id number]  HPI: Thomas [name] is a 74 y.o. male presenting for INR check.  Chief Complaint  Patient presents with  . DVT    2.5 mg on M, W, F and 5 mg on Tu, Th, Sa, Sun   Coumadin Management.  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  Present Coumadin dose: 2.5 mg on Monday, Wednesday, and Friday.  5 mg on Tuesday, Thursday, Saturday, and Sunday.  Goal:  2.0-3.0 Excessive bruising: no Nose bleeding: no Rectal bleeding: no Prolonged menstrual cycles: N/A Eating diet with consistent amounts of foods containing Vitamin K:yes Any recent antibiotic use? no  Allergies  Allergen Reactions  . Dexamethasone Other (See Comments)    Renal Insufficiency   Outpatient Encounter Medications as of 09/23/2020  Medication Sig  . amLODipine (NORVASC) 2.5 MG tablet Take 1 tablet (2.5 mg total) by mouth daily.  Marland Kitchen atorvastatin (LIPITOR) 10 MG tablet Take 1 tablet (10 mg total) by mouth daily.  . diphenhydrAMINE-PE-APAP (EQ SEVERE ALLERGY & SINUS) 25-5-325 MG TABS Take 1 tablet by mouth as needed.  . montelukast (SINGULAIR) 10 MG tablet TAKE 1 TABLET BY MOUTH EVERYDAY AT BEDTIME  . Pseudoeph-Doxylamine-DM-APAP (DAYQUIL/NYQUIL COLD/FLU RELIEF PO) Take by mouth as needed.  . warfarin (COUMADIN) 5 MG tablet TAKE 1 TABLET BY MOUTH EVERY DAY   No facility-administered encounter medications on file as of 09/23/2020.   Patient Active Problem List   Diagnosis Date Noted  . Impaired fasting glucose 04/03/2020  . History of basal cell carcinoma (BCC) excision 09/09/2018  . 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 Airway hyperreactivity 05/03/2015  . 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  . Brash 05/03/2015  . H/O deep venous thrombosis  05/03/2015  . Hyperlipidemia 05/03/2015  . Essential hypertension 05/03/2015  . Healed or old pulmonary embolism 05/03/2015  . Allergic rhinitis 03/21/2012   Past Medical History:  Diagnosis Date  . Clotting disorder ([place])   . Hyperlipidemia   . Hypertension   . Sinus infection   . Tinnitus    Relevant past medical, surgical, family and social history reviewed and updated as indicated. Interim medical history since our last visit reviewed.  Review of Systems  Constitutional: Negative.   Respiratory: Negative.   Cardiovascular: Negative.   Skin: Negative.   Neurological: Negative.   Hematological: Negative.   Psychiatric/Behavioral: Negative.     Per HPI unless specifically indicated above     Objective:    BP 136/70   Pulse 94   Temp 98.5 F (36.9 C) (Oral)   Wt 173 lb 9.6 oz (78.7 kg)   SpO2 96%   BMI 23.87 kg/m   Wt Readings from Last 3 Encounters:  09/23/20 173 lb 9.6 oz (78.7 kg)  08/22/20 173 lb 6.4 oz (78.7 kg)  08/08/20 172 lb (78 kg)    Physical Exam Vitals and nursing note reviewed.  Constitutional:      General: He is not in acute distress.    Appearance: Normal appearance. He is not toxic-appearing.  Cardiovascular:     Rate and Rhythm: Normal rate and regular rhythm.     Heart sounds: Normal heart sounds. No  murmur heard.   Pulmonary:     Effort: Pulmonary effort is normal. No respiratory distress.     Breath sounds: Normal breath sounds. No wheezing.  Skin:    General: Skin is warm and dry.     Capillary Refill: Capillary refill takes [name] than 2 seconds.     Coloration: Skin is not jaundiced or pale.     Findings: No erythema.  Neurological:     General: No focal deficit present.     Mental Status: He is alert and oriented to person, place, and time.  Psychiatric:        Mood and Affect: Mood normal.        Behavior: Behavior normal.        Thought Content: Thought content normal.        Judgment: Judgment normal.     Results for  orders placed or performed in visit on 08/22/20  CoaguChek XS/INR Waived  Result Value Ref Range   INR 2.0 (H) 0.9 - 1.1   Prothrombin Time 24.4 sec      Assessment & Plan:   Problem List Items Addressed This Visit      Other   H/O deep venous thrombosis - Primary    Chronic, stable.  Goal INR 2.0 - 3.0.  Today INR is 2.5.  Will continue current dosing: 2.5 mg M, W, and F; 5 mg Tu, Th, Sat, Sun.  Follow up INR in 4 weeks.  With any uncontrolled/new bleeding/bruising, calf pain/redness, or shortness of breath, return to clinic sooner.      Relevant Orders   CoaguChek XS/INR Waived       Follow up plan: Return in about 4 weeks (around 10/21/2020) for INR, DM, HTN,HLD.

## 2020-09-23 NOTE — Assessment & Plan Note (Signed)
Chronic, stable.  Goal INR 2.0 - 3.0.  Today INR is 2.5.  Will continue current dosing: 2.5 mg M, W, and F; 5 mg Tu, Th, Sat, Sun.  Follow up INR in 4 weeks.  With any uncontrolled/new bleeding/bruising, calf pain/redness, or shortness of breath, return to clinic sooner.

## 2020-09-23 NOTE — Patient Instructions (Signed)
Vitamin K Foods and Warfarin Warfarin is a blood thinner (anticoagulant). Anticoagulant medicines help prevent the formation of blood clots. These medicines work by decreasing the activity of vitamin K, which promotes normal blood clotting. When you take warfarin, problems can occur from suddenly increasing or decreasing the amount of vitamin K that you eat from one day to the next. Problems may include:  Blood clots.  Bleeding. What general guidelines do I need to follow? To avoid problems when taking warfarin:  Eat a balanced diet that includes: ? Fresh fruits and vegetables. ? Whole grains. ? Low-fat dairy products. ? Lean proteins, such as fish, eggs, and lean cuts of meat.  Keep your intake of vitamin K consistent from day to day. To do this: ? Avoid eating large amounts of vitamin K one day and low amounts of vitamin K the next day. ? If you take a multivitamin that contains vitamin K, be sure to take it every day. ? Know which foods contain vitamin K. Use the lists below to understand serving sizes and the amount of vitamin K in one serving.  Avoid major changes in your diet. If you are going to change your diet, talk with your health care provider before making changes.  Work with a Financial planner (dietitian) to develop a meal plan that works best for you.  High vitamin K foods Foods that are high in vitamin K contain more than 100 mcg (micrograms) per serving. These include:  Broccoli (cooked) -  cup has 110 mcg.  Brussels sprouts (cooked) -  cup has 109 mcg.  Greens, beet (cooked) -  cup has 350 mcg.  Greens, collard (cooked) -  cup has 418 mcg.  Greens, turnip (cooked) -  cup has 265 mcg.  Green onions or scallions -  cup has 105 mcg.  Kale (fresh or frozen) -  cup has 531 mcg.  Parsley (raw) - 10 sprigs has 164 mcg.  Spinach (cooked) -  cup has 444 mcg.  Swiss chard (cooked) -  cup has 287 mcg. Moderate vitamin K foods Foods that have a  moderate amount of vitamin K contain 25-100 mcg per serving. These include:  Asparagus (cooked) - 5 spears have 38 mcg.  Black-eyed peas (dried) -  cup has 32 mcg.  Cabbage (cooked) -  cup has 37 mcg.  Kiwi fruit - 1 medium has 31 mcg.  Lettuce - 1 cup has 57-63 mcg.  Okra (frozen) -  cup has 44 mcg.  Prunes (dried) - 5 prunes have 25 mcg.  Watercress (raw) - 1 cup has 85 mcg. Low vitamin K foods Foods low in vitamin K contain less than 25 mcg per serving. These include:  Artichoke - 1 medium has 18 mcg.  Avocado - 1 oz. has 6 mcg.  Blueberries -  cup has 14 mcg.  Cabbage (raw) -  cup has 21 mcg.  Carrots (cooked) -  cup has 11 mcg.  Cauliflower (raw) -  cup has 11 mcg.  Cucumber with peel (raw) -  cup has 9 mcg.  Grapes -  cup has 12 mcg.  Mango - 1 medium has 9 mcg.  Nuts - 1 oz. has 15 mcg.  Pear - 1 medium has 8 mcg.  Peas (cooked) -  cup has 19 mcg.  Pickles - 1 spear has 14 mcg.  Pumpkin seeds - 1 oz. has 13 mcg.  Sauerkraut (canned) -  cup has 16 mcg.  Soybeans (cooked) -  cup has 16 mcg.  Tomato (raw) - 1 medium has 10 mcg.  Tomato sauce -  cup has 17 mcg. Vitamin K-free foods If a food contain less than 5 mcg per serving, it is considered to have no vitamin K. These foods include:  Bread and cereal products.  Cheese.  Eggs.  Fish and shellfish.  Meat and poultry.  Milk and dairy products.  Sunflower seeds. Actual amounts of vitamin K in foods may be different depending on processing. Talk with your dietitian about what foods you can eat and what foods you should avoid. This information is not intended to replace advice given to you by your health care provider. Make sure you discuss any questions you have with your health care provider. Document Revised: 10/29/2017 Document Reviewed: 02/19/2016 Elsevier Patient Education  2020 [employer].

## 2020-10-28 ENCOUNTER — Other Ambulatory Visit: Payer: Self-pay | Admitting: Family Medicine

## 2020-10-28 ENCOUNTER — Other Ambulatory Visit: Payer: Self-pay

## 2020-10-28 ENCOUNTER — Encounter: Payer: Self-pay | Admitting: Nurse Practitioner

## 2020-10-28 ENCOUNTER — Ambulatory Visit (INDEPENDENT_AMBULATORY_CARE_PROVIDER_SITE_OTHER): Payer: Medicare Other | Admitting: Nurse Practitioner

## 2020-10-28 VITALS — BP 125/76 | HR 91 | Temp 98.9°F | Wt 172.0 lb

## 2020-10-28 DIAGNOSIS — R7301 Impaired fasting glucose: Secondary | ICD-10-CM | POA: Diagnosis not present

## 2020-10-28 DIAGNOSIS — I10 Essential (primary) hypertension: Secondary | ICD-10-CM

## 2020-10-28 DIAGNOSIS — Z86718 Personal history of other venous thrombosis and embolism: Secondary | ICD-10-CM | POA: Diagnosis not present

## 2020-10-28 LAB — BAYER DCA HB A1C WAIVED: HB A1C (BAYER DCA - WAIVED): 5.5 % (ref <7.0)

## 2020-10-28 LAB — COAGUCHEK XS/INR WAIVED
INR: 2.5 — ABNORMAL HIGH (ref 0.9–1.1)
Prothrombin Time: 30.4 s

## 2020-10-28 NOTE — Assessment & Plan Note (Signed)
Chronic, stable.  Goal INR 2.0-3.0.  INR today 2.5.  We will continue current dosing-2.5 mg of Coumadin on Monday, Wednesday, Friday.  5 mg of Coumadin on Tuesday, Thursday, Saturday, Sunday.  Follow-up INR in 4 weeks.  With any uncontrolled/new bleeding or bruising, calf pain, tenderness, or redness, or shortness of breath return to clinic sooner than 4 weeks.

## 2020-10-28 NOTE — Assessment & Plan Note (Addendum)
Chronic, stable.  We will continue amlodipine 2.5 mg daily.  BMET checked today.  Follow-up in 6 months or sooner with any shortness of breath, chest pain, vision changes, or ongoing headaches.

## 2020-10-28 NOTE — Patient Instructions (Signed)
Bleeding Precautions When on Anticoagulant Therapy, Adult Anticoagulant therapy, also called blood thinner therapy, is medicine that helps to prevent and treat blood clots. The medicine works by stopping blood clots from forming or growing. Blood clots that form in your blood vessels can be dangerous. They can break loose and travel to the heart, lungs, or brain. This increases the risk of a heart attack, stroke, or blocked lung artery (pulmonary embolism). Anticoagulants also increase the risk of bleeding. Try to protect yourself from cuts and other injuries that can cause bleeding. It is important to take anticoagulants exactly as told by your health care provider. Why do I need to be on anticoagulant therapy? You may need this medicine if you are at risk of developing a blood clot. Conditions that increase your risk of a blood clot include:  Being born with heart disease or a heart malformation (congenital heart disease).  Developing heart disease.  Having had surgery, such as valve replacement.  Having had a serious accident or other type of severe injury (trauma).  Having certain types of cancer.  Having certain diseases that can increase blood clotting.  Having a high risk of stroke or heart attack.  Having atrial fibrillation (AF). What are the common anticoagulant medicines? There are several types of anticoagulant medicines. The most common types are:  Medicines that you take by mouth (oral medicines), such as: ? Warfarin. ? Novel oral anticoagulants (NOACs), such as:  Direct thrombin inhibitors (dabigatran).  Factor Xa inhibitors (apixaban, edoxaban, and rivaroxaban).  Injections, such as: ? Unfractionated heparin. ? Low molecular weight heparin. These anticoagulants work in different ways to prevent blood clots. They also have different risks and side effects. What do I need to remember while on anticoagulant therapy? Taking anticoagulants  Take your medicine at the  same time every day. If you forget to take your medicine, take it as soon as you remember. Do not double your dosage of medicine if you miss a whole day. Take your normal dose and call your health care provider.  Do not stop taking your medicine unless your health care provider approves. Stopping the medicine can increase your risk of developing a blood clot. Taking other medicines  Take over-the-counter and prescriptions medicines only as told by your health care provider.  Do not take over-the-counter NSAIDs, including aspirin and ibuprofen, while you are on anticoagulant therapy. These medicines increase your risk of dangerous bleeding.  Get approval from your health care provider before you start taking any new medicines, vitamins, or herbal products. Some of these could interfere with your therapy. General instructions  Keep all follow-up visits as told by your health care provider. This is important.  If you are pregnant or trying to get pregnant, talk with a health care provider about anticoagulants. Some of these medicines are not safe to take during pregnancy.  Tell all health care providers, including your dentist, that you are on anticoagulant therapy. It is especially important to tell providers before you have any surgery, medical procedures, or dental work done. What precautions should I take?   Be very careful when using knives, scissors, or other sharp objects.  Use an electric razor instead of a blade.  Do not use toothpicks.  Use a soft-bristled toothbrush. Brush your teeth gently.  Always wear shoes outdoors and wear slippers indoors.  Be careful when cutting your fingernails and toenails.  Place bath mats in the bathroom. If possible, install handrails as well.  Wear gloves while you do  yard work.  Wear your seat belt.  Prevent falls by removing loose rugs and extension cords from areas where you walk. Use a cane or walker if you need it.  Avoid  constipation by: ? Drinking enough fluid to keep your urine clear or pale yellow. ? Eating foods that are high in fiber, such as fresh fruits and vegetables, whole grains, and beans. ? Limiting foods that are high in fat and processed sugars, such as fried and sweet foods.  Do not play contact sports or participate in other activities that have a high risk for injury. What other precautions are important if on warfarin therapy? If you are taking a type of anticoagulant called warfarin, make sure you:  Work with a diet and nutrition specialist (dietitian) to make an eating plan. Do not make any sudden changes to your diet after you have started your eating plan.  Do not drink alcohol. It can interfere with your medicine and increase your risk of an injury that causes bleeding.  Get regular blood tests as told by your health care provider. What are some questions to ask my health care provider?  Why do I need anticoagulant therapy?  What is the best anticoagulant therapy for my condition?  How long will I need anticoagulant therapy?  What are the side effects of anticoagulant therapy?  When should I take my medicine? What should I do if I forget to take it?  Will I need to have regular blood tests?  Do I need to change my diet? Are there foods or drinks that I should avoid?  What activities are safe for me?  What should I do if I want to get pregnant? Contact a health care provider if:  You miss a dose of medicine: ? And you are not sure what to do. ? For more than one day.  You have: ? Menstrual bleeding that is heavier than normal. ? Bloody or brown urine. ? Easy bruising. ? Black and tarry stool or bright red stool. ? Side effects from your medicine.  You feel weak or dizzy.  You become pregnant. Get help right away if:  You have bleeding that will not stop within 20 minutes from: ? The nose. ? The gums. ? A cut on the skin.  You have a severe headache or  stomachache.  You vomit or cough up blood.  You fall or hit your head. Summary  Anticoagulant therapy, also called blood thinner therapy, is medicine that helps to prevent and treat blood clots.  Anticoagulants work in different ways to prevent blood clots. They also have different risks and side effects.  Talk with your health care provider about any precautions that you should take while on anticoagulant therapy. This information is not intended to replace advice given to you by your health care provider. Make sure you discuss any questions you have with your health care provider. Document Revised: 03/08/2019 Document Reviewed: 02/02/2017 Elsevier Patient Education  [employer].

## 2020-10-28 NOTE — Progress Notes (Signed)
BP 125/76   Pulse 91   Temp 98.9 F (37.2 C)   Wt 172 lb (78 kg)   SpO2 97%   BMI 23.65 kg/m    Subjective:    Patient ID: Thomas [name], male    DOB: October 13, 1946, 74 y.o.   MRN: [id number]  HPI: Thomas [name] is a 74 y.o. male presenting for follow up.  Chief Complaint  Patient presents with  . Hypertension  . Hyperlipidemia   COUMADIN MANAGEMENT The expected duration of coumadin treatment is lifelong. 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  Present Coumadin dose: Goal: 2.0-3.0 2.0-3.0 Excessive bruising: no Nose bleeding: no Rectal bleeding: no Prolonged menstrual cycles: N/A Eating diet with consistent amounts of foods containing Vitamin K:yes Any recent antibiotic use? no  IMPAIRED FASTING GLUCOSE HbA1C: 5.5% Lab Results  Component Value Date   HGBA1C 5.7 (H) 05/06/2020   Duration of elevated blood sugar: years Polydipsia: no Polyuria: no Weight change: no Visual disturbance: no Glucose Monitoring: no    Accucheck frequency: not checking Family history of diabetes: yes; sisters  HYPERTENSION Hypertension status: controlled  Satisfied with current treatment? no Duration of hypertension: chronic BP monitoring frequency:  not checking BP medication side effects:  no Medication compliance: excellent Aspirin: no Recurrent headaches: no Visual changes: no Palpitations: no Dyspnea: no Chest pain: no Lower extremity edema: no Dizzy/lightheaded: no  Allergies  Allergen Reactions  . Dexamethasone Other (See Comments)    Renal Insufficiency   Outpatient Encounter Medications as of 10/28/2020  Medication Sig  . amLODipine (NORVASC) 2.5 MG tablet TAKE 1 TABLET BY MOUTH EVERY DAY  . atorvastatin (LIPITOR) 10 MG tablet Take 1 tablet (10 mg total) by mouth daily.  . diphenhydrAMINE-PE-APAP (EQ SEVERE ALLERGY & SINUS) 25-5-325 MG TABS Take 1 tablet by mouth as needed.  . montelukast (SINGULAIR) 10 MG tablet TAKE 1 TABLET BY MOUTH EVERYDAY AT  BEDTIME  . Pseudoeph-Doxylamine-DM-APAP (DAYQUIL/NYQUIL COLD/FLU RELIEF PO) Take by mouth as needed.  . warfarin (COUMADIN) 5 MG tablet TAKE 1 TABLET BY MOUTH EVERY DAY   No facility-administered encounter medications on file as of 10/28/2020.   Patient Active Problem List   Diagnosis Date Noted  . Impaired fasting glucose 04/03/2020  . History of basal cell carcinoma (BCC) excision 09/09/2018  . 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 Airway hyperreactivity 05/03/2015  . 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  . Brash 05/03/2015  . H/O deep venous thrombosis 05/03/2015  . Hyperlipidemia 05/03/2015  . Essential hypertension 05/03/2015  . Healed or old pulmonary embolism 05/03/2015  . Allergic rhinitis 03/21/2012   Past Medical History:  Diagnosis Date  . Clotting disorder ([place])   . Hyperlipidemia   . Hypertension   . Sinus infection   . Tinnitus    Relevant past medical, surgical, family and social history reviewed and updated as indicated. Interim medical history since our last visit reviewed.  Review of Systems  Constitutional: Negative.  Negative for activity change, appetite change, fatigue and fever.  HENT: Negative.   Eyes: Negative.  Negative for visual disturbance.  Respiratory: Negative.  Negative for cough, chest tightness, shortness of breath and wheezing.   Cardiovascular: Negative.  Negative for chest pain, palpitations and leg swelling.  Gastrointestinal: Negative.   Endocrine: Negative.   Musculoskeletal: Negative.   Skin: Negative.  Negative for color change and rash.  Neurological: Negative.  Negative for dizziness, light-headedness and headaches.  Hematological: Negative.  Negative for adenopathy. Does not bruise/bleed easily.  Psychiatric/Behavioral: Negative.  Per HPI unless specifically indicated above     Objective:    BP 125/76   Pulse 91   Temp 98.9 F (37.2 C)   Wt 172 lb (78 kg)   SpO2 97%   BMI 23.65 kg/m   Wt Readings from Last 3  Encounters:  10/28/20 172 lb (78 kg)  09/23/20 173 lb 9.6 oz (78.7 kg)  08/22/20 173 lb 6.4 oz (78.7 kg)    Physical Exam Vitals and nursing note reviewed.  Constitutional:      General: He is not in acute distress.    Appearance: Normal appearance. He is not toxic-appearing.  HENT:     Head: Normocephalic and atraumatic.  Eyes:     General: No scleral icterus.       Right eye: No discharge.        Left eye: No discharge.     Extraocular Movements: Extraocular movements intact.     Pupils: Pupils are equal, round, and reactive to light.  Cardiovascular:     Rate and Rhythm: Normal rate and regular rhythm.     Heart sounds: Normal heart sounds. No murmur heard.   Pulmonary:     Effort: Pulmonary effort is normal. No respiratory distress.     Breath sounds: Normal breath sounds. No wheezing, rhonchi or rales.  Abdominal:     General: Abdomen is flat. Bowel sounds are normal.     Palpations: Abdomen is soft.  Musculoskeletal:        General: Normal range of motion.     Cervical back: Normal range of motion. No rigidity.     Right lower leg: No edema.     Left lower leg: No edema.  Lymphadenopathy:     Cervical: No cervical adenopathy.  Skin:    General: Skin is warm and dry.     Coloration: Skin is not jaundiced or pale.     Findings: No bruising or erythema.  Neurological:     General: No focal deficit present.     Mental Status: He is alert and oriented to person, place, and time.     Motor: No weakness.     Gait: Gait normal.  Psychiatric:        Mood and Affect: Mood normal.        Behavior: Behavior normal.        Thought Content: Thought content normal.        Judgment: Judgment normal.        Assessment & Plan:   Problem List Items Addressed This Visit      Cardiovascular and Mediastinum   Essential hypertension    Chronic, stable.  We will continue amlodipine 2.5 mg daily.  BMET checked today.  Follow-up in 6 months or sooner with any shortness of  breath, chest pain, vision changes, or ongoing headaches.      Relevant Orders   CBC with Differential/Platelet   Basic Metabolic Panel (BMET)     Endocrine   Impaired fasting glucose    Chronic, ongoing.  A1c today 5.5%.  We will continue to follow every 6 months.      Relevant Orders   Bayer DCA Hb A1c Waived     Other   H/O deep venous thrombosis - Primary    Chronic, stable.  Goal INR 2.0-3.0.  INR today 2.5.  We will continue current dosing-2.5 mg of Coumadin on Monday, Wednesday, Friday.  5 mg of Coumadin on Tuesday, Thursday, Saturday, Sunday.  Follow-up INR in  4 weeks.  With any uncontrolled/new bleeding or bruising, calf pain, tenderness, or redness, or shortness of breath return to clinic sooner than 4 weeks.      Relevant Orders   CoaguChek XS/INR Waived       Follow up plan: Return in about 4 weeks (around 11/25/2020) for PT/INR check.

## 2020-10-28 NOTE — Assessment & Plan Note (Signed)
Chronic, ongoing.  A1c today 5.5%.  We will continue to follow every 6 months. 

## 2020-10-29 ENCOUNTER — Telehealth: Payer: Self-pay | Admitting: Nurse Practitioner

## 2020-10-29 LAB — CBC WITH DIFFERENTIAL/PLATELET
Basophils Absolute: 0.1 10*3/uL (ref 0.0–0.2)
Basos: 0 %
EOS (ABSOLUTE): 0 10*3/uL (ref 0.0–0.4)
Eos: 0 %
Hematocrit: 42.1 % (ref 37.5–51.0)
Hemoglobin: 13.2 g/dL (ref 13.0–17.7)
Immature Grans (Abs): 0.1 10*3/uL (ref 0.0–0.1)
Immature Granulocytes: 1 %
Lymphocytes Absolute: 1.2 10*3/uL (ref 0.7–3.1)
Lymphs: 7 %
MCH: 24.9 pg — ABNORMAL LOW (ref 26.6–33.0)
MCHC: 31.4 g/dL — ABNORMAL LOW (ref 31.5–35.7)
MCV: 79 fL (ref 79–97)
Monocytes Absolute: 0.6 10*3/uL (ref 0.1–0.9)
Monocytes: 3 %
Neutrophils Absolute: 16.1 10*3/uL — ABNORMAL HIGH (ref 1.4–7.0)
Neutrophils: 89 %
Platelets: 222 10*3/uL (ref 150–450)
RBC: 5.31 x10E6/uL (ref 4.14–5.80)
RDW: 13.7 % (ref 11.6–15.4)
WBC: 18.2 10*3/uL — ABNORMAL HIGH (ref 3.4–10.8)

## 2020-10-29 LAB — BASIC METABOLIC PANEL
BUN/Creatinine Ratio: 13 (ref 10–24)
BUN: 15 mg/dL (ref 8–27)
CO2: 22 mmol/L (ref 20–29)
Calcium: 9.5 mg/dL (ref 8.6–10.2)
Chloride: 105 mmol/L (ref 96–106)
Creatinine, Ser: 1.14 mg/dL (ref 0.76–1.27)
GFR calc Af Amer: 73 mL/min/{1.73_m2} (ref >59)
GFR calc non Af Amer: 63 mL/min/{1.73_m2} (ref >59)
Glucose: 108 mg/dL — ABNORMAL HIGH (ref 65–99)
Potassium: 4.5 mmol/L (ref 3.5–5.2)
Sodium: 144 mmol/L (ref 134–144)

## 2020-10-29 NOTE — Telephone Encounter (Signed)
Patient given information below from [place]. He says the sinus issues has been ongoing since a child in elementary school. He says he's stopped up, but nothing more than usual.

## 2020-10-29 NOTE — Telephone Encounter (Signed)
Called patient to discuss labs - no answer.  If patient returns call, please inform him that some of his blood cells were a bit elevated which sometimes indicates infection (either bacterial or viral).  I remember him mentioning that he was having some sinus issues, if these have been going on for >10 days, he probably needs an antibiotic to fight this off.  Please let me know if this is the case. Either way, we should plan to recheck on his blood counts at his next INR check to make sure they have come back down to normal.

## 2020-10-30 NOTE — Telephone Encounter (Signed)
Noted, thank you

## 2020-11-25 ENCOUNTER — Ambulatory Visit (INDEPENDENT_AMBULATORY_CARE_PROVIDER_SITE_OTHER): Payer: Medicare Other | Admitting: Nurse Practitioner

## 2020-11-25 ENCOUNTER — Encounter: Payer: Self-pay | Admitting: Nurse Practitioner

## 2020-11-25 ENCOUNTER — Other Ambulatory Visit: Payer: Self-pay

## 2020-11-25 VITALS — BP 168/86 | HR 66 | Temp 98.1°F | Wt 173.4 lb

## 2020-11-25 DIAGNOSIS — Z86711 Personal history of pulmonary embolism: Secondary | ICD-10-CM | POA: Diagnosis not present

## 2020-11-25 DIAGNOSIS — D72820 Lymphocytosis (symptomatic): Secondary | ICD-10-CM

## 2020-11-25 DIAGNOSIS — Z86718 Personal history of other venous thrombosis and embolism: Secondary | ICD-10-CM | POA: Diagnosis not present

## 2020-11-25 DIAGNOSIS — D6859 Other primary thrombophilia: Secondary | ICD-10-CM

## 2020-11-25 LAB — COAGUCHEK XS/INR WAIVED
INR: 2.2 — ABNORMAL HIGH (ref 0.9–1.1)
Prothrombin Time: 26.5 s

## 2020-11-25 NOTE — Assessment & Plan Note (Signed)
Chronic, ongoing.  Continues on Coumadin for prevention.  Previously followed by Dr. Adalberto Cole.

## 2020-11-25 NOTE — Patient Instructions (Signed)
New provider -- Santiago Glad starts at end of January   Vitamin K Foods and Warfarin Warfarin is a blood thinner (anticoagulant). Anticoagulant medicines help prevent the formation of blood clots. These medicines work by decreasing the activity of vitamin K, which promotes normal blood clotting. When you take warfarin, problems can occur from suddenly increasing or decreasing the amount of vitamin K that you eat from one day to the next. Problems may include:  Blood clots.  Bleeding. What general guidelines do I need to follow? To avoid problems when taking warfarin:  Eat a balanced diet that includes: ? Fresh fruits and vegetables. ? Whole grains. ? Low-fat dairy products. ? Lean proteins, such as fish, eggs, and lean cuts of meat.  Keep your intake of vitamin K consistent from day to day. To do this: ? Avoid eating large amounts of vitamin K one day and low amounts of vitamin K the next day. ? If you take a multivitamin that contains vitamin K, be sure to take it every day. ? Know which foods contain vitamin K. Use the lists below to understand serving sizes and the amount of vitamin K in one serving.  Avoid major changes in your diet. If you are going to change your diet, talk with your health care provider before making changes.  Work with a Financial planner (dietitian) to develop a meal plan that works best for you.  High vitamin K foods Foods that are high in vitamin K contain more than 100 mcg (micrograms) per serving. These include:  Broccoli (cooked) -  cup has 110 mcg.  Brussels sprouts (cooked) -  cup has 109 mcg.  Greens, beet (cooked) -  cup has 350 mcg.  Greens, collard (cooked) -  cup has 418 mcg.  Greens, turnip (cooked) -  cup has 265 mcg.  Green onions or scallions -  cup has 105 mcg.  Kale (fresh or frozen) -  cup has 531 mcg.  Parsley (raw) - 10 sprigs has 164 mcg.  Spinach (cooked) -  cup has 444 mcg.  Swiss chard (cooked) -  cup has 287  mcg. Moderate vitamin K foods Foods that have a moderate amount of vitamin K contain 25-100 mcg per serving. These include:  Asparagus (cooked) - 5 spears have 38 mcg.  Black-eyed peas (dried) -  cup has 32 mcg.  Cabbage (cooked) -  cup has 37 mcg.  Kiwi fruit - 1 medium has 31 mcg.  Lettuce - 1 cup has 57-63 mcg.  Okra (frozen) -  cup has 44 mcg.  Prunes (dried) - 5 prunes have 25 mcg.  Watercress (raw) - 1 cup has 85 mcg. Low vitamin K foods Foods low in vitamin K contain less than 25 mcg per serving. These include:  Artichoke - 1 medium has 18 mcg.  Avocado - 1 oz. has 6 mcg.  Blueberries -  cup has 14 mcg.  Cabbage (raw) -  cup has 21 mcg.  Carrots (cooked) -  cup has 11 mcg.  Cauliflower (raw) -  cup has 11 mcg.  Cucumber with peel (raw) -  cup has 9 mcg.  Grapes -  cup has 12 mcg.  Mango - 1 medium has 9 mcg.  Nuts - 1 oz. has 15 mcg.  Pear - 1 medium has 8 mcg.  Peas (cooked) -  cup has 19 mcg.  Pickles - 1 spear has 14 mcg.  Pumpkin seeds - 1 oz. has 13 mcg.  Sauerkraut (canned) -  cup has  16 mcg.  Soybeans (cooked) -  cup has 16 mcg.  Tomato (raw) - 1 medium has 10 mcg.  Tomato sauce -  cup has 17 mcg. Vitamin K-free foods If a food contain less than 5 mcg per serving, it is considered to have no vitamin K. These foods include:  Bread and cereal products.  Cheese.  Eggs.  Fish and shellfish.  Meat and poultry.  Milk and dairy products.  Sunflower seeds. Actual amounts of vitamin K in foods may be different depending on processing. Talk with your dietitian about what foods you can eat and what foods you should avoid. This information is not intended to replace advice given to you by your health care provider. Make sure you discuss any questions you have with your health care provider. Document Revised: 10/29/2017 Document Reviewed: 02/19/2016 Elsevier Patient Education  2020 [employer].

## 2020-11-25 NOTE — Assessment & Plan Note (Signed)
Educated patient on this finding, will recheck CBC today.  If ongoing elevations consider further work-up to include repeat labs + imaging and urine testing.  Consider referral to hematology.

## 2020-11-25 NOTE — Assessment & Plan Note (Signed)
Chronic, stable.  Goal INR 2.0-3.0.  INR today 2.2.  Continue current dosing-2.5 mg of Coumadin on Monday, Wednesday, Friday & 5 mg of Coumadin on Tuesday, Thursday, Saturday, Sunday.  Follow-up INR in 4 weeks.

## 2020-11-25 NOTE — Progress Notes (Signed)
BP (!) 168/86   Pulse 66   Temp 98.1 F (36.7 C)   Wt 173 lb 6 oz (78.6 kg)   SpO2 97%   BMI 23.84 kg/m    Subjective:    Patient ID: Thomas [name], male    DOB: 09/08/1946, 74 y.o.   MRN: [id number]  CC: Coumadin management  HPI: This patient is a 74 y.o. male who presents for coumadin management. 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  His last labs on 10/28/20 noted INR 2.5 and CBC noted WBC elevation at 18.2 with neutrophils 16.1 -- he denies any recent infection (UTI or URI).  Denies night sweats, fever, fatigue, or weight loss.  Present Coumadin dose: 2.5 mg of Coumadin on Monday, Wednesday, Friday &  5 mg of Coumadin on Tuesday, Thursday, Saturday, Sunday Goal: 2.0-3.0 2.0-3.0 Excessive bruising: no Nose bleeding: no Rectal bleeding: no Prolonged menstrual cycles: N/A Eating diet with consistent amounts of foods containing Vitamin K:yes Any recent antibiotic use? no  Relevant past medical, surgical, family and social history reviewed and updated as indicated. Interim medical history since our last visit reviewed. Allergies and medications reviewed and updated.  ROS: Per HPI unless specifically indicated above     Objective:    BP (!) 168/86   Pulse 66   Temp 98.1 F (36.7 C)   Wt 173 lb 6 oz (78.6 kg)   SpO2 97%   BMI 23.84 kg/m   Wt Readings from Last 3 Encounters:  11/25/20 173 lb 6 oz (78.6 kg)  10/28/20 172 lb (78 kg)  09/23/20 173 lb 9.6 oz (78.7 kg)     General: Well appearing, well nourished in no distress.  Normal mood and affect.  No lymphadenopathy noted.  Clear lung sounds throughout. Skin: No excessive bruising or rash  Last INR: 2.5    Last CBC:  Lab Results  Component Value Date   WBC 18.2 (H) 10/28/2020   HGB 13.2 10/28/2020   HCT 42.1 10/28/2020   MCV 79 10/28/2020   PLT 222 10/28/2020    Results for  orders placed or performed in visit on 80/99/83  Basic Metabolic Panel (BMET)  Result Value Ref Range   Glucose 108 (H) 65 - 99 mg/dL   BUN 15 8 - 27 mg/dL   Creatinine, Ser 1.14 0.76 - 1.27 mg/dL   GFR calc non Af Amer 63 >59 mL/min/1.73   GFR calc Af Amer 73 >59 mL/min/1.73   BUN/Creatinine Ratio 13 10 - 24   Sodium 144 134 - 144 mmol/L   Potassium 4.5 3.5 - 5.2 mmol/L   Chloride 105 96 - 106 mmol/L   CO2 22 20 - 29 mmol/L   Calcium 9.5 8.6 - 10.2 mg/dL  CoaguChek XS/INR Waived  Result Value Ref Range   INR 2.5 (H) 0.9 - 1.1   Prothrombin Time 30.4 sec  Bayer DCA Hb A1c Waived  Result Value Ref Range   HB A1C (BAYER DCA - WAIVED) 5.5 <7.0 %  CBC with Differential/Platelet  Result Value Ref Range   WBC 18.2 (H) 3.4 - 10.8 x10E3/uL   RBC 5.31 4.14 - 5.80 x10E6/uL   Hemoglobin 13.2 13.0 - 17.7 g/dL   Hematocrit 42.1 37.5 - 51.0 %   MCV 79 79 - 97 fL   MCH 24.9 (L) 26.6 - 33.0 pg   MCHC 31.4 (L) 31.5 - 35.7  g/dL   RDW 13.7 11.6 - 15.4 %   Platelets 222 150 - 450 x10E3/uL   Neutrophils 89 Not Estab. %   Lymphs 7 Not Estab. %   Monocytes 3 Not Estab. %   Eos 0 Not Estab. %   Basos 0 Not Estab. %   Neutrophils Absolute 16.1 (H) 1.4 - 7.0 x10E3/uL   Lymphocytes Absolute 1.2 0.7 - 3.1 x10E3/uL   Monocytes Absolute 0.6 0.1 - 0.9 x10E3/uL   EOS (ABSOLUTE) 0.0 0.0 - 0.4 x10E3/uL   Basophils Absolute 0.1 0.0 - 0.2 x10E3/uL   Immature Granulocytes 1 Not Estab. %   Immature Grans (Abs) 0.1 0.0 - 0.1 x10E3/uL       Assessment:     ICD-10-CM   1. Antithrombin 3 deficiency ([facility])  D68.59 CoaguChek XS/INR Waived  2. H/O deep venous thrombosis  Z86.718 CoaguChek XS/INR Waived  3. Healed or old pulmonary embolism  Z86.711   4. Lymphocytosis  D72.820 CBC with Differential/Platelet    Plan:   Discussed current plan face-to-face with patient. For coumadin dosing, elected to continue current dose. Will plan to recheck INR in 1 month.  Recheck CBC today due to recent elevation WBC --  if ongoing elevation consider further work-up and hematology visit.

## 2020-11-25 NOTE — Assessment & Plan Note (Signed)
On lifelong Coumadin.  INR stable today, return in 4 weeks.

## 2020-11-26 LAB — CBC WITH DIFFERENTIAL/PLATELET
Basophils Absolute: 0.1 10*3/uL (ref 0.0–0.2)
Basos: 1 %
EOS (ABSOLUTE): 0.3 10*3/uL (ref 0.0–0.4)
Eos: 4 %
Hematocrit: 38.7 % (ref 37.5–51.0)
Hemoglobin: 12.2 g/dL — ABNORMAL LOW (ref 13.0–17.7)
Immature Grans (Abs): 0 10*3/uL (ref 0.0–0.1)
Immature Granulocytes: 0 %
Lymphocytes Absolute: 2 10*3/uL (ref 0.7–3.1)
Lymphs: 25 %
MCH: 24.9 pg — ABNORMAL LOW (ref 26.6–33.0)
MCHC: 31.5 g/dL (ref 31.5–35.7)
MCV: 79 fL (ref 79–97)
Monocytes Absolute: 0.6 10*3/uL (ref 0.1–0.9)
Monocytes: 7 %
Neutrophils Absolute: 4.9 10*3/uL (ref 1.4–7.0)
Neutrophils: 63 %
Platelets: 201 10*3/uL (ref 150–450)
RBC: 4.9 x10E6/uL (ref 4.14–5.80)
RDW: 13.8 % (ref 11.6–15.4)
WBC: 7.8 10*3/uL (ref 3.4–10.8)

## 2020-11-26 NOTE — Progress Notes (Signed)
Patient notified

## 2020-11-26 NOTE — Progress Notes (Signed)
Please let Thomas [name] know that overall white blood cell count has trended back down from 18.2 to now normal at 7.8.  Suspect at last labs may have had a mild infection present that appears to have resolved.  He is showing a little mild anemia these labs, would be good to recheck next visit to ensure no changes.  I do recommend he notify us if notices any rectal bleeding or nose bleeds, as may want to see sooner.  If any questions let me know.  Have a great day!!

## 2020-12-29 ENCOUNTER — Other Ambulatory Visit: Payer: Self-pay | Admitting: Family Medicine

## 2020-12-29 NOTE — Telephone Encounter (Signed)
Requested Prescriptions  Pending Prescriptions Disp Refills  . atorvastatin (LIPITOR) 10 MG tablet [Pharmacy Med Name: ATORVASTATIN 10 MG TABLET] 90 tablet 0    Sig: TAKE 1 TABLET BY MOUTH EVERY DAY     Cardiovascular:  Antilipid - Statins Failed - 12/29/2020 10:00 AM      Failed - LDL in normal range and within 360 days    LDL Chol Calc (NIH)  Date Value Ref Range Status  05/06/2020 62 0 - 99 mg/dL Final         Passed - Total Cholesterol in normal range and within 360 days    Cholesterol, Total  Date Value Ref Range Status  05/06/2020 119 100 - 199 mg/dL Final         Passed - HDL in normal range and within 360 days    HDL  Date Value Ref Range Status  05/06/2020 45 >39 mg/dL Final         Passed - Triglycerides in normal range and within 360 days    Triglycerides  Date Value Ref Range Status  05/06/2020 50 0 - 149 mg/dL Final         Passed - Patient is not pregnant      Passed - Valid encounter within last 12 months    Recent Outpatient Visits          1 month ago 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

## 2021-01-01 ENCOUNTER — Other Ambulatory Visit: Payer: Self-pay

## 2021-01-01 ENCOUNTER — Ambulatory Visit (INDEPENDENT_AMBULATORY_CARE_PROVIDER_SITE_OTHER): Payer: Medicare Other | Admitting: Nurse Practitioner

## 2021-01-01 ENCOUNTER — Encounter: Payer: Self-pay | Admitting: Nurse Practitioner

## 2021-01-01 VITALS — BP 152/74 | HR 69 | Temp 98.2°F | Wt 172.2 lb

## 2021-01-01 DIAGNOSIS — Z86718 Personal history of other venous thrombosis and embolism: Secondary | ICD-10-CM | POA: Diagnosis not present

## 2021-01-01 DIAGNOSIS — Z86711 Personal history of pulmonary embolism: Secondary | ICD-10-CM

## 2021-01-01 DIAGNOSIS — D6859 Other primary thrombophilia: Secondary | ICD-10-CM | POA: Diagnosis not present

## 2021-01-01 DIAGNOSIS — D649 Anemia, unspecified: Secondary | ICD-10-CM

## 2021-01-01 LAB — COAGUCHEK XS/INR WAIVED
INR: 2 — ABNORMAL HIGH (ref 0.9–1.1)
Prothrombin Time: 23.9 s

## 2021-01-01 NOTE — Progress Notes (Addendum)
BP (!) 152/74   Pulse 69   Temp 98.2 F (36.8 C) (Oral)   Wt 172 lb 3.2 oz (78.1 kg)   SpO2 98%   BMI 23.68 kg/m    Subjective:    Patient ID: Thomas [name], male    DOB: 14-Oct-1946, 75 y.o.   MRN: [id number]  HPI: This patient is a 75 y.o. male who presents for coumadin management. 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  His last labs on 11/25/20 noted INR 2.2.  WBC on repeat had returned to normal at 7.8.  CBC did show mild anemia with HGB of 12.2.  Patient denies any recent bleeding.  Denies night sweats, fever, fatigue, or weight loss.  Present Coumadin dose: 2.5 mg of Coumadin on Monday, Wednesday, Friday &  5 mg of Coumadin on Tuesday, Thursday, Saturday, Sunday Goal: 2.0-3.0 2.0-3.0 Excessive bruising: no Nose bleeding: no Rectal bleeding: no Prolonged menstrual cycles: N/A Eating diet with consistent amounts of foods containing Vitamin K:yes Any recent antibiotic use? no  Chief Complaint  Patient presents with  . DVT    1 month f/up    Relevant past medical, surgical, family and social history reviewed and updated as indicated. Interim medical history since our last visit reviewed. Allergies and medications reviewed and updated.  Review of Systems  Hematological: Negative.     Per HPI unless specifically indicated above     Objective:    BP (!) 152/74   Pulse 69   Temp 98.2 F (36.8 C) (Oral)   Wt 172 lb 3.2 oz (78.1 kg)   SpO2 98%   BMI 23.68 kg/m   Wt Readings from Last 3 Encounters:  01/01/21 172 lb 3.2 oz (78.1 kg)  11/25/20 173 lb 6 oz (78.6 kg)  10/28/20 172 lb (78 kg)    Physical Exam Vitals and nursing note reviewed.  Constitutional:      General: He is not in acute distress.    Appearance: Normal appearance. He is not ill-appearing, toxic-appearing or diaphoretic.  HENT:     Head: Normocephalic.     Right Ear:  External ear normal.     Left Ear: External ear normal.     Nose: Nose normal. No congestion or rhinorrhea.     Mouth/Throat:     Mouth: Mucous membranes are moist.  Eyes:     General:        Right eye: No discharge.        Left eye: No discharge.     Extraocular Movements: Extraocular movements intact.     Conjunctiva/sclera: Conjunctivae normal.     Pupils: Pupils are equal, round, and reactive to light.  Cardiovascular:     Rate and Rhythm: Normal rate and regular rhythm.     Heart sounds: No murmur heard.   Pulmonary:     Effort: Pulmonary effort is normal. No respiratory distress.     Breath sounds: Normal breath sounds. No wheezing, rhonchi or rales.  Abdominal:     General: Abdomen is flat. Bowel sounds are normal.  Musculoskeletal:     Cervical back: Normal range of motion and neck supple.  Skin:    General: Skin is warm and dry.     Capillary Refill: Capillary refill takes [name] than 2 seconds.  Neurological:     General: No focal deficit present.     Mental Status: He is  alert and oriented to person, place, and time.  Psychiatric:        Mood and Affect: Mood normal.        Behavior: Behavior normal.        Thought Content: Thought content normal.        Judgment: Judgment normal.     Results for orders placed or performed in visit on 01/01/21  CoaguChek XS/INR Waived  Result Value Ref Range   INR 2.0 (H) 0.9 - 1.1   Prothrombin Time 23.9 sec  CBC w/Diff  Result Value Ref Range   WBC 7.1 3.4 - 10.8 x10E3/uL   RBC 4.99 4.14 - 5.80 x10E6/uL   Hemoglobin 12.1 (L) 13.0 - 17.7 g/dL   Hematocrit 38.7 37.5 - 51.0 %   MCV 78 (L) 79 - 97 fL   MCH 24.2 (L) 26.6 - 33.0 pg   MCHC 31.3 (L) 31.5 - 35.7 g/dL   RDW 14.2 11.6 - 15.4 %   Platelets 213 150 - 450 x10E3/uL   Neutrophils 53 Not Estab. %   Lymphs 36 Not Estab. %   Monocytes 6 Not Estab. %   Eos 4 Not Estab. %   Basos 1 Not Estab. %   Neutrophils Absolute 3.7 1.4 - 7.0 x10E3/uL   Lymphocytes Absolute 2.6  0.7 - 3.1 x10E3/uL   Monocytes Absolute 0.4 0.1 - 0.9 x10E3/uL   EOS (ABSOLUTE) 0.3 0.0 - 0.4 x10E3/uL   Basophils Absolute 0.1 0.0 - 0.2 x10E3/uL   Immature Granulocytes 0 Not Estab. %   Immature Grans (Abs) 0.0 0.0 - 0.1 x10E3/uL      Assessment & Plan:  Discussed current plan face-to-face with patient. For coumadin dosing, elected to continue current dose. Will plan to recheck INR in 1 month.  Recheck CBC today due to anemia -- if ongoing elevation consider further work-up and hematology visit. 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  Goal INR 2.0-3.0.  INR today 2.0.  Continue current dosing-2.5 mg of Coumadin on Monday, Wednesday, Friday & 5 mg of Coumadin on Tuesday, Thursday, Saturday, Sunday.  Follow-up INR in 4 weeks.  Bleeding precautions discussed with patient at visit today.       Relevant Orders   CoaguChek XS/INR Waived (Completed)     Other   H/O deep venous thrombosis    Chronic, stable.  Goal INR 2.0-3.0.  INR today 2.0.  Continue current dosing-2.5 mg of Coumadin on Monday, Wednesday, Friday & 5 mg of Coumadin on Tuesday, Thursday, Saturday, Sunday.  Follow-up INR in 4 weeks.  Bleeding precautions discussed with patient at visit today.       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  Goal INR 2.0-3.0.  INR today 2.0.  Continue current dosing-2.5 mg of Coumadin on Monday, Wednesday, Friday & 5 mg of Coumadin on Tuesday, Thursday, Saturday, Sunday.  Follow-up INR in 4 weeks.  Bleeding precautions discussed at visit today.       Relevant Orders   CoaguChek XS/INR Waived (Completed)    Other Visit Diagnoses    Low hemoglobin       Rechecked at visit today.  If Hgb continues to be low, will likely refer to hematology for further evaluation and treatment.   Relevant Orders   CBC w/Diff (Completed)    Ambulatory  referral to Hematology       Follow up plan: Return in about 4 weeks (around 01/29/2021) for HTN,HLD and INR.

## 2021-01-01 NOTE — Assessment & Plan Note (Addendum)
Chronic, stable.  Goal INR 2.0-3.0.  INR today 2.0.  Continue current dosing-2.5 mg of Coumadin on Monday, Wednesday, Friday & 5 mg of Coumadin on Tuesday, Thursday, Saturday, Sunday.  Follow-up INR in 4 weeks.  Bleeding precautions discussed with patient at visit today.  

## 2021-01-01 NOTE — Progress Notes (Deleted)
BP (!) 152/74   Pulse 69   Temp 98.2 F (36.8 C) (Oral)   Wt 172 lb 3.2 oz (78.1 kg)   SpO2 98%   BMI 23.68 kg/m    Subjective:    Patient ID: Thomas [name], male    DOB: 1946/10/20, 75 y.o.   MRN: [id number]  CC: Coumadin management  HPI:   Relevant past medical, surgical, family and social history reviewed and updated as indicated. Interim medical history since our last visit reviewed. Allergies and medications reviewed and updated.  ROS: Per HPI unless specifically indicated above     Objective:    BP (!) 152/74   Pulse 69   Temp 98.2 F (36.8 C) (Oral)   Wt 172 lb 3.2 oz (78.1 kg)   SpO2 98%   BMI 23.68 kg/m   Wt Readings from Last 3 Encounters:  01/01/21 172 lb 3.2 oz (78.1 kg)  11/25/20 173 lb 6 oz (78.6 kg)  10/28/20 172 lb (78 kg)     General: Well appearing, well nourished in no distress.  Normal mood and affect.  No lymphadenopathy noted.  Clear lung sounds throughout. Skin: No excessive bruising or rash  Last INR: 2.2    Last CBC:  Lab Results  Component Value Date   WBC 7.8 11/25/2020   HGB 12.2 (L) 11/25/2020   HCT 38.7 11/25/2020   MCV 79 11/25/2020   PLT 201 11/25/2020    Results for orders placed or performed in visit on 11/25/20  CoaguChek XS/INR Waived  Result Value Ref Range   INR 2.2 (H) 0.9 - 1.1   Prothrombin Time 26.5 sec  CBC with Differential/Platelet  Result Value Ref Range   WBC 7.8 3.4 - 10.8 x10E3/uL   RBC 4.90 4.14 - 5.80 x10E6/uL   Hemoglobin 12.2 (L) 13.0 - 17.7 g/dL   Hematocrit 38.7 37.5 - 51.0 %   MCV 79 79 - 97 fL   MCH 24.9 (L) 26.6 - 33.0 pg   MCHC 31.5 31.5 - 35.7 g/dL   RDW 13.8 11.6 - 15.4 %   Platelets 201 150 - 450 x10E3/uL   Neutrophils 63 Not Estab. %   Lymphs 25 Not Estab. %   Monocytes 7 Not Estab. %   Eos 4 Not Estab. %   Basos 1 Not Estab. %   Neutrophils Absolute 4.9 1.4 - 7.0 x10E3/uL   Lymphocytes Absolute 2.0 0.7 - 3.1 x10E3/uL   Monocytes Absolute 0.6 0.1 - 0.9 x10E3/uL   EOS  (ABSOLUTE) 0.3 0.0 - 0.4 x10E3/uL   Basophils Absolute 0.1 0.0 - 0.2 x10E3/uL   Immature Granulocytes 0 Not Estab. %   Immature Grans (Abs) 0.0 0.0 - 0.1 x10E3/uL     Physical Exam Vitals reviewed.  Constitutional:      Appearance: Normal appearance. He is normal weight.  HENT:     Head: Normocephalic.     Right Ear: External ear normal.     Left Ear: External ear normal.     Nose: Nose normal.     Mouth/Throat:     Mouth: Mucous membranes are moist.  Eyes:     General:        Right eye: No discharge.        Left eye: No discharge.     Extraocular Movements: Extraocular movements intact.     Pupils: Pupils are equal, round, and reactive to light.  Cardiovascular:     Rate and Rhythm: Normal rate and regular rhythm.  Pulmonary:  Effort: Pulmonary effort is normal.     Breath sounds: Normal breath sounds.  Musculoskeletal:     Cervical back: Normal range of motion and neck supple.  Skin:    General: Skin is warm and dry.     Capillary Refill: Capillary refill takes [name] than 2 seconds.  Neurological:     General: No focal deficit present.     Mental Status: He is alert and oriented to person, place, and time.  Psychiatric:        Mood and Affect: Mood normal.        Behavior: Behavior normal.        Thought Content: Thought content normal.        Judgment: Judgment normal.      Assessment:     ICD-10-CM   1. Antithrombin 3 deficiency (HCC)  D68.59 CoaguChek XS/INR Waived  2. 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 H/O deep venous thrombosis  Z86.718 CoaguChek XS/INR Waived  4. Low hemoglobin  D64.9 CBC w/Diff   Rechecked at visit today.  If Hgb continues to be low, will likely refer to hematology for further evaluation and treatment.    Plan:   Discussed current plan face-to-face with patient. For coumadin dosing, elected to continue current dose. Will plan to recheck INR in 1 month.  Recheck CBC today due to anemia -- if ongoing  elevation consider further work-up and hematology visit.

## 2021-01-01 NOTE — Patient Instructions (Signed)
Bleeding Precautions When on Anticoagulant Therapy, Adult Anticoagulant therapy, also called blood thinner therapy, is medicine that helps to prevent and treat blood clots. The medicine works by stopping blood clots from forming or growing. Blood clots that form in your blood vessels can be dangerous. They can break loose and travel to the heart, lungs, or brain. This increases the risk of a heart attack, stroke, or blocked lung artery (pulmonary embolism). Anticoagulants also increase the risk of bleeding. Try to protect yourself from cuts and other injuries that can cause bleeding. It is important to take anticoagulants exactly as told by your health care provider. Why do I need to be on anticoagulant therapy? You may need this medicine if you are at risk of developing a blood clot. Conditions that increase your risk of a blood clot include:  Being born with heart disease or a heart malformation (congenital heart disease).  Developing heart disease.  Having had surgery, such as valve replacement.  Having had a serious accident or other type of severe injury (trauma).  Having certain types of cancer.  Having certain diseases that can increase blood clotting.  Having a high risk of stroke or heart attack.  Having atrial fibrillation (AF). What are the common anticoagulant medicines? There are several types of anticoagulant medicines. The most common types are:  Medicines that you take by mouth (oral medicines), such as: ? Warfarin. ? Novel oral anticoagulants (NOACs), such as:  Direct thrombin inhibitors (dabigatran).  Factor Xa inhibitors (apixaban, edoxaban, and rivaroxaban).  Injections, such as: ? Unfractionated heparin. ? Low molecular weight heparin. These anticoagulants work in different ways to prevent blood clots. They also have different risks and side effects. What do I need to remember while on anticoagulant therapy? Taking anticoagulants  Take your medicine at the  same time every day. If you forget to take your medicine, take it as soon as you remember. Do not double your dosage of medicine if you miss a whole day. Take your normal dose and call your health care provider.  Do not stop taking your medicine unless your health care provider approves. Stopping the medicine can increase your risk of developing a blood clot. Taking other medicines  Take over-the-counter and prescriptions medicines only as told by your health care provider.  Do not take over-the-counter NSAIDs, including aspirin and ibuprofen, while you are on anticoagulant therapy. These medicines increase your risk of dangerous bleeding.  Get approval from your health care provider before you start taking any new medicines, vitamins, or herbal products. Some of these could interfere with your therapy. General instructions  Keep all follow-up visits as told by your health care provider. This is important.  If you are pregnant or trying to get pregnant, talk with a health care provider about anticoagulants. Some of these medicines are not safe to take during pregnancy.  Tell all health care providers, including your dentist, that you are on anticoagulant therapy. It is especially important to tell providers before you have any surgery, medical procedures, or dental work done. What precautions should I take?  Be very careful when using knives, scissors, or other sharp objects.  Use an electric razor instead of a blade.  Do not use toothpicks.  Use a soft-bristled toothbrush. Brush your teeth gently.  Always wear shoes outdoors and wear slippers indoors.  Be careful when cutting your fingernails and toenails.  Place bath mats in the bathroom. If possible, install handrails as well.  Wear gloves while you do yard   work.  Wear your seat belt.  Prevent falls by removing loose rugs and extension cords from areas where you walk. Use a cane or walker if you need it.  Avoid constipation  by: ? Drinking enough fluid to keep your urine clear or pale yellow. ? Eating foods that are high in fiber, such as fresh fruits and vegetables, whole grains, and beans. ? Limiting foods that are high in fat and processed sugars, such as fried and sweet foods.  Do not play contact sports or participate in other activities that have a high risk for injury.   What other precautions are important if on warfarin therapy? If you are taking a type of anticoagulant called warfarin, make sure you:  Work with a diet and nutrition specialist (dietitian) to make an eating plan. Do not make any sudden changes to your diet after you have started your eating plan.  Do not drink alcohol. It can interfere with your medicine and increase your risk of an injury that causes bleeding.  Get regular blood tests as told by your health care provider.   What are some questions to ask my health care provider?  Why do I need anticoagulant therapy?  What is the best anticoagulant therapy for my condition?  How long will I need anticoagulant therapy?  What are the side effects of anticoagulant therapy?  When should I take my medicine? What should I do if I forget to take it?  Will I need to have regular blood tests?  Do I need to change my diet? Are there foods or drinks that I should avoid?  What activities are safe for me?  What should I do if I want to get pregnant? Contact a health care provider if:  You miss a dose of medicine: ? And you are not sure what to do. ? For more than one day.  You have: ? Menstrual bleeding that is heavier than normal. ? Bloody or brown urine. ? Easy bruising. ? Black and tarry stool or bright red stool. ? Side effects from your medicine.  You feel weak or dizzy.  You become pregnant. Get help right away if:  You have bleeding that will not stop within 20 minutes from: ? The nose. ? The gums. ? A cut on the skin.  You have a severe headache or  stomachache.  You vomit or cough up blood.  You fall or hit your head. Summary  Anticoagulant therapy, also called blood thinner therapy, is medicine that helps to prevent and treat blood clots.  Anticoagulants work in different ways to prevent blood clots. They also have different risks and side effects.  Talk with your health care provider about any precautions that you should take while on anticoagulant therapy. This information is not intended to replace advice given to you by your health care provider. Make sure you discuss any questions you have with your health care provider. Document Revised: 04/22/2020 Document Reviewed: 04/22/2020 Elsevier Patient Education  2021 [employer] Inc.  

## 2021-01-01 NOTE — Assessment & Plan Note (Addendum)
Chronic, stable.  Goal INR 2.0-3.0.  INR today 2.0.  Continue current dosing-2.5 mg of Coumadin on Monday, Wednesday, Friday & 5 mg of Coumadin on Tuesday, Thursday, Saturday, Sunday.  Follow-up INR in 4 weeks.  Bleeding precautions discussed with patient at visit today.

## 2021-01-01 NOTE — Assessment & Plan Note (Addendum)
Chronic, stable.  Goal INR 2.0-3.0.  INR today 2.0.  Continue current dosing-2.5 mg of Coumadin on Monday, Wednesday, Friday & 5 mg of Coumadin on Tuesday, Thursday, Saturday, Sunday.  Follow-up INR in 4 weeks.  Bleeding precautions discussed at visit today.

## 2021-01-02 LAB — CBC WITH DIFFERENTIAL/PLATELET
Basophils Absolute: 0.1 10*3/uL (ref 0.0–0.2)
Basos: 1 %
EOS (ABSOLUTE): 0.3 10*3/uL (ref 0.0–0.4)
Eos: 4 %
Hematocrit: 38.7 % (ref 37.5–51.0)
Hemoglobin: 12.1 g/dL — ABNORMAL LOW (ref 13.0–17.7)
Immature Grans (Abs): 0 10*3/uL (ref 0.0–0.1)
Immature Granulocytes: 0 %
Lymphocytes Absolute: 2.6 10*3/uL (ref 0.7–3.1)
Lymphs: 36 %
MCH: 24.2 pg — ABNORMAL LOW (ref 26.6–33.0)
MCHC: 31.3 g/dL — ABNORMAL LOW (ref 31.5–35.7)
MCV: 78 fL — ABNORMAL LOW (ref 79–97)
Monocytes Absolute: 0.4 10*3/uL (ref 0.1–0.9)
Monocytes: 6 %
Neutrophils Absolute: 3.7 10*3/uL (ref 1.4–7.0)
Neutrophils: 53 %
Platelets: 213 10*3/uL (ref 150–450)
RBC: 4.99 x10E6/uL (ref 4.14–5.80)
RDW: 14.2 % (ref 11.6–15.4)
WBC: 7.1 10*3/uL (ref 3.4–10.8)

## 2021-01-02 NOTE — Addendum Note (Signed)
Addended by: Jon Billings on: 01/02/2021 10:21 AM   Modules accepted: Orders

## 2021-01-27 ENCOUNTER — Other Ambulatory Visit: Payer: Self-pay | Admitting: Nurse Practitioner

## 2021-01-27 DIAGNOSIS — Z86711 Personal history of pulmonary embolism: Secondary | ICD-10-CM

## 2021-01-27 NOTE — Telephone Encounter (Signed)
Requested medication (s) are due for refill today:yes  Requested medication (s) are on the active medication list: yes  Last refill: 10/28/2020  Future visit scheduled: yes  Notes to clinic: this refill cannot be delegated   Requested Prescriptions  Pending Prescriptions Disp Refills   warfarin (COUMADIN) 5 MG tablet [Pharmacy Med Name: WARFARIN SODIUM 5 MG TABLET] 90 tablet 1    Sig: TAKE 1 TABLET BY [place]      Hematology:  Anticoagulants - warfarin Failed - 01/27/2021  1:28 AM      Failed - This refill cannot be delegated      Failed - If the patient is managed by Coumadin Clinic - route to their Pool. If not, forward to the provider.      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

## 2021-01-28 DIAGNOSIS — Z7901 Long term (current) use of anticoagulants: Secondary | ICD-10-CM | POA: Insufficient documentation

## 2021-01-28 NOTE — Progress Notes (Signed)
BP 128/69   Pulse 88   Temp 98.3 F (36.8 C)   Wt 170 lb 8 oz (77.3 kg)   SpO2 94%   BMI 23.45 kg/m    Subjective:    Patient ID: Thomas [name], male    DOB: [birth year], 75 y.o.   MRN: [id number]  Chief Complaint  Patient presents with  . Hypertension   HPI: This patient is a 75 y.o. male who presents for coumadin management. 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  His last labs on 11/25/20 noted INR 2.0.  WBC on repeat had returned to normal at 7.8.  CBC did show mild anemia with HGB of 12.2.  Patient denies any recent bleeding.  Denies night sweats, fever, fatigue, or weight loss.  COUMADIN CHECK Present Coumadin dose: 2.5 mg of Coumadin on Monday, Wednesday, Friday &  5 mg of Coumadin on Tuesday, Thursday, Saturday, Sunday Goal: 2.0-3.0 2.0-3.0 Excessive bruising: no Nose bleeding: no Rectal bleeding: no Prolonged menstrual cycles: N/A Eating diet with consistent amounts of foods containing Vitamin K:yes Any recent antibiotic use? no  HYPERTENSION / HYPERLIPIDEMIA Satisfied with current treatment? yes Duration of hypertension: years BP monitoring frequency: not checking BP range:  BP medication side effects: no Past BP meds: amlodipine Duration of hyperlipidemia: years Cholesterol medication side effects: no Cholesterol supplements: none Past cholesterol medications: atorvastain (lipitor) Medication compliance: excellent compliance Aspirin: no Recent stressors: no Recurrent headaches: no Visual changes: no Palpitations: no Dyspnea: no Chest pain: no Lower extremity edema: no Dizzy/lightheaded: no  ANEMIA Anemia status: stable Etiology of anemia: Duration of anemia treatment:  Compliance with treatment: No medications Iron supplementation side effects: no Severity of anemia: mild Fatigue: no Decreased exercise tolerance: no   Dyspnea on exertion: no Palpitations: no Bleeding: no Pica: no  Relevant past medical, surgical, family and social history reviewed and updated as indicated. Interim medical history since our last visit reviewed. Allergies and medications reviewed and updated.  Review of Systems  HENT:       Cut on ear.  Eyes: Negative for visual disturbance.  Respiratory: Negative for shortness of breath.   Cardiovascular: Negative for chest pain and leg swelling.  Neurological: Negative for light-headedness and headaches.  Hematological: Negative.     Per HPI unless specifically indicated above     Objective:    BP 128/69   Pulse 88   Temp 98.3 F (36.8 C)   Wt 170 lb 8 oz (77.3 kg)   SpO2 94%   BMI 23.45 kg/m   Wt Readings from Last 3 Encounters:  01/29/21 170 lb 8 oz (77.3 kg)  01/01/21 172 lb 3.2 oz (78.1 kg)  11/25/20 173 lb 6 oz (78.6 kg)    Physical Exam Vitals and nursing note reviewed.  Constitutional:      General: He is not in acute distress.    Appearance: Normal appearance. He is not ill-appearing, toxic-appearing or diaphoretic.  HENT:     Head: Normocephalic.     Comments: Healing cut on left ear.    Right Ear: External ear normal.     Left Ear: External ear normal.     Nose: Nose normal. No congestion or rhinorrhea.     Mouth/Throat:     Mouth: Mucous membranes are moist.  Eyes:     General:        Right eye: No discharge.  Left eye: No discharge.     Extraocular Movements: Extraocular movements intact.     Conjunctiva/sclera: Conjunctivae normal.     Pupils: Pupils are equal, round, and reactive to light.  Cardiovascular:     Rate and Rhythm: Normal rate and regular rhythm.     Heart sounds: No murmur heard.   Pulmonary:     Effort: Pulmonary effort is normal. No respiratory distress.     Breath sounds: Normal breath sounds. No wheezing, rhonchi or rales.  Abdominal:     General: Abdomen is flat. Bowel sounds are normal.  Musculoskeletal:      Cervical back: Normal range of motion and neck supple.  Skin:    General: Skin is warm and dry.     Capillary Refill: Capillary refill takes [name] than 2 seconds.  Neurological:     General: No focal deficit present.     Mental Status: He is alert and oriented to person, place, and time.  Psychiatric:        Mood and Affect: Mood normal.        Behavior: Behavior normal.        Thought Content: Thought content normal.        Judgment: Judgment normal.     Results for orders placed or performed in visit on 01/01/21  CoaguChek XS/INR Waived  Result Value Ref Range   INR 2.0 (H) 0.9 - 1.1   Prothrombin Time 23.9 sec  CBC w/Diff  Result Value Ref Range   WBC 7.1 3.4 - 10.8 x10E3/uL   RBC 4.99 4.14 - 5.80 x10E6/uL   Hemoglobin 12.1 (L) 13.0 - 17.7 g/dL   Hematocrit 38.7 37.5 - 51.0 %   MCV 78 (L) 79 - 97 fL   MCH 24.2 (L) 26.6 - 33.0 pg   MCHC 31.3 (L) 31.5 - 35.7 g/dL   RDW 14.2 11.6 - 15.4 %   Platelets 213 150 - 450 x10E3/uL   Neutrophils 53 Not Estab. %   Lymphs 36 Not Estab. %   Monocytes 6 Not Estab. %   Eos 4 Not Estab. %   Basos 1 Not Estab. %   Neutrophils Absolute 3.7 1.4 - 7.0 x10E3/uL   Lymphocytes Absolute 2.6 0.7 - 3.1 x10E3/uL   Monocytes Absolute 0.4 0.1 - 0.9 x10E3/uL   EOS (ABSOLUTE) 0.3 0.0 - 0.4 x10E3/uL   Basophils Absolute 0.1 0.0 - 0.2 x10E3/uL   Immature Granulocytes 0 Not Estab. %   Immature Grans (Abs) 0.0 0.0 - 0.1 x10E3/uL      Assessment & Plan:  Discussed current plan face-to-face with patient. For coumadin dosing, elected to continue current dose. Will plan to recheck INR in 1 month.  Recheck CBC today due to anemia -- if ongoing elevation consider further work-up and hematology visit. Problem List Items Addressed This Visit      Cardiovascular and Mediastinum   Essential hypertension    Chronic, stable.  Will continue amlodipine 2.5 mg daily.  Lab checked today.  Follow-up in 6 months or sooner with any shortness of breath, chest pain,  vision changes, or ongoing headaches.      Relevant Orders   Comprehensive metabolic panel     Endocrine   Impaired fasting glucose    Chronic, ongoing.  A1c today 5.5%.  We will continue to follow every 6 months.      Relevant Orders   Comprehensive metabolic panel   Bayer DCA Hb A1c Waived   Lipid Profile  Hematopoietic and Hemostatic   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  Goal INR 2.0-3.0.  INR today 2.9.  Continue current dosing-2.5 mg of Coumadin on Monday, Wednesday, Friday & 5 mg of Coumadin on Tuesday, Thursday, Saturday, Sunday.  Follow-up INR in 4 weeks.  Bleeding precautions discussed with patient at visit today.         Other   Hyperlipidemia    Recheck lipids, adjust as needed. Continue current regimen. Refill sent today.      Relevant Orders   Lipid Profile   Encounter for current long-term use of anticoagulants    Chronic, stable.  Goal INR 2.0-3.0.  INR today 2.9.  Continue current dosing-2.5 mg of Coumadin on Monday, Wednesday, Friday & 5 mg of Coumadin on Tuesday, Thursday, Saturday, Sunday.  Follow-up INR in 4 weeks.  Bleeding precautions discussed with patient at visit today.       Relevant Orders   CoaguChek XS/INR Waived   CBC w/Diff    Other Visit Diagnoses    Cut of face       Healing.  Will give Tetanus.   Relevant Orders   Td vaccine greater than or equal to 7yo preservative free IM (Completed)       Follow up plan: Return in about 4 weeks (around 02/26/2021) for Coag check .

## 2021-01-29 ENCOUNTER — Other Ambulatory Visit: Payer: Self-pay

## 2021-01-29 ENCOUNTER — Encounter: Payer: Self-pay | Admitting: Nurse Practitioner

## 2021-01-29 ENCOUNTER — Ambulatory Visit (INDEPENDENT_AMBULATORY_CARE_PROVIDER_SITE_OTHER): Payer: Medicare Other | Admitting: Nurse Practitioner

## 2021-01-29 VITALS — BP 128/69 | HR 88 | Temp 98.3°F | Wt 170.5 lb

## 2021-01-29 DIAGNOSIS — S0181XA Laceration without foreign body of other part of head, initial encounter: Secondary | ICD-10-CM

## 2021-01-29 DIAGNOSIS — R7301 Impaired fasting glucose: Secondary | ICD-10-CM

## 2021-01-29 DIAGNOSIS — E785 Hyperlipidemia, unspecified: Secondary | ICD-10-CM | POA: Diagnosis not present

## 2021-01-29 DIAGNOSIS — D6859 Other primary thrombophilia: Secondary | ICD-10-CM

## 2021-01-29 DIAGNOSIS — I10 Essential (primary) hypertension: Secondary | ICD-10-CM | POA: Diagnosis not present

## 2021-01-29 DIAGNOSIS — Z7901 Long term (current) use of anticoagulants: Secondary | ICD-10-CM

## 2021-01-29 DIAGNOSIS — Z23 Encounter for immunization: Secondary | ICD-10-CM

## 2021-01-29 LAB — BAYER DCA HB A1C WAIVED: HB A1C (BAYER DCA - WAIVED): 5.3 % (ref <7.0)

## 2021-01-29 LAB — COAGUCHEK XS/INR WAIVED
INR: 2.9 — ABNORMAL HIGH (ref 0.9–1.1)
Prothrombin Time: 34.5 s

## 2021-01-29 NOTE — Assessment & Plan Note (Signed)
Chronic, stable.  Will continue amlodipine 2.5 mg daily.  Lab checked today.  Follow-up in 6 months or sooner with any shortness of breath, chest pain, vision changes, or ongoing headaches.

## 2021-01-29 NOTE — Assessment & Plan Note (Signed)
Chronic, ongoing.  A1c today 5.5%.  We will continue to follow every 6 months.

## 2021-01-29 NOTE — Assessment & Plan Note (Addendum)
Chronic, stable.  Goal INR 2.0-3.0.  INR today 2.9.  Continue current dosing-2.5 mg of Coumadin on Monday, Wednesday, Friday & 5 mg of Coumadin on Tuesday, Thursday, Saturday, Sunday.  Follow-up INR in 4 weeks.  Bleeding precautions discussed with patient at visit today.  

## 2021-01-29 NOTE — Assessment & Plan Note (Signed)
Recheck lipids, adjust as needed. Continue current regimen. Refill sent today.

## 2021-01-30 ENCOUNTER — Telehealth: Payer: Self-pay

## 2021-01-30 ENCOUNTER — Ambulatory Visit: Payer: Medicare Other

## 2021-01-30 LAB — CBC WITH DIFFERENTIAL/PLATELET
Basophils Absolute: 0.1 10*3/uL (ref 0.0–0.2)
Basos: 0 %
EOS (ABSOLUTE): 0.1 10*3/uL (ref 0.0–0.4)
Eos: 1 %
Hematocrit: 40.8 % (ref 37.5–51.0)
Hemoglobin: 12.7 g/dL — ABNORMAL LOW (ref 13.0–17.7)
Immature Grans (Abs): 0.1 10*3/uL (ref 0.0–0.1)
Immature Granulocytes: 1 %
Lymphocytes Absolute: 1.6 10*3/uL (ref 0.7–3.1)
Lymphs: 10 %
MCH: 24.5 pg — ABNORMAL LOW (ref 26.6–33.0)
MCHC: 31.1 g/dL — ABNORMAL LOW (ref 31.5–35.7)
MCV: 79 fL (ref 79–97)
Monocytes Absolute: 0.7 10*3/uL (ref 0.1–0.9)
Monocytes: 4 %
Neutrophils Absolute: 13.9 10*3/uL — ABNORMAL HIGH (ref 1.4–7.0)
Neutrophils: 84 %
Platelets: 227 10*3/uL (ref 150–450)
RBC: 5.19 x10E6/uL (ref 4.14–5.80)
RDW: 14.2 % (ref 11.6–15.4)
WBC: 16.3 10*3/uL — ABNORMAL HIGH (ref 3.4–10.8)

## 2021-01-30 LAB — COMPREHENSIVE METABOLIC PANEL
ALT: 8 IU/L (ref 0–44)
AST: 16 IU/L (ref 0–40)
Albumin/Globulin Ratio: 1.5 (ref 1.2–2.2)
Albumin: 4.5 g/dL (ref 3.7–4.7)
Alkaline Phosphatase: 67 IU/L (ref 44–121)
BUN/Creatinine Ratio: 12 (ref 10–24)
BUN: 13 mg/dL (ref 8–27)
Bilirubin Total: 0.4 mg/dL (ref 0.0–1.2)
CO2: 20 mmol/L (ref 20–29)
Calcium: 9.7 mg/dL (ref 8.6–10.2)
Chloride: 104 mmol/L (ref 96–106)
Creatinine, Ser: 1.12 mg/dL (ref 0.76–1.27)
Globulin, Total: 3 g/dL (ref 1.5–4.5)
Glucose: 109 mg/dL — ABNORMAL HIGH (ref 65–99)
Potassium: 4.4 mmol/L (ref 3.5–5.2)
Sodium: 143 mmol/L (ref 134–144)
Total Protein: 7.5 g/dL (ref 6.0–8.5)
eGFR: 69 mL/min/{1.73_m2} (ref >59)

## 2021-01-30 LAB — LIPID PANEL
Chol/HDL Ratio: 2.9 ratio (ref 0.0–5.0)
Cholesterol, Total: 120 mg/dL (ref 100–199)
HDL: 41 mg/dL (ref >39)
LDL Chol Calc (NIH): 65 mg/dL (ref 0–99)
Triglycerides: 68 mg/dL (ref 0–149)
VLDL Cholesterol Cal: 14 mg/dL (ref 5–40)

## 2021-01-30 NOTE — Telephone Encounter (Signed)
-----   Message from Jon Billings, NP sent at 01/30/2021  8:11 AM EST ----- Please let patient know his liver, kidneys and electrolytes look good.  Follow up as discussed in the visit.

## 2021-01-30 NOTE — Progress Notes (Signed)
Please let patient know that his blood counts are stable.  His white blood cell count is elevated but this is likely due to his cough and congestion he is experiencing.  We will repeat it at our next visit.  Follow up in 1 month.

## 2021-01-31 ENCOUNTER — Ambulatory Visit (INDEPENDENT_AMBULATORY_CARE_PROVIDER_SITE_OTHER): Payer: Medicare Other

## 2021-01-31 VITALS — Ht 71.5 in | Wt 170.0 lb

## 2021-01-31 DIAGNOSIS — Z0000 Encounter for general adult medical examination without abnormal findings: Secondary | ICD-10-CM | POA: Diagnosis not present

## 2021-01-31 NOTE — Progress Notes (Signed)
I connected with Thomas [name] today by telephone and verified that I am speaking with the correct person using two identifiers. Location patient: home Location provider: work Persons participating in the virtual visit: Thomas [name], Glenna Durand LPN.   I discussed the limitations, risks, security and privacy concerns of performing an evaluation and management service by telephone and the availability of in person appointments. I also discussed with the patient that there may be a patient responsible charge related to this service. The patient expressed understanding and verbally consented to this telephonic visit.    Interactive audio and video telecommunications were attempted between this provider and patient, however failed, due to patient having technical difficulties OR patient did not have access to video capability.  We continued and completed visit with audio only.     Vital signs may be patient reported or missing.  Subjective:   Thomas [name] is a 75 y.o. male who presents for Medicare Annual/Subsequent preventive examination.  Review of Systems     Cardiac Risk Factors include: advanced age (>84men, >54 women);dyslipidemia;hypertension;male gender     Objective:    Today's Vitals   01/31/21 1342  Weight: 170 lb (77.1 kg)  Height: 5' 11.5" (1.816 m)   Body mass index is 23.38 kg/m.  Advanced Directives 01/31/2021 01/08/2020  Does Patient Have a Medical Advance Directive? No No  Would patient like information on creating a medical advance directive? - Yes (MAU/Ambulatory/Procedural Areas - Information given)    Current Medications (verified) Outpatient Encounter Medications as of 01/31/2021  Medication Sig  . amLODipine (NORVASC) 2.5 MG tablet TAKE 1 TABLET BY MOUTH EVERY DAY  . atorvastatin (LIPITOR) 10 MG tablet TAKE 1 TABLET BY MOUTH EVERY DAY  . diphenhydrAMINE-PE-APAP (EQ SEVERE ALLERGY & SINUS) 25-5-325 MG TABS Take 1 tablet by mouth as needed.  .  montelukast (SINGULAIR) 10 MG tablet TAKE 1 TABLET BY MOUTH EVERYDAY AT BEDTIME  . Pseudoeph-Doxylamine-DM-APAP (DAYQUIL/NYQUIL COLD/FLU RELIEF PO) Take by mouth as needed.  . warfarin (COUMADIN) 5 MG tablet TAKE 1 TABLET BY MOUTH EVERY DAY   No facility-administered encounter medications on file as of 01/31/2021.    Allergies (verified) Dexamethasone   History: Past Medical History:  Diagnosis Date  . Clotting disorder ([place])   . Hyperlipidemia   . Hypertension   . Sinus infection   . Tinnitus    Past Surgical History:  Procedure Laterality Date  . CATARACT EXTRACTION Bilateral 1998, 2007  . MOHS SURGERY Left 09/08/2018   Mohs surgery Ear Dr.  Leory Plowman [facility], Dr. Lynnell Jude  . TONSILLECTOMY  1956   Family History  Problem Relation Age of Onset  . Alzheimer's disease Mother   . Heart attack Mother   . Heart attack Father   . Cancer Sister   . Diabetes Sister   . Diabetes Sister    Social History   Socioeconomic History  . Marital status: Single    Spouse name: Not on file  . Number of children: 0  . Years of education: Not on file  . Highest education level: Not on file  Occupational History  . Occupation: Retried  Tobacco Use  . Smoking status: Former Smoker    Packs/day: 1.00    Quit date: 2002    Years since quitting: 20.1  . Smokeless tobacco: Never Used  . Tobacco comment: started smokng at age 14  Vaping Use  . Vaping Use: Never used  Substance and Sexual Activity  . Alcohol use: Not Currently  Alcohol/week: 0.0 standard drinks    Comment: rare  . Drug use: No  . Sexual activity: Not on file  Other Topics Concern  . Not on file  Social History Narrative  . Not on file   Social Determinants of Health   Financial Resource Strain: Low Risk   . Difficulty of Paying Living Expenses: Not hard at all  Food Insecurity: No Food Insecurity  . Worried About Charity fundraiser in the Last Year: Never true  . Ran Out of Food in the Last Year: Never  true  Transportation Needs: No Transportation Needs  . Lack of Transportation (Medical): No  . Lack of Transportation (Non-Medical): No  Physical Activity: Inactive  . Days of Exercise per Week: 0 days  . Minutes of Exercise per Session: 0 min  Stress: No Stress Concern Present  . Feeling of Stress : Not at all  Social Connections: Not on file    Tobacco Counseling Counseling given: Not Answered Comment: started smokng at age 25   Clinical Intake:  Pre-visit preparation completed: Yes  Pain : No/denies pain     Nutritional Status: BMI of 19-24  Normal Nutritional Risks: None Diabetes: No  How often do you need to have someone help you when you read instructions, pamphlets, or other written materials from your doctor or pharmacy?: 1 - Never What is the last grade level you completed in school?: 9th grade  Diabetic? no  Interpreter Needed?: No  Information entered by :: NAllen LPN   Activities of Daily Living In your present state of health, do you have any difficulty performing the following activities: 01/31/2021 01/29/2021  Hearing? Y Y  Comment partial hearing in left ear -  Vision? N N  Difficulty concentrating or making decisions? N N  Walking or climbing stairs? N N  Dressing or bathing? N N  Doing errands, shopping? N N  Preparing Food and eating ? N -  Using the Toilet? N -  In the past six months, have you accidently leaked urine? N -  Do you have problems with loss of bowel control? N -  Managing your Medications? N -  Managing your Finances? N -  Housekeeping or managing your Housekeeping? N -  Some recent data might be hidden    Patient Care Team: Jon Billings, NP as PCP - General Marcial Pacas, MD as Referring Physician (Plastic Surgery) Merril Abbe, MD as Referring Physician (Dermatology)  Indicate any recent Medical Services you may have received from other than [facility] providers in the past year (date may be approximate).      Assessment:   This is a routine wellness examination for Thomas [name].  Hearing/Vision screen  Hearing Screening   125Hz  250Hz  500Hz  1000Hz  2000Hz  3000Hz  4000Hz  6000Hz  8000Hz   Right ear:           Left ear:           Vision Screening Comments: No regular eye exams,  Dietary issues and exercise activities discussed: Current Exercise Habits: The patient does not participate in regular exercise at present  Goals    . Patient Stated     01/31/2021, no goals      Depression Screen PHQ 2/9 Scores 01/31/2021 01/29/2021 01/08/2020 11/08/2019 02/15/2017  PHQ - 2 Score 0 2 0 0 0  PHQ- 9 Score - 5 - 0 0    Fall Risk Fall Risk  01/31/2021 01/29/2021 01/08/2020 11/08/2019 04/15/2018  Falls in the past year? 1 1 0 0  No  Comment tripped over a vine - - - -  Number falls in past yr: 0 0 0 0 -  Injury with Fall? 0 0 0 0 -  Risk for fall due to : Medication side effect - - - -  Follow up Falls evaluation completed;Education provided;Falls prevention discussed - - - -    FALL RISK PREVENTION PERTAINING TO THE HOME:  Any stairs in or around the home? Yes  If so, are there any without handrails? No  Home free of loose throw rugs in walkways, pet beds, electrical cords, etc? Yes  Adequate lighting in your home to reduce risk of falls? Yes   ASSISTIVE DEVICES UTILIZED TO PREVENT FALLS:  Life alert? No  Use of a cane, walker or w/c? No  Grab bars in the bathroom? No  Shower chair or bench in shower? No  Elevated toilet seat or a handicapped toilet? No   TIMED UP AND GO:  Was the test performed? No .     Cognitive Function:     6CIT Screen 01/31/2021  What Year? 0 points  What month? 0 points  What time? 0 points  Count back from 20 0 points  Months in reverse 2 points  Repeat phrase 4 points  Total Score 6    Immunizations Immunization History  Administered Date(s) Administered  . Fluad Quad(high Dose 65+) 11/08/2019, 08/08/2020  . Moderna Sars-Covid-2 Vaccination 05/07/2020, 06/04/2020  .  PFIZER Comirnaty(Gray Top)Covid-19 Tri-Sucrose Vaccine 01/01/2021  . Pneumococcal Conjugate-13 01/07/2016  . Pneumococcal Polysaccharide-23 12/07/2011  . Td 01/29/2021    TDAP status: Up to date  Flu Vaccine status: Up to date  Pneumococcal vaccine status: Up to date  Covid-19 vaccine status: Completed vaccines  Qualifies for Shingles Vaccine? Yes   Zostavax completed No   Shingrix Completed?: No.    Education has been provided regarding the importance of this vaccine. Patient has been advised to call insurance company to determine out of pocket expense if they have not yet received this vaccine. Advised may also receive vaccine at local pharmacy or Health Dept. Verbalized acceptance and understanding.  Screening Tests Health Maintenance  Topic Date Due  . Hepatitis C Screening  Never done  . COLONOSCOPY (Pts 45-54yrs Insurance coverage will need to be confirmed)  01/31/2021 (Originally 05/24/1991)  . COVID-19 Vaccine (4 - Booster) 07/01/2021  . TETANUS/TDAP  01/30/2031  . INFLUENZA VACCINE  Completed  . PNA vac Low Risk Adult  Completed  . HPV VACCINES  Aged Out    Health Maintenance  Health Maintenance Due  Topic Date Due  . Hepatitis C Screening  Never done    Colorectal cancer screening: decline  Lung Cancer Screening: (Low Dose CT Chest recommended if Age 36-80 years, 30 pack-year currently smoking OR have quit w/in 15years.) does not qualify.   Lung Cancer Screening Referral: no  Additional Screening:  Hepatitis C Screening: does qualify; due  Vision Screening: Recommended annual ophthalmology exams for early detection of glaucoma and other disorders of the eye. Is the patient up to date with their annual eye exam?  No  Who is the provider or what is the name of the office in which the patient attends annual eye exams? none If pt is not established with a provider, would they like to be referred to a provider to establish care? No .   Dental Screening:  Recommended annual dental exams for proper oral hygiene  Community Resource Referral / Chronic Care Management: CRR required this  visit?  No   CCM required this visit?  No      Plan:     I have personally reviewed and noted the following in the patient's chart:   . Medical and social history . Use of alcohol, tobacco or illicit drugs  . Current medications and supplements . Functional ability and status . Nutritional status . Physical activity . Advanced directives . List of other physicians . Hospitalizations, surgeries, and ER visits in previous 12 months . Vitals . Screenings to include cognitive, depression, and falls . Referrals and appointments  In addition, I have reviewed and discussed with patient certain preventive protocols, quality metrics, and best practice recommendations. A written personalized care plan for preventive services as well as general preventive health recommendations were provided to patient.     Kellie Simmering, LPN   [id number]   Nurse Notes:

## 2021-01-31 NOTE — Patient Instructions (Signed)
Thomas [name] , Thank you for taking time to come for your Medicare Wellness Visit. I appreciate your ongoing commitment to your health goals. Please review the following plan we discussed and let me know if I can assist you in the future.   Screening recommendations/referrals: Colonoscopy: decline Recommended yearly ophthalmology/optometry visit for glaucoma screening and checkup Recommended yearly dental visit for hygiene and checkup  Vaccinations: Influenza vaccine: completed 08/08/2020, due 06/30/2021 Pneumococcal vaccine: completed 01/07/2016 Tdap vaccine: completed 01/29/2021, due 01/30/2031 Shingles vaccine: discussed   Covid-19:  01/01/2021, 06/04/2020, 05/07/2020  Advanced directives: Advance directive discussed with you today. .  Conditions/risks identified: none  Next appointment: Follow up in one year for your annual wellness visit.   Preventive Care 75 Years and Older, Male Preventive care refers to lifestyle choices and visits with your health care provider that can promote health and wellness. What does preventive care include?  A yearly physical exam. This is also called an annual well check.  Dental exams once or twice a year.  Routine eye exams. Ask your health care provider how often you should have your eyes checked.  Personal lifestyle choices, including:  Daily care of your teeth and gums.  Regular physical activity.  Eating a healthy diet.  Avoiding tobacco and drug use.  Limiting alcohol use.  Practicing safe sex.  Taking low doses of aspirin every day.  Taking vitamin and mineral supplements as recommended by your health care provider. What happens during an annual well check? The services and screenings done by your health care provider during your annual well check will depend on your age, overall health, lifestyle risk factors, and family history of disease. Counseling  Your health care provider may ask you questions about your:  Alcohol use.  Tobacco  use.  Drug use.  Emotional well-being.  Home and relationship well-being.  Sexual activity.  Eating habits.  History of falls.  Memory and ability to understand (cognition).  Work and work Statistician. Screening  You may have the following tests or measurements:  Height, weight, and BMI.  Blood pressure.  Lipid and cholesterol levels. These may be checked every 5 years, or more frequently if you are over 6 years old.  Skin check.  Lung cancer screening. You may have this screening every year starting at age 11 if you have a 30-pack-year history of smoking and currently smoke or have quit within the past 15 years.  Fecal occult blood test (FOBT) of the stool. You may have this test every year starting at age 81.  Flexible sigmoidoscopy or colonoscopy. You may have a sigmoidoscopy every 5 years or a colonoscopy every 10 years starting at age 32.  Prostate cancer screening. Recommendations will vary depending on your family history and other risks.  Hepatitis C blood test.  Hepatitis B blood test.  Sexually transmitted disease (STD) testing.  Diabetes screening. This is done by checking your blood sugar (glucose) after you have not eaten for a while (fasting). You may have this done every 1-3 years.  Abdominal aortic aneurysm (AAA) screening. You may need this if you are a current or former smoker.  Osteoporosis. You may be screened starting at age 64 if you are at high risk. Talk with your health care provider about your test results, treatment options, and if necessary, the need for more tests. Vaccines  Your health care provider may recommend certain vaccines, such as:  Influenza vaccine. This is recommended every year.  Tetanus, diphtheria, and acellular pertussis (Tdap, Td)  vaccine. You may need a Td booster every 10 years.  Zoster vaccine. You may need this after age 28.  Pneumococcal 13-valent conjugate (PCV13) vaccine. One dose is recommended after age  28.  Pneumococcal polysaccharide (PPSV23) vaccine. One dose is recommended after age 62. Talk to your health care provider about which screenings and vaccines you need and how often you need them. This information is not intended to replace advice given to you by your health care provider. Make sure you discuss any questions you have with your health care provider. Document Released: 12/13/2015 Document Revised: 08/05/2016 Document Reviewed: 09/17/2015 Elsevier Interactive Patient Education  2017 [employer] Prevention in the Home Falls can cause injuries. They can happen to people of all ages. There are many things you can do to make your home safe and to help prevent falls. What can I do on the outside of my home?  Regularly fix the edges of walkways and driveways and fix any cracks.  Remove anything that might make you trip as you walk through a door, such as a raised step or threshold.  Trim any bushes or trees on the path to your home.  Use bright outdoor lighting.  Clear any walking paths of anything that might make someone trip, such as rocks or tools.  Regularly check to see if handrails are loose or broken. Make sure that both sides of any steps have handrails.  Any raised decks and porches should have guardrails on the edges.  Have any leaves, snow, or ice cleared regularly.  Use sand or salt on walking paths during winter.  Clean up any spills in your garage right away. This includes oil or grease spills. What can I do in the bathroom?  Use night lights.  Install grab bars by the toilet and in the tub and shower. Do not use towel bars as grab bars.  Use non-skid mats or decals in the tub or shower.  If you need to sit down in the shower, use a plastic, non-slip stool.  Keep the floor dry. Clean up any water that spills on the floor as soon as it happens.  Remove soap buildup in the tub or shower regularly.  Attach bath mats securely with double-sided  non-slip rug tape.  Do not have throw rugs and other things on the floor that can make you trip. What can I do in the bedroom?  Use night lights.  Make sure that you have a light by your bed that is easy to reach.  Do not use any sheets or blankets that are too big for your bed. They should not hang down onto the floor.  Have a firm chair that has side arms. You can use this for support while you get dressed.  Do not have throw rugs and other things on the floor that can make you trip. What can I do in the kitchen?  Clean up any spills right away.  Avoid walking on wet floors.  Keep items that you use a lot in easy-to-reach places.  If you need to reach something above you, use a strong step stool that has a grab bar.  Keep electrical cords out of the way.  Do not use floor polish or wax that makes floors slippery. If you must use wax, use non-skid floor wax.  Do not have throw rugs and other things on the floor that can make you trip. What can I do with my stairs?  Do not leave  any items on the stairs.  Make sure that there are handrails on both sides of the stairs and use them. Fix handrails that are broken or loose. Make sure that handrails are as long as the stairways.  Check any carpeting to make sure that it is firmly attached to the stairs. Fix any carpet that is loose or worn.  Avoid having throw rugs at the top or bottom of the stairs. If you do have throw rugs, attach them to the floor with carpet tape.  Make sure that you have a light switch at the top of the stairs and the bottom of the stairs. If you do not have them, ask someone to add them for you. What else can I do to help prevent falls?  Wear shoes that:  Do not have high heels.  Have rubber bottoms.  Are comfortable and fit you well.  Are closed at the toe. Do not wear sandals.  If you use a stepladder:  Make sure that it is fully opened. Do not climb a closed stepladder.  Make sure that both  sides of the stepladder are locked into place.  Ask someone to hold it for you, if possible.  Clearly mark and make sure that you can see:  Any grab bars or handrails.  First and last steps.  Where the edge of each step is.  Use tools that help you move around (mobility aids) if they are needed. These include:  Canes.  Walkers.  Scooters.  Crutches.  Turn on the lights when you go into a dark area. Replace any light bulbs as soon as they burn out.  Set up your furniture so you have a clear path. Avoid moving your furniture around.  If any of your floors are uneven, fix them.  If there are any pets around you, be aware of where they are.  Review your medicines with your doctor. Some medicines can make you feel dizzy. This can increase your chance of falling. Ask your doctor what other things that you can do to help prevent falls. This information is not intended to replace advice given to you by your health care provider. Make sure you discuss any questions you have with your health care provider. Document Released: 09/12/2009 Document Revised: 04/23/2016 Document Reviewed: 12/21/2014 Elsevier Interactive Patient Education  2017 [employer].

## 2021-03-02 NOTE — Progress Notes (Signed)
BP 134/75   Pulse (!) 57   Temp (!) 97.5 F (36.4 C)   Wt 182 lb 6 oz (82.7 kg)   SpO2 98%   BMI 25.08 kg/m    Subjective:    Patient ID: Thomas [name], male    DOB: 03/16/1946, 75 y.o.   MRN: [id number]  HPI: Thomas [name] is a 75 y.o. male  No chief complaint on file.  This patient is a 75 y.o. male who presents for coumadin management. 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  His last labs on 11/25/20 noted INR 2.9.  WBC on repeat had returned to normal at 7.8.  CBC did show mild anemia with HGB of 12.2.  Patient denies any recent bleeding.  Denies night sweats, fever, fatigue, or weight loss.  COUMADIN CHECK Present Coumadin dose: 2.5 mg of Coumadin on Monday, Wednesday, Friday &  5 mg of Coumadin on Tuesday, Thursday, Saturday, Sunday Goal: 2.0-3.0 2.0-3.0 Excessive bruising: no Nose bleeding: no Rectal bleeding: no Prolonged menstrual cycles: N/A Eating diet with consistent amounts of foods containing Vitamin K:yes Any recent antibiotic use? no Relevant past medical, surgical, family and social history reviewed and updated as indicated. Interim medical history since our last visit reviewed. Allergies and medications reviewed and updated.  Review of Systems  Constitutional: Negative for chills, diaphoresis, fatigue, fever and unexpected weight change.    Per HPI unless specifically indicated above     Objective:    BP 134/75   Pulse (!) 57   Temp (!) 97.5 F (36.4 C)   Wt 182 lb 6 oz (82.7 kg)   SpO2 98%   BMI 25.08 kg/m   Wt Readings from Last 3 Encounters:  03/03/21 182 lb 6 oz (82.7 kg)  01/31/21 170 lb (77.1 kg)  01/29/21 170 lb 8 oz (77.3 kg)    Physical Exam Vitals and nursing note reviewed.  Constitutional:      General: He is not in acute distress.    Appearance: Normal appearance. He is not ill-appearing,  toxic-appearing or diaphoretic.  HENT:     Head: Normocephalic.     Right Ear: External ear normal.     Left Ear: External ear normal.     Nose: Nose normal. No congestion or rhinorrhea.     Mouth/Throat:     Mouth: Mucous membranes are moist.  Eyes:     General:        Right eye: No discharge.        Left eye: No discharge.     Extraocular Movements: Extraocular movements intact.     Conjunctiva/sclera: Conjunctivae normal.     Pupils: Pupils are equal, round, and reactive to light.  Cardiovascular:     Rate and Rhythm: Normal rate and regular rhythm.     Heart sounds: No murmur heard.   Pulmonary:     Effort: Pulmonary effort is normal. No respiratory distress.     Breath sounds: Normal breath sounds. No wheezing, rhonchi or rales.  Abdominal:     General: Abdomen is flat. Bowel sounds are normal.  Musculoskeletal:     Cervical back: Normal range of motion and neck supple.  Skin:    General: Skin is warm and dry.     Capillary Refill: Capillary refill takes [name] than 2 seconds.  Neurological:     General: No focal deficit present.  Mental Status: He is alert and oriented to person, place, and time.  Psychiatric:        Mood and Affect: Mood normal.        Behavior: Behavior normal.        Thought Content: Thought content normal.        Judgment: Judgment normal.     Results for orders placed or performed in visit on 01/29/21  Comprehensive metabolic panel  Result Value Ref Range   Glucose 109 (H) 65 - 99 mg/dL   BUN 13 8 - 27 mg/dL   Creatinine, Ser 1.12 0.76 - 1.27 mg/dL   eGFR 69 >59 mL/min/1.73   BUN/Creatinine Ratio 12 10 - 24   Sodium 143 134 - 144 mmol/L   Potassium 4.4 3.5 - 5.2 mmol/L   Chloride 104 96 - 106 mmol/L   CO2 20 20 - 29 mmol/L   Calcium 9.7 8.6 - 10.2 mg/dL   Total Protein 7.5 6.0 - 8.5 g/dL   Albumin 4.5 3.7 - 4.7 g/dL   Globulin, Total 3.0 1.5 - 4.5 g/dL   Albumin/Globulin Ratio 1.5 1.2 - 2.2   Bilirubin Total 0.4 0.0 - 1.2 mg/dL    Alkaline Phosphatase 67 44 - 121 IU/L   AST 16 0 - 40 IU/L   ALT 8 0 - 44 IU/L      Assessment & Plan:   Problem List Items Addressed This Visit      Hematopoietic and Hemostatic   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  Goal INR 2.0-3.0.  INR today 2.9.  Continue current dosing-2.5 mg of Coumadin on Monday, Wednesday, Friday & 5 mg of Coumadin on Tuesday, Thursday, Saturday, Sunday.  Follow-up INR in 4 weeks.  Bleeding precautions discussed with patient at visit today.       Relevant Orders   CoaguChek XS/INR Waived     Other   Encounter for current long-term use of anticoagulants    Chronic, stable.  Goal INR 2.0-3.0.  INR today 2.9.  Continue current dosing-2.5 mg of Coumadin on Monday, Wednesday, Friday & 5 mg of Coumadin on Tuesday, Thursday, Saturday, Sunday.  Follow-up INR in 4 weeks.  Bleeding precautions discussed with patient at visit today.       Relevant Orders   CoaguChek XS/INR Waived       Follow up plan: Return in about 1 year (around 03/03/2022) for Coag check.   A total of 20 minutes were spent on this encounter today.  When total time is documented, this includes both the face-to-face and non-face-to-face time personally spent before, during and after the visit on the date of the encounter.

## 2021-03-03 ENCOUNTER — Encounter: Payer: Self-pay | Admitting: Nurse Practitioner

## 2021-03-03 ENCOUNTER — Other Ambulatory Visit: Payer: Self-pay

## 2021-03-03 ENCOUNTER — Ambulatory Visit (INDEPENDENT_AMBULATORY_CARE_PROVIDER_SITE_OTHER): Payer: Medicare Other | Admitting: Nurse Practitioner

## 2021-03-03 VITALS — BP 134/75 | HR 57 | Temp 97.5°F | Wt 182.4 lb

## 2021-03-03 DIAGNOSIS — D6859 Other primary thrombophilia: Secondary | ICD-10-CM | POA: Diagnosis not present

## 2021-03-03 DIAGNOSIS — Z7901 Long term (current) use of anticoagulants: Secondary | ICD-10-CM

## 2021-03-03 LAB — COAGUCHEK XS/INR WAIVED
INR: 2.9 — ABNORMAL HIGH (ref 0.9–1.1)
Prothrombin Time: 34.3 s

## 2021-03-03 NOTE — Progress Notes (Signed)
Results discussed with patient during office visit.

## 2021-03-03 NOTE — Assessment & Plan Note (Signed)
Chronic, stable.  Goal INR 2.0-3.0.  INR today 2.9.  Continue current dosing-2.5 mg of Coumadin on Monday, Wednesday, Friday & 5 mg of Coumadin on Tuesday, Thursday, Saturday, Sunday.  Follow-up INR in 4 weeks.  Bleeding precautions discussed with patient at visit today.

## 2021-03-26 ENCOUNTER — Other Ambulatory Visit: Payer: Self-pay | Admitting: Nurse Practitioner

## 2021-03-26 NOTE — Telephone Encounter (Signed)
Requested Prescriptions  Pending Prescriptions Disp Refills  . atorvastatin (LIPITOR) 10 MG tablet [Pharmacy Med Name: ATORVASTATIN 10 MG TABLET] 90 tablet 0    Sig: TAKE 1 TABLET BY MOUTH EVERY DAY     Cardiovascular:  Antilipid - Statins Failed - 03/26/2021  1:20 AM      Failed - LDL in normal range and within 360 days    LDL Chol Calc (NIH)  Date Value Ref Range Status  01/29/2021 65 0 - 99 mg/dL Final         Passed - Total Cholesterol in normal range and within 360 days    Cholesterol, Total  Date Value Ref Range Status  01/29/2021 120 100 - 199 mg/dL Final         Passed - HDL in normal range and within 360 days    HDL  Date Value Ref Range Status  01/29/2021 41 >39 mg/dL Final         Passed - Triglycerides in normal range and within 360 days    Triglycerides  Date Value Ref Range Status  01/29/2021 68 0 - 149 mg/dL Final         Passed - Patient is not pregnant      Passed - Valid encounter within last 12 months    Recent Outpatient Visits          3 weeks ago Antithrombin 3 deficiency ([place])   [place]roll, Karen, NP   1 month ago Antithrombin 3 deficiency ([place])   [facility] Jon Billings, NP   2 months ago Antithrombin 3 deficiency ([place])   [facility] Jon Billings, NP   4 months ago Antithrombin 3 deficiency [facility])   [facility] [place] [employer] T, NP   4 months ago H/O deep venous thrombosis   [facility] Eulogio Bear, NP      Future Appointments            In 3 weeks Jon Billings, NP [facility], [place]   In 29 months  [employer], [place]

## 2021-04-16 NOTE — Progress Notes (Signed)
BP 111/62   Pulse 67   Temp 98.4 F (36.9 C)   Wt 173 lb 2 oz (78.5 kg)   SpO2 97%   BMI 23.81 kg/m    Subjective:    Patient ID: Thomas [name], male    DOB: [birth year], 75 y.o.   MRN: [id number]  HPI: Thomas [name] is a 75 y.o. male  Chief Complaint  Patient presents with  . Coagulation Disorder   This patient is a 75 y.o. male who presents for coumadin management. 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  Patient denies any recent bleeding.  Denies night sweats, fever, fatigue, or weight loss.  COUMADIN CHECK Present Coumadin dose: 2.5 mg of Coumadin on Monday, Wednesday, Friday &  5 mg of Coumadin on Tuesday, Thursday, Saturday, Sunday Goal: 2.0-3.0 2.0-3.0 Excessive bruising: no Nose bleeding: no Rectal bleeding: no Prolonged menstrual cycles: N/A Eating diet with consistent amounts of foods containing Vitamin K:yes Any recent antibiotic use? no Relevant past medical, surgical, family and social history reviewed and updated as indicated. Interim medical history since our last visit reviewed. Allergies and medications reviewed and updated.  Review of Systems  Constitutional: Negative for chills, diaphoresis, fatigue, fever and unexpected weight change.    Per HPI unless specifically indicated above     Objective:    BP 111/62   Pulse 67   Temp 98.4 F (36.9 C)   Wt 173 lb 2 oz (78.5 kg)   SpO2 97%   BMI 23.81 kg/m   Wt Readings from Last 3 Encounters:  04/17/21 173 lb 2 oz (78.5 kg)  03/03/21 182 lb 6 oz (82.7 kg)  01/31/21 170 lb (77.1 kg)    Physical Exam Vitals and nursing note reviewed.  Constitutional:      General: He is not in acute distress.    Appearance: Normal appearance. He is not ill-appearing, toxic-appearing or diaphoretic.  HENT:     Head: Normocephalic.     Right Ear: External ear normal.     Left  Ear: External ear normal.     Nose: Nose normal. No congestion or rhinorrhea.     Mouth/Throat:     Mouth: Mucous membranes are moist.  Eyes:     General:        Right eye: No discharge.        Left eye: No discharge.     Extraocular Movements: Extraocular movements intact.     Conjunctiva/sclera: Conjunctivae normal.     Pupils: Pupils are equal, round, and reactive to light.  Cardiovascular:     Rate and Rhythm: Normal rate and regular rhythm.     Heart sounds: No murmur heard.   Pulmonary:     Effort: Pulmonary effort is normal. No respiratory distress.     Breath sounds: Normal breath sounds. No wheezing, rhonchi or rales.  Abdominal:     General: Abdomen is flat. Bowel sounds are normal.  Musculoskeletal:     Cervical back: Normal range of motion and neck supple.  Skin:    General: Skin is warm and dry.     Capillary Refill: Capillary refill takes [name] than 2 seconds.  Neurological:     General: No focal deficit present.     Mental Status: He is alert and oriented to person, place, and time.  Psychiatric:        Mood and  Affect: Mood normal.        Behavior: Behavior normal.        Thought Content: Thought content normal.        Judgment: Judgment normal.     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  Goal INR 2.0-3.0.  INR had to be sent out.  Will call patient with result.  Continue current dosing-2.5 mg of Coumadin on Monday, Wednesday, Friday & 5 mg of Coumadin on Tuesday, Thursday, Saturday, Sunday.  Follow-up INR in 4 weeks.  Bleeding precautions discussed with patient at visit today.       Relevant Orders   Protime-INR     Other   H/O deep venous thrombosis    Chronic,  stable.  Goal INR 2.0-3.0.  Continue current dosing-2.5 mg of Coumadin on Monday, Wednesday, Friday & 5 mg of Coumadin on Tuesday, Thursday, Saturday, Sunday.  Follow-up INR in 4 weeks.  Bleeding precautions discussed with patient at visit today.       Relevant Orders   Protime-INR   Encounter for current long-term use of anticoagulants    Chronic, stable.  Goal INR 2.0-3.0.  INR had to be sent out today.  Will call patient with result.  Continue current dosing-2.5 mg of Coumadin on Monday, Wednesday, Friday & 5 mg of Coumadin on Tuesday, Thursday, Saturday, Sunday.  Follow-up INR in 4 weeks.  Bleeding precautions discussed with patient at visit today.       Relevant Orders   Protime-INR       Follow up plan: Return in about 1 month (around 05/18/2021) for Coag check.   A total of 20 minutes were spent on this encounter today.  When total time is documented, this includes both the face-to-face and non-face-to-face time personally spent before, during and after the visit on the date of the encounter.

## 2021-04-17 ENCOUNTER — Encounter: Payer: Self-pay | Admitting: Nurse Practitioner

## 2021-04-17 ENCOUNTER — Other Ambulatory Visit: Payer: Self-pay

## 2021-04-17 ENCOUNTER — Ambulatory Visit (INDEPENDENT_AMBULATORY_CARE_PROVIDER_SITE_OTHER): Payer: Medicare Other | Admitting: Nurse Practitioner

## 2021-04-17 VITALS — BP 111/62 | HR 67 | Temp 98.4°F | Wt 173.1 lb

## 2021-04-17 DIAGNOSIS — Z86718 Personal history of other venous thrombosis and embolism: Secondary | ICD-10-CM

## 2021-04-17 DIAGNOSIS — Z7901 Long term (current) use of anticoagulants: Secondary | ICD-10-CM

## 2021-04-17 DIAGNOSIS — D6859 Other primary thrombophilia: Secondary | ICD-10-CM | POA: Diagnosis not present

## 2021-04-17 NOTE — Assessment & Plan Note (Signed)
Chronic, stable.  Goal INR 2.0-3.0.  INR had to be sent out today.  Will call patient with result.  Continue current dosing-2.5 mg of Coumadin on Monday, Wednesday, Friday & 5 mg of Coumadin on Tuesday, Thursday, Saturday, Sunday.  Follow-up INR in 4 weeks.  Bleeding precautions discussed with patient at visit today.

## 2021-04-17 NOTE — Assessment & Plan Note (Signed)
Chronic, stable.  Goal INR 2.0-3.0.  INR had to be sent out.  Will call patient with result.  Continue current dosing-2.5 mg of Coumadin on Monday, Wednesday, Friday & 5 mg of Coumadin on Tuesday, Thursday, Saturday, Sunday.  Follow-up INR in 4 weeks.  Bleeding precautions discussed with patient at visit today.

## 2021-04-17 NOTE — Assessment & Plan Note (Signed)
Chronic, stable.  Goal INR 2.0-3.0.  Continue current dosing-2.5 mg of Coumadin on Monday, Wednesday, Friday & 5 mg of Coumadin on Tuesday, Thursday, Saturday, Sunday.  Follow-up INR in 4 weeks.  Bleeding precautions discussed with patient at visit today.

## 2021-04-18 LAB — PROTIME-INR
INR: 2.1 — ABNORMAL HIGH (ref 0.9–1.2)
Prothrombin Time: 21 s — ABNORMAL HIGH (ref 9.1–12.0)

## 2021-04-18 NOTE — Progress Notes (Signed)
Please let Thomas [name] know that his INR is 2.1.  He should continue his current dose of coumadin.  We will recheck it in 1 month.

## 2021-04-25 ENCOUNTER — Other Ambulatory Visit: Payer: Self-pay | Admitting: Family Medicine

## 2021-05-19 ENCOUNTER — Ambulatory Visit (INDEPENDENT_AMBULATORY_CARE_PROVIDER_SITE_OTHER): Payer: Medicare Other | Admitting: Nurse Practitioner

## 2021-05-19 ENCOUNTER — Encounter: Payer: Self-pay | Admitting: Nurse Practitioner

## 2021-05-19 ENCOUNTER — Other Ambulatory Visit: Payer: Self-pay

## 2021-05-19 VITALS — BP 126/74 | HR 61 | Temp 98.1°F | Wt 172.4 lb

## 2021-05-19 DIAGNOSIS — Z7901 Long term (current) use of anticoagulants: Secondary | ICD-10-CM | POA: Diagnosis not present

## 2021-05-19 DIAGNOSIS — D6859 Other primary thrombophilia: Secondary | ICD-10-CM

## 2021-05-19 LAB — COAGUCHEK XS/INR WAIVED
INR: 3.2 — ABNORMAL HIGH (ref 0.9–1.1)
Prothrombin Time: 37.9 s

## 2021-05-19 NOTE — Progress Notes (Signed)
I already discussed the results with patient during the office visit. I want to move his appointment up to 2 weeks instead of one month.  Can you make sure his appointment gets changed.

## 2021-05-19 NOTE — Assessment & Plan Note (Signed)
Chronic, elevated at visit today.  Goal INR 2.0-3.0.  Continue current dosing-2.5 mg of Coumadin on Monday, Wednesday, Friday and Sunday & 5 mg of Coumadin on Tuesday, Thursday, Saturday.  Follow-up INR in 4 weeks.  Bleeding precautions discussed with patient at visit today.

## 2021-05-19 NOTE — Progress Notes (Signed)
BP 126/74   Pulse 61   Temp 98.1 F (36.7 C)   Wt 172 lb 6 oz (78.2 kg)   SpO2 97%   BMI 23.71 kg/m    Subjective:    Patient ID: Thomas [name], male    DOB: 10-28-1946, 75 y.o.   MRN: [id number]  HPI: Thomas [name] is a 75 y.o. male  Chief Complaint  Patient presents with   Anticoagulation    This patient is a 75 y.o. male who presents for coumadin management. 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  Patient denies any recent bleeding.  Denies night sweats, fever, fatigue, or weight loss.  COUMADIN CHECK Present Coumadin dose: 2.5 mg of Coumadin on Monday, Wednesday, Friday &  5 mg of Coumadin on Tuesday, Thursday, Saturday, Sunday Goal: 2.0-3.0 2.0-3.0 Excessive bruising: no Nose bleeding: no Rectal bleeding: no Prolonged menstrual cycles: N/A Eating diet with consistent amounts of foods containing Vitamin K:yes Any recent antibiotic use? no Relevant past medical, surgical, family and social history reviewed and updated as indicated. Interim medical history since our last visit reviewed. Allergies and medications reviewed and updated.  Review of Systems  Constitutional:  Negative for chills, diaphoresis, fatigue, fever and unexpected weight change.   Per HPI unless specifically indicated above     Objective:    BP 126/74   Pulse 61   Temp 98.1 F (36.7 C)   Wt 172 lb 6 oz (78.2 kg)   SpO2 97%   BMI 23.71 kg/m   Wt Readings from Last 3 Encounters:  05/19/21 172 lb 6 oz (78.2 kg)  04/17/21 173 lb 2 oz (78.5 kg)  03/03/21 182 lb 6 oz (82.7 kg)    Physical Exam Vitals and nursing note reviewed.  Constitutional:      General: He is not in acute distress.    Appearance: Normal appearance. He is not ill-appearing, toxic-appearing or diaphoretic.  HENT:     Head: Normocephalic.     Right Ear: External ear normal.     Left  Ear: External ear normal.     Nose: Nose normal. No congestion or rhinorrhea.     Mouth/Throat:     Mouth: Mucous membranes are moist.  Eyes:     General:        Right eye: No discharge.        Left eye: No discharge.     Extraocular Movements: Extraocular movements intact.     Conjunctiva/sclera: Conjunctivae normal.     Pupils: Pupils are equal, round, and reactive to light.  Cardiovascular:     Rate and Rhythm: Normal rate and regular rhythm.     Heart sounds: No murmur heard. Pulmonary:     Effort: Pulmonary effort is normal. No respiratory distress.     Breath sounds: Normal breath sounds. No wheezing, rhonchi or rales.  Abdominal:     General: Abdomen is flat. Bowel sounds are normal.  Musculoskeletal:     Cervical back: Normal range of motion and neck supple.  Skin:    General: Skin is warm and dry.     Capillary Refill: Capillary refill takes [name] than 2 seconds.  Neurological:     General: No focal deficit present.     Mental Status: He is alert and oriented to person, place, and time.  Psychiatric:        Mood and  Affect: Mood normal.        Behavior: Behavior normal.        Thought Content: Thought content normal.        Judgment: Judgment normal.    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  Goal INR 2.0-3.0.  Continue current dosing-2.5 mg of Coumadin on Monday, Wednesday, Friday and Sunday & 5 mg of Coumadin on Tuesday, Thursday, Saturday.  Follow-up INR in 4 weeks.  Bleeding precautions discussed with patient at visit today.        Relevant Orders   CoaguChek XS/INR Waived     Other   Encounter for current long-term use of anticoagulants     Chronic, elevated at visit today.  Goal INR 2.0-3.0.  Continue current dosing-2.5 mg of Coumadin on Monday, Wednesday, Friday and Sunday & 5 mg of Coumadin on Tuesday, Thursday, Saturday.  Follow-up INR in 4 weeks.  Bleeding precautions discussed with patient at visit today.        Relevant Orders   CoaguChek XS/INR Waived     Follow up plan: Return in about 1 month (around 06/18/2021) for Coag check.   A total of 20 minutes were spent on this encounter today.  When total time is documented, this includes both the face-to-face and non-face-to-face time personally spent before, during and after the visit on the date of the encounter.

## 2021-05-19 NOTE — Patient Instructions (Signed)
Coumadin 2.5mg  - Monday, Wednesday, Friday and Sunday Coumadin 5mg - Tuesday, Thursday, and Saturday

## 2021-06-05 ENCOUNTER — Ambulatory Visit (INDEPENDENT_AMBULATORY_CARE_PROVIDER_SITE_OTHER): Payer: Medicare Other | Admitting: Nurse Practitioner

## 2021-06-05 ENCOUNTER — Other Ambulatory Visit: Payer: Self-pay

## 2021-06-05 ENCOUNTER — Encounter: Payer: Self-pay | Admitting: Nurse Practitioner

## 2021-06-05 VITALS — BP 134/68 | HR 65 | Temp 98.3°F | Wt 172.0 lb

## 2021-06-05 DIAGNOSIS — D6859 Other primary thrombophilia: Secondary | ICD-10-CM | POA: Diagnosis not present

## 2021-06-05 DIAGNOSIS — Z7901 Long term (current) use of anticoagulants: Secondary | ICD-10-CM | POA: Diagnosis not present

## 2021-06-05 LAB — COAGUCHEK XS/INR WAIVED
INR: 2.2 — ABNORMAL HIGH (ref 0.9–1.1)
Prothrombin Time: 26.4 s

## 2021-06-05 NOTE — Assessment & Plan Note (Addendum)
Chronic, INR today was 2.2 which is back within goal. Goal INR 2.0-3.0.  Continue current dosing-2.5 mg of Coumadin on Monday, Wednesday, Friday and Sunday & 5 mg of Coumadin on Tuesday, Thursday, Saturday.  Follow-up INR in 4 weeks.  Bleeding precautions discussed with patient at visit today.

## 2021-06-05 NOTE — Assessment & Plan Note (Addendum)
Chronic, improved to 2.2 which is within goal.  Goal INR 2.0-3.0.  Continue current dosing-2.5 mg of Coumadin on Monday, Wednesday, Friday and Sunday & 5 mg of Coumadin on Tuesday, Thursday, Saturday.  Follow-up INR in 4 weeks.  Bleeding precautions discussed with patient at visit today.

## 2021-06-05 NOTE — Progress Notes (Signed)
Results discussed with patient during office visit.

## 2021-06-05 NOTE — Progress Notes (Signed)
BP 134/68   Pulse 65   Temp 98.3 F (36.8 C)   Wt 172 lb (78 kg)   SpO2 97%   BMI 23.65 kg/m    Subjective:    Patient ID: Thomas [name], male    DOB: 21-Sep-1946, 75 y.o.   MRN: [id number]  HPI: DAMEIR GENTZLER is a 75 y.o. male  Chief Complaint  Patient presents with   Coagulation Disorder    This patient is a 75 y.o. male who presents for coumadin management. His last INR was 3.2.  He is here for a 2 week visit to ensure he is back within range.  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  Patient denies any recent bleeding.  Denies night sweats, fever, fatigue, or weight loss.  COUMADIN CHECK INR Today: 2.2 Present Coumadin dose: 2.5 mg of Coumadin on Monday, Wednesday, Friday & Sunday 5 mg of Coumadin on Tuesday, Thursday, Saturday Goal: 2.0-3.0 Excessive bruising: no Nose bleeding: no Rectal bleeding: no Prolonged menstrual cycles: N/A Eating diet with consistent amounts of foods containing Vitamin K:yes Any recent antibiotic use? no Relevant past medical, surgical, family and social history reviewed and updated as indicated. Interim medical history since our last visit reviewed. Allergies and medications reviewed and updated.  Review of Systems  Constitutional:  Negative for chills, diaphoresis, fatigue, fever and unexpected weight change.   Per HPI unless specifically indicated above     Objective:    BP 134/68   Pulse 65   Temp 98.3 F (36.8 C)   Wt 172 lb (78 kg)   SpO2 97%   BMI 23.65 kg/m   Wt Readings from Last 3 Encounters:  06/05/21 172 lb (78 kg)  05/19/21 172 lb 6 oz (78.2 kg)  04/17/21 173 lb 2 oz (78.5 kg)    Physical Exam Vitals and nursing note reviewed.  Constitutional:      General: He is not in acute distress.    Appearance: Normal appearance. He is not ill-appearing, toxic-appearing or diaphoretic.  HENT:      Head: Normocephalic.     Right Ear: External ear normal.     Left Ear: External ear normal.     Nose: Congestion present. No rhinorrhea.     Mouth/Throat:     Mouth: Mucous membranes are moist.  Eyes:     General:        Right eye: No discharge.        Left eye: No discharge.     Extraocular Movements: Extraocular movements intact.     Conjunctiva/sclera: Conjunctivae normal.     Pupils: Pupils are equal, round, and reactive to light.  Cardiovascular:     Rate and Rhythm: Normal rate and regular rhythm.     Heart sounds: No murmur heard. Pulmonary:     Effort: Pulmonary effort is normal. No respiratory distress.     Breath sounds: Normal breath sounds. No wheezing, rhonchi or rales.  Abdominal:     General: Abdomen is flat. Bowel sounds are normal.  Musculoskeletal:     Cervical back: Normal range of motion and neck supple.  Skin:    General: Skin is warm and dry.     Capillary Refill: Capillary refill takes [name] than 2 seconds.  Neurological:     General: No focal deficit present.     Mental Status: He is alert and oriented to person,  place, and time.  Psychiatric:        Mood and Affect: Mood normal.        Behavior: Behavior normal.        Thought Content: Thought content normal.        Judgment: Judgment normal.    Results for orders placed or performed in visit on 05/19/21  CoaguChek XS/INR Waived  Result Value Ref Range   INR 3.2 (H) 0.9 - 1.1   Prothrombin Time 37.9 sec      Assessment & Plan:   Problem List Items Addressed This Visit       Hematopoietic and Hemostatic   Antithrombin 3 deficiency (HCC) - Primary    Chronic, INR today was 2.2 which is back within goal. Goal INR 2.0-3.0.  Continue current dosing-2.5 mg of Coumadin on Monday, Wednesday, Friday and Sunday & 5 mg of Coumadin on Tuesday, Thursday, Saturday.  Follow-up INR in 4 weeks.  Bleeding precautions discussed with patient at visit today.        Relevant Orders   INR/PT   CoaguChek  XS/INR Waived (STAT)     Other   Encounter for current long-term use of anticoagulants    Chronic, improved to 2.2 which is within goal.  Goal INR 2.0-3.0.  Continue current dosing-2.5 mg of Coumadin on Monday, Wednesday, Friday and Sunday & 5 mg of Coumadin on Tuesday, Thursday, Saturday.  Follow-up INR in 4 weeks.  Bleeding precautions discussed with patient at visit today.        Relevant Orders   INR/PT   CoaguChek XS/INR Waived (STAT)     Follow up plan: Return in about 1 month (around 07/06/2021) for Coag check.   A total of 20 minutes were spent on this encounter today.  When total time is documented, this includes both the face-to-face and non-face-to-face time personally spent before, during and after the visit on the date of the encounter.

## 2021-06-19 ENCOUNTER — Ambulatory Visit: Payer: Medicare Other | Admitting: Nurse Practitioner

## 2021-06-25 ENCOUNTER — Other Ambulatory Visit: Payer: Self-pay | Admitting: Nurse Practitioner

## 2021-06-25 NOTE — Telephone Encounter (Signed)
Requested Prescriptions  Pending Prescriptions Disp Refills  . atorvastatin (LIPITOR) 10 MG tablet [Pharmacy Med Name: ATORVASTATIN 10 MG TABLET] 90 tablet 0    Sig: TAKE 1 TABLET BY MOUTH EVERY DAY     Cardiovascular:  Antilipid - Statins Passed - 06/25/2021  3:07 AM      Passed - Total Cholesterol in normal range and within 360 days    Cholesterol, Total  Date Value Ref Range Status  01/29/2021 120 100 - 199 mg/dL Final         Passed - LDL in normal range and within 360 days    LDL Chol Calc (NIH)  Date Value Ref Range Status  01/29/2021 65 0 - 99 mg/dL Final         Passed - HDL in normal range and within 360 days    HDL  Date Value Ref Range Status  01/29/2021 41 >39 mg/dL Final         Passed - Triglycerides in normal range and within 360 days    Triglycerides  Date Value Ref Range Status  01/29/2021 68 0 - 149 mg/dL Final         Passed - Patient is not pregnant      Passed - Valid encounter within last 12 months    Recent Outpatient Visits          2 weeks ago Antithrombin 3 deficiency ([place])   [place], Karen, NP   1 month ago Antithrombin 3 deficiency ([place])   [facility] Jon Billings, NP   2 months ago Antithrombin 3 deficiency ([place])   [facility] Jon Billings, NP   3 months ago Antithrombin 3 deficiency ([place])   [facility] Jon Billings, NP   4 months ago Antithrombin 3 deficiency ([place])   [place] , NP      Future Appointments            In 2 weeks Jon Billings, NP [facility], [place]   In 7 months  [employer], [employer]

## 2021-06-29 ENCOUNTER — Other Ambulatory Visit: Payer: Self-pay | Admitting: Nurse Practitioner

## 2021-06-29 DIAGNOSIS — Z86711 Personal history of pulmonary embolism: Secondary | ICD-10-CM

## 2021-06-29 NOTE — Telephone Encounter (Signed)
Requested Prescriptions  Pending Prescriptions Disp Refills  . warfarin (COUMADIN) 5 MG tablet [Pharmacy Med Name: WARFARIN SODIUM 5 MG TABLET] 90 tablet     Sig: TAKE 1 TABLET BY MOUTH EVERY DAY     Hematology:  Anticoagulants - warfarin Failed - 06/29/2021 12:44 PM      Failed - This refill cannot be delegated      Failed - If the patient is managed by Coumadin Clinic - route to their Pool. If not, forward to the provider.      Failed - INR in normal range and within 30 days    INR  Date Value Ref Range Status  06/05/2021 2.2 (H) 0.9 - 1.1 Final         Passed - Valid encounter within last 3 months    Recent Outpatient Visits          3 weeks ago Antithrombin 3 deficiency ([place])   [place] Jon Billings, NP   1 month ago Antithrombin 3 deficiency ([place])   [facility] Jon Billings, NP   2 months ago Antithrombin 3 deficiency ([place])   [facility] Jon Billings, NP   3 months ago Antithrombin 3 deficiency ([place])   [facility] Jon Billings, NP   5 months ago Antithrombin 3 deficiency ([place])   [place], NP      Future Appointments            In 1 week Jon Billings, NP [facility], [place]   In 7 months  [place], [facility]           . amLODipine ([place]) 2.5 MG tablet [Pharmacy Med Name: AMLODIPINE BESYLATE 2.5 MG TAB] 90 tablet 0    Sig: TAKE 1 TABLET BY MOUTH EVERY DAY     Cardiovascular:  Calcium Channel Blockers Passed - 06/29/2021 12:44 PM      Passed - Last BP in normal range    BP Readings from Last 1 Encounters:  06/05/21 134/68         Passed - Valid encounter within last 6 months    Recent Outpatient Visits          3 weeks ago Antithrombin 3 deficiency ([place])   [place], NP   1 month ago Antithrombin 3 deficiency ([place])   [facility] Jon Billings, NP   2 months ago Antithrombin 3  deficiency ([place])   [facility] Jon Billings, NP   3 months ago Antithrombin 3 deficiency ([place])   [facility] Jon Billings, NP   5 months ago Antithrombin 3 deficiency [facility])   [place] Jon Billings, NP      Future Appointments            In 1 week Jon Billings, NP [facility], [place]   In 7 months  [employer], [place]           . atorvastatin (LIPITOR) 10 MG tablet [Pharmacy Med Name: ATORVASTATIN 10 MG TABLET] 90 tablet     Sig: TAKE 1 TABLET BY MOUTH EVERY DAY     Cardiovascular:  Antilipid - Statins Passed - 06/29/2021 12:44 PM      Passed - Total Cholesterol in normal range and within 360 days    Cholesterol, Total  Date Value Ref Range Status  01/29/2021 120 100 - 199 mg/dL Final         Passed - LDL in  normal range and within 360 days    LDL Chol Calc (NIH)  Date Value Ref Range Status  01/29/2021 65 0 - 99 mg/dL Final         Passed - HDL in normal range and within 360 days    HDL  Date Value Ref Range Status  01/29/2021 41 >39 mg/dL Final         Passed - Triglycerides in normal range and within 360 days    Triglycerides  Date Value Ref Range Status  01/29/2021 68 0 - 149 mg/dL Final         Passed - Patient is not pregnant      Passed - Valid encounter within last 12 months    Recent Outpatient Visits          3 weeks ago Antithrombin 3 deficiency ([place])   [place], Karen, NP   1 month ago Antithrombin 3 deficiency ([place])   [facility] Jon Billings, NP   2 months ago Antithrombin 3 deficiency ([place])   [facility] Jon Billings, NP   3 months ago Antithrombin 3 deficiency ([place])   [facility] Jon Billings, NP   5 months ago Antithrombin 3 deficiency [facility])   [place], NP      Future Appointments            In 1 week Jon Billings, NP [facility], [place]   In 7 months  [employer], [place]

## 2021-06-29 NOTE — Telephone Encounter (Signed)
Requested medication (s) are due for refill today: yes  Requested medication (s) are on the active medication list: yes  Last refill:  01/27/21 #90 1 RF   Future visit scheduled: yes  Notes to clinic:  med not delegated to NT to RF   Requested Prescriptions  Pending Prescriptions Disp Refills   warfarin (COUMADIN) 5 MG tablet [Pharmacy Med Name: WARFARIN SODIUM 5 MG TABLET] 90 tablet     Sig: TAKE 1 TABLET BY [place]      Hematology:  Anticoagulants - warfarin Failed - 06/29/2021 12:44 PM      Failed - This refill cannot be delegated      Failed - If the patient is managed by Coumadin Clinic - route to their Pool. If not, forward to the provider.      Failed - INR in normal range and within 30 days    INR  Date Value Ref Range Status  06/05/2021 2.2 (H) 0.9 - 1.1 Final          Passed - Valid encounter within last 3 months    Recent Outpatient Visits           3 weeks ago Antithrombin 3 deficiency ([place])   [place] Jon Billings, NP   1 month ago Antithrombin 3 deficiency ([place])   [facility] Jon Billings, NP   2 months ago Antithrombin 3 deficiency ([place])   [facility] Jon Billings, NP   3 months ago Antithrombin 3 deficiency ([place])   [facility] Jon Billings, NP   5 months ago Antithrombin 3 deficiency ([place])   [place], NP       Future Appointments             In 1 week Jon Billings, NP [facility], [facility]   In 7 months  [employer], [facility]              Signed Prescriptions Disp Refills   amLODipine (NORVASC) 2.5 MG tablet 90 tablet 0    Sig: TAKE 1 TABLET BY MOUTH EVERY DAY      Cardiovascular:  Calcium Channel Blockers Passed - 06/29/2021 12:44 PM      Passed - Last BP in normal range    BP Readings from Last 1 Encounters:  06/05/21 134/68          Passed - Valid encounter within last 6 months    Recent  Outpatient Visits           3 weeks ago Antithrombin 3 deficiency ([place])   [facility] Jon Billings, NP   1 month ago Antithrombin 3 deficiency ([place])   [facility] Jon Billings, NP   2 months ago Antithrombin 3 deficiency ([place])   [facility] Jon Billings, NP   3 months ago Antithrombin 3 deficiency ([place])   [facility] Jon Billings, NP   5 months ago Antithrombin 3 deficiency ([place])   [place], NP       Future Appointments             In 1 week Jon Billings, NP [facility], [facility]   In 7 months  [place], [facility]              Refused Prescriptions Disp Refills   atorvastatin (LIPITOR) 10 MG tablet [Pharmacy Med Name: ATORVASTATIN 10 MG TABLET] 90 tablet     Sig: TAKE 1  TABLET BY MOUTH EVERY DAY      Cardiovascular:  Antilipid - Statins Passed - 06/29/2021 12:44 PM      Passed - Total Cholesterol in normal range and within 360 days    Cholesterol, Total  Date Value Ref Range Status  01/29/2021 120 100 - 199 mg/dL Final          Passed - LDL in normal range and within 360 days    LDL Chol Calc (NIH)  Date Value Ref Range Status  01/29/2021 65 0 - 99 mg/dL Final          Passed - HDL in normal range and within 360 days    HDL  Date Value Ref Range Status  01/29/2021 41 >39 mg/dL Final          Passed - Triglycerides in normal range and within 360 days    Triglycerides  Date Value Ref Range Status  01/29/2021 68 0 - 149 mg/dL Final          Passed - Patient is not pregnant      Passed - Valid encounter within last 12 months    Recent Outpatient Visits           3 weeks ago Antithrombin 3 deficiency ([place])   [place] Jon Billings, NP   1 month ago Antithrombin 3 deficiency ([place])   [facility] Jon Billings, NP   2 months ago Antithrombin 3 deficiency ([place])   [facility]  Jon Billings, NP   3 months ago Antithrombin 3 deficiency ([place])   [facility] Jon Billings, NP   5 months ago Antithrombin 3 deficiency [facility])   [place], NP       Future Appointments             In 1 week Jon Billings, NP [facility], [place]   In 7 months  [employer], [place]

## 2021-07-09 ENCOUNTER — Ambulatory Visit (INDEPENDENT_AMBULATORY_CARE_PROVIDER_SITE_OTHER): Payer: Medicare Other | Admitting: Nurse Practitioner

## 2021-07-09 ENCOUNTER — Encounter: Payer: Self-pay | Admitting: Nurse Practitioner

## 2021-07-09 ENCOUNTER — Other Ambulatory Visit: Payer: Self-pay

## 2021-07-09 VITALS — BP 118/66 | HR 79 | Temp 99.1°F | Wt 176.5 lb

## 2021-07-09 DIAGNOSIS — D6859 Other primary thrombophilia: Secondary | ICD-10-CM

## 2021-07-09 DIAGNOSIS — Z7901 Long term (current) use of anticoagulants: Secondary | ICD-10-CM | POA: Diagnosis not present

## 2021-07-09 LAB — COAGUCHEK XS/INR WAIVED
INR: 2.2 — ABNORMAL HIGH (ref 0.9–1.1)
Prothrombin Time: 26.3 s

## 2021-07-09 NOTE — Progress Notes (Signed)
Results discussed with patient during visit today.

## 2021-07-09 NOTE — Progress Notes (Signed)
BP 118/66   Pulse 79   Temp 99.1 F (37.3 C)   Wt 176 lb 8 oz (80.1 kg)   SpO2 93%   BMI 24.27 kg/m    Subjective:    Patient ID: Thomas [name], male    DOB: [birth year], 75 y.o.   MRN: [id number]  HPI: Thomas [name] is a 75 y.o. male  Chief Complaint  Patient presents with   Coagulation Disorder     This patient is a 75 y.o. male who presents for coumadin management. His last INR was 2.2.  He is here for a 1 month visit to ensure he is back within range.  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  Patient denies any recent bleeding.  Denies night sweats, fever, fatigue, or weight loss.  COUMADIN CHECK INR Today: 2.2 Present Coumadin dose: 2.5 mg of Coumadin on Monday, Wednesday, Friday & Sunday 5 mg of Coumadin on Tuesday, Thursday, Saturday Goal: 2.0-3.0 Excessive bruising: no Nose bleeding: no Rectal bleeding: no Prolonged menstrual cycles: N/A Eating diet with consistent amounts of foods containing Vitamin K:yes Any recent antibiotic use? no Relevant past medical, surgical, family and social history reviewed and updated as indicated. Interim medical history since our last visit reviewed. Allergies and medications reviewed and updated.  Review of Systems  Constitutional:  Negative for chills, diaphoresis, fatigue, fever and unexpected weight change.   Per HPI unless specifically indicated above     Objective:    BP 118/66   Pulse 79   Temp 99.1 F (37.3 C)   Wt 176 lb 8 oz (80.1 kg)   SpO2 93%   BMI 24.27 kg/m   Wt Readings from Last 3 Encounters:  07/09/21 176 lb 8 oz (80.1 kg)  06/05/21 172 lb (78 kg)  05/19/21 172 lb 6 oz (78.2 kg)    Physical Exam Vitals and nursing note reviewed.  Constitutional:      General: He is not in acute distress.    Appearance: Normal appearance. He is not ill-appearing, toxic-appearing or  diaphoretic.  HENT:     Head: Normocephalic.     Right Ear: External ear normal.     Left Ear: External ear normal.     Nose: No rhinorrhea.     Mouth/Throat:     Mouth: Mucous membranes are moist.  Eyes:     General:        Right eye: No discharge.        Left eye: No discharge.     Extraocular Movements: Extraocular movements intact.     Conjunctiva/sclera: Conjunctivae normal.     Pupils: Pupils are equal, round, and reactive to light.  Cardiovascular:     Rate and Rhythm: Normal rate and regular rhythm.     Heart sounds: No murmur heard. Pulmonary:     Effort: Pulmonary effort is normal. No respiratory distress.     Breath sounds: Normal breath sounds. No wheezing, rhonchi or rales.  Abdominal:     General: Abdomen is flat. Bowel sounds are normal.  Musculoskeletal:     Cervical back: Normal range of motion and neck supple.  Skin:    General: Skin is warm and dry.     Capillary Refill: Capillary refill takes [name] than 2 seconds.  Neurological:     General: No focal deficit present.     Mental Status: He is alert and  oriented to person, place, and time.  Psychiatric:        Mood and Affect: Mood normal.        Behavior: Behavior normal.        Thought Content: Thought content normal.        Judgment: Judgment normal.    Results for orders placed or performed in visit on 07/09/21  CoaguChek XS/INR Waived (STAT)  Result Value Ref Range   INR 2.2 (H) 0.9 - 1.1   Prothrombin Time 26.3 sec      Assessment & Plan:   Problem List Items Addressed This Visit       Hematopoietic and Hemostatic   Antithrombin 3 deficiency (HCC) - Primary    Chronic, improved to 2.2 which is within goal.  Goal INR 2.0-3.0.  Continue current dosing-2.5 mg of Coumadin on Monday, Wednesday, Friday and Sunday & 5 mg of Coumadin on Tuesday, Thursday, Saturday.  Follow-up INR in 1 month.  Bleeding precautions discussed with patient at visit today.        Relevant Orders   CoaguChek XS/INR  Waived (STAT) (Completed)     Other   Encounter for current long-term use of anticoagulants    Chronic, INR today was 2.2 which is back within goal. Goal INR 2.0-3.0.  Continue current dosing-2.5 mg of Coumadin on Monday, Wednesday, Friday and Sunday & 5 mg of Coumadin on Tuesday, Thursday, Saturday.  Follow-up INR in 1 month.  Bleeding precautions discussed with patient at visit today.        Relevant Orders   CoaguChek XS/INR Waived (STAT) (Completed)     Follow up plan: Return in about 1 month (around 08/09/2021) for Coag check.

## 2021-07-09 NOTE — Assessment & Plan Note (Addendum)
Chronic, improved to 2.2 which is within goal.  Goal INR 2.0-3.0.  Continue current dosing-2.5 mg of Coumadin on Monday, Wednesday, Friday and Sunday & 5 mg of Coumadin on Tuesday, Thursday, Saturday.  Follow-up INR in 1 month.  Bleeding precautions discussed with patient at visit today.

## 2021-07-09 NOTE — Assessment & Plan Note (Addendum)
Chronic, INR today was 2.2 which is back within goal. Goal INR 2.0-3.0.  Continue current dosing-2.5 mg of Coumadin on Monday, Wednesday, Friday and Sunday & 5 mg of Coumadin on Tuesday, Thursday, Saturday.  Follow-up INR in 1 month.  Bleeding precautions discussed with patient at visit today.

## 2021-08-11 ENCOUNTER — Encounter: Payer: Self-pay | Admitting: Nurse Practitioner

## 2021-08-11 ENCOUNTER — Ambulatory Visit (INDEPENDENT_AMBULATORY_CARE_PROVIDER_SITE_OTHER): Payer: Medicare Other | Admitting: Nurse Practitioner

## 2021-08-11 ENCOUNTER — Other Ambulatory Visit: Payer: Self-pay

## 2021-08-11 VITALS — BP 130/67 | HR 70 | Temp 98.4°F | Wt 177.4 lb

## 2021-08-11 DIAGNOSIS — Z86711 Personal history of pulmonary embolism: Secondary | ICD-10-CM

## 2021-08-11 DIAGNOSIS — Z7901 Long term (current) use of anticoagulants: Secondary | ICD-10-CM

## 2021-08-11 DIAGNOSIS — R7301 Impaired fasting glucose: Secondary | ICD-10-CM

## 2021-08-11 DIAGNOSIS — I10 Essential (primary) hypertension: Secondary | ICD-10-CM | POA: Diagnosis not present

## 2021-08-11 DIAGNOSIS — D6859 Other primary thrombophilia: Secondary | ICD-10-CM

## 2021-08-11 DIAGNOSIS — Z86718 Personal history of other venous thrombosis and embolism: Secondary | ICD-10-CM | POA: Diagnosis not present

## 2021-08-11 LAB — COAGUCHEK XS/INR WAIVED
INR: 2.5 — ABNORMAL HIGH (ref 0.9–1.1)
Prothrombin Time: 29.8 s

## 2021-08-11 MED ORDER — WARFARIN SODIUM 5 MG PO TABS: 5.0000 mg | ORAL_TABLET | Freq: Every day | ORAL | 1 refills | Status: DC

## 2021-08-11 MED ORDER — ATORVASTATIN CALCIUM 10 MG PO TABS: 10.0000 mg | ORAL_TABLET | Freq: Every day | ORAL | 1 refills | Status: DC

## 2021-08-11 MED ORDER — AMLODIPINE BESYLATE 2.5 MG PO TABS: 2.5000 mg | ORAL_TABLET | Freq: Every day | ORAL | 1 refills | Status: DC

## 2021-08-11 NOTE — Assessment & Plan Note (Signed)
Chronic,  2.5 today which is within goal.  Goal INR 2.0-3.0.  Continue current dosing-2.5 mg of Coumadin on Monday, Wednesday, Friday and Sunday & 5 mg of Coumadin on Tuesday, Thursday, Saturday.  Follow-up INR in 1 month.  Bleeding precautions discussed with patient at visit today.

## 2021-08-11 NOTE — Assessment & Plan Note (Addendum)
Chronic.  Controlled.  Continue with current medication regimen of Amlodipine 2.'5mg'$ .  Labs ordered today.  Refills sent today.  Return to clinic in 3 months for reevaluation.  Call sooner if concerns arise.

## 2021-08-11 NOTE — Assessment & Plan Note (Signed)
Labs ordered today.  Will make recommendations based on lab results. ?

## 2021-08-11 NOTE — Progress Notes (Signed)
Results discussed with patient during visit today. Continue with current dosage of coumadin.

## 2021-08-11 NOTE — Progress Notes (Signed)
BP 130/67   Pulse 70   Temp 98.4 F (36.9 C) (Oral)   Wt 177 lb 6.4 oz (80.5 kg)   SpO2 97%   BMI 24.40 kg/m    Subjective:    Patient ID: Thomas [name], male    DOB: Feb 12, 1946, 75 y.o.   MRN: [id number]  HPI: Thomas [name] is a 75 y.o. male  Chief Complaint  Patient presents with   Antithrombin 3 Deficiency   This patient is a 75 y.o. male who presents for coumadin management. His last INR was 2.5.  He is here for a 1 month visit to ensure he is back within range.  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  Patient denies any recent bleeding.  Denies night sweats, fever, fatigue, or weight loss.  COUMADIN CHECK INR Today: 2.5 Present Coumadin dose: 2.5 mg of Coumadin on Monday, Wednesday, Friday & Sunday 5 mg of Coumadin on Tuesday, Thursday, Saturday Goal: 2.0-3.0 Excessive bruising: no Nose bleeding: no Rectal bleeding: no Prolonged menstrual cycles: N/A Eating diet with consistent amounts of foods containing Vitamin K:yes Any recent antibiotic use? no Relevant past medical, surgical, family and social history reviewed and updated as indicated. Interim medical history since our last visit reviewed. Allergies and medications reviewed and updated.  HYPERTENSION Hypertension status: controlled  Satisfied with current treatment? no Duration of hypertension: years BP monitoring frequency:  not checking BP range:  BP medication side effects:  no Medication compliance: excellent compliance Previous BP meds:amlodipine Aspirin:  Coumadin Recurrent headaches: no Visual changes: no Palpitations: no Dyspnea: no Chest pain: no Lower extremity edema: no Dizzy/lightheaded: no   Review of Systems  Constitutional:  Negative for chills, diaphoresis, fatigue, fever and unexpected weight change.  Eyes:  Negative for visual disturbance.   Respiratory:  Negative for shortness of breath.   Cardiovascular:  Negative for chest pain and leg swelling.  Neurological:  Negative for light-headedness and headaches.   Per HPI unless specifically indicated above     Objective:    BP 130/67   Pulse 70   Temp 98.4 F (36.9 C) (Oral)   Wt 177 lb 6.4 oz (80.5 kg)   SpO2 97%   BMI 24.40 kg/m   Wt Readings from Last 3 Encounters:  08/11/21 177 lb 6.4 oz (80.5 kg)  07/09/21 176 lb 8 oz (80.1 kg)  06/05/21 172 lb (78 kg)    Physical Exam Vitals and nursing note reviewed.  Constitutional:      General: He is not in acute distress.    Appearance: Normal appearance. He is not ill-appearing, toxic-appearing or diaphoretic.  HENT:     Head: Normocephalic.     Right Ear: External ear normal.     Left Ear: External ear normal.     Nose: No rhinorrhea.     Mouth/Throat:     Mouth: Mucous membranes are moist.  Eyes:     General:        Right eye: No discharge.        Left eye: No discharge.     Extraocular Movements: Extraocular movements intact.     Conjunctiva/sclera: Conjunctivae normal.     Pupils: Pupils are equal, round, and reactive to light.  Cardiovascular:     Rate and Rhythm: Normal rate and regular rhythm.     Heart sounds: No murmur heard. Pulmonary:     Effort:  Pulmonary effort is normal. No respiratory distress.     Breath sounds: Normal breath sounds. No wheezing, rhonchi or rales.  Abdominal:     General: Abdomen is flat. Bowel sounds are normal.  Musculoskeletal:     Cervical back: Normal range of motion and neck supple.  Skin:    General: Skin is warm and dry.     Capillary Refill: Capillary refill takes [name] than 2 seconds.  Neurological:     General: No focal deficit present.     Mental Status: He is alert and oriented to person, place, and time.  Psychiatric:        Mood and Affect: Mood normal.        Behavior: Behavior normal.        Thought Content: Thought content normal.        Judgment:  Judgment normal.    Results for orders placed or performed in visit on 07/09/21  CoaguChek XS/INR Waived (STAT)  Result Value Ref Range   INR 2.2 (H) 0.9 - 1.1   Prothrombin Time 26.3 sec      Assessment & Plan:   Problem List Items Addressed This Visit       Cardiovascular and Mediastinum   Essential hypertension    Chronic.  Controlled.  Continue with current medication regimen of Amlodipine 2.$RemoveBeforeD'5mg'[id number]$ .  Labs ordered today.  Refills sent today.  Return to clinic in 3 months for reevaluation.  Call sooner if concerns arise.        Relevant Medications   amLODipine (NORVASC) 2.5 MG tablet   atorvastatin (LIPITOR) 10 MG tablet   warfarin (COUMADIN) 5 MG tablet   Other Relevant Orders   Comp Met (CMET)     Endocrine   Impaired fasting glucose    Labs ordered today. Will make recommendations based on lab results.       Relevant Orders   HgB A1c     Hematopoietic and Hemostatic   Antithrombin 3 deficiency (HCC) - Primary    Chronic,  2.5 today which is within goal.  Goal INR 2.0-3.0.  Continue current dosing-2.5 mg of Coumadin on Monday, Wednesday, Friday and Sunday & 5 mg of Coumadin on Tuesday, Thursday, Saturday.  Follow-up INR in 1 month.  Bleeding precautions discussed with patient at visit today.       Relevant Orders   CoaguChek XS/INR Waived     Other   H/O deep venous thrombosis    Chronic,  2.5 today which is within goal.  Goal INR 2.0-3.0.  Continue current dosing-2.5 mg of Coumadin on Monday, Wednesday, Friday and Sunday & 5 mg of Coumadin on Tuesday, Thursday, Saturday.  Follow-up INR in 1 month.  Bleeding precautions discussed with patient at visit today.       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  Goal INR 2.0-3.0.  Continue current dosing-2.5 mg of Coumadin on Monday, Wednesday, Friday and Sunday & 5 mg of Coumadin on Tuesday, Thursday, Saturday.  Follow-up INR in 1 month.   Bleeding precautions discussed with patient at visit today.       Relevant Medications   warfarin (COUMADIN) 5 MG tablet   Encounter for current long-term use of anticoagulants    Chronic,  2.5 today which is within goal.  Goal INR 2.0-3.0.  Continue current dosing-2.5 mg of Coumadin on Monday, Wednesday, Friday and Sunday & 5 mg of Coumadin  on Tuesday, Thursday, Saturday.  Follow-up INR in 1 month.  Bleeding precautions discussed with patient at visit today.       Relevant Orders   CoaguChek XS/INR Waived     Follow up plan: Return in about 1 month (around 09/10/2021) for Coag check.

## 2021-08-12 LAB — COMPREHENSIVE METABOLIC PANEL
ALT: 6 IU/L (ref 0–44)
AST: 13 IU/L (ref 0–40)
Albumin/Globulin Ratio: 1.4 (ref 1.2–2.2)
Albumin: 4.4 g/dL (ref 3.7–4.7)
Alkaline Phosphatase: 71 IU/L (ref 44–121)
BUN/Creatinine Ratio: 13 (ref 10–24)
BUN: 13 mg/dL (ref 8–27)
Bilirubin Total: 0.3 mg/dL (ref 0.0–1.2)
CO2: 23 mmol/L (ref 20–29)
Calcium: 9 mg/dL (ref 8.6–10.2)
Chloride: 103 mmol/L (ref 96–106)
Creatinine, Ser: 1.01 mg/dL (ref 0.76–1.27)
Globulin, Total: 3.2 g/dL (ref 1.5–4.5)
Glucose: 91 mg/dL (ref 65–99)
Potassium: 4 mmol/L (ref 3.5–5.2)
Sodium: 140 mmol/L (ref 134–144)
Total Protein: 7.6 g/dL (ref 6.0–8.5)
eGFR: 78 mL/min/{1.73_m2} (ref >59)

## 2021-08-12 LAB — HEMOGLOBIN A1C
Est. average glucose Bld gHb Est-mCnc: 126 mg/dL
Hgb A1c MFr Bld: 6 % — ABNORMAL HIGH (ref 4.8–5.6)

## 2021-08-12 NOTE — Progress Notes (Signed)
Please let Thomas [name] know that his lab work looks.  Liver kidneys and electrolytes are normal.  His A1c did bump up a little bit to 6.0.  Recommend he decrease carbohydrates in his diet.

## 2021-09-10 ENCOUNTER — Encounter: Payer: Self-pay | Admitting: Nurse Practitioner

## 2021-09-10 ENCOUNTER — Ambulatory Visit (INDEPENDENT_AMBULATORY_CARE_PROVIDER_SITE_OTHER): Payer: Medicare Other | Admitting: Nurse Practitioner

## 2021-09-10 ENCOUNTER — Telehealth: Payer: Self-pay | Admitting: Nurse Practitioner

## 2021-09-10 ENCOUNTER — Other Ambulatory Visit: Payer: Self-pay

## 2021-09-10 VITALS — BP 143/76 | HR 60 | Ht 71.5 in | Wt 179.0 lb

## 2021-09-10 DIAGNOSIS — Z7901 Long term (current) use of anticoagulants: Secondary | ICD-10-CM | POA: Diagnosis not present

## 2021-09-10 DIAGNOSIS — D6859 Other primary thrombophilia: Secondary | ICD-10-CM | POA: Diagnosis not present

## 2021-09-10 LAB — COAGUCHEK XS/INR WAIVED
INR: 1.7 — ABNORMAL HIGH (ref 0.9–1.1)
Prothrombin Time: 20.7 s

## 2021-09-10 NOTE — Progress Notes (Signed)
Results discussed with patient during visit.  Medication adjusted accordingly.

## 2021-09-10 NOTE — Assessment & Plan Note (Signed)
Chronic,  1.7 today which is within goal.  Goal INR 2.0-3.0.  Change dosing-5 mg of Coumadin on Monday, Wednesday, Friday and Sunday & 2.5 mg of Coumadin on Tuesday, Thursday, Saturday.  Follow-up INR in 2 weeks.  Bleeding precautions discussed with patient at visit today.

## 2021-09-10 NOTE — Telephone Encounter (Signed)
Pt called in for assistance. Pt says that his INR was 1.7 today at his visit. Pt says that he was told by provider that she was going to make a change to his warfarin (COUMADIN) 5 MG tablet Rx. Pt says on his AVS it is still reflecting the same thing.   Pt would like further assistance.       Pharmacy:  [id number] - [place], [address] Phone:  [phone]  Fax:  [phone]      Phone # [phone]

## 2021-09-10 NOTE — Progress Notes (Signed)
 BP (!) 143/76   Pulse 60   Ht 5' 11.5" (1.816 m)   Wt 179 lb (81.2 kg)   BMI 24.62 kg/m    Subjective:    Patient ID: Thomas [name], male    DOB: 01/31/1946, 75 y.o.   MRN: [id number]  HPI: Thomas [name] is a 75 y.o. male  Chief Complaint  Patient presents with   antithrombin   This patient is a 74 y.o. male who presents for coumadin management. His last INR was 1.7.  He is here for a 1 month visit to ensure he is back within range.  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  Patient denies any recent bleeding.  Denies night sweats, fever, fatigue, or weight loss.  COUMADIN CHECK INR Today: 1.7 Present Coumadin dose: 2.5 mg of Coumadin on Monday, Wednesday, Friday & Sunday 5 mg of Coumadin on Tuesday, Thursday, Saturday Goal: 2.0-3.0 Excessive bruising: no Nose bleeding: no Rectal bleeding: no Prolonged menstrual cycles: N/A Eating diet with consistent amounts of foods containing Vitamin K:yes Any recent antibiotic use? no Relevant past medical, surgical, family and social history reviewed and updated as indicated. Interim medical history since our last visit reviewed. Allergies and medications reviewed and updated.    Review of Systems  Constitutional:  Negative for chills, diaphoresis, fatigue, fever and unexpected weight change.  Eyes:  Negative for visual disturbance.  Respiratory:  Negative for shortness of breath.   Cardiovascular:  Negative for chest pain and leg swelling.  Neurological:  Negative for light-headedness and headaches.   Per HPI unless specifically indicated above     Objective:    BP (!) 143/76   Pulse 60   Ht 5' 11.5" (1.816 m)   Wt 179 lb (81.2 kg)   BMI 24.62 kg/m   Wt Readings from Last 3 Encounters:  09/10/21 179 lb (81.2 kg)  08/11/21 177 lb 6.4 oz (80.5 kg)  07/09/21 176 lb 8 oz (80.1 kg)    Physical  Exam Vitals and nursing note reviewed.  Constitutional:      General: He is not in acute distress.    Appearance: Normal appearance. He is not ill-appearing, toxic-appearing or diaphoretic.  HENT:     Head: Normocephalic.     Right Ear: External ear normal.     Left Ear: External ear normal.     Nose: No rhinorrhea.     Mouth/Throat:     Mouth: Mucous membranes are moist.  Eyes:     General:        Right eye: No discharge.        Left eye: No discharge.     Extraocular Movements: Extraocular movements intact.     Conjunctiva/sclera: Conjunctivae normal.     Pupils: Pupils are equal, round, and reactive to light.  Cardiovascular:     Rate and Rhythm: Normal rate and regular rhythm.     Heart sounds: No murmur heard. Pulmonary:     Effort: Pulmonary effort is normal. No respiratory distress.     Breath sounds: Normal breath sounds. No wheezing, rhonchi or rales.  Abdominal:     General: Abdomen is flat. Bowel sounds are normal.  Musculoskeletal:     Cervical back: Normal range of motion and neck supple.  Skin:    General: Skin is warm and dry.     Capillary Refill: Capillary refill takes less than   2 seconds.  Neurological:     General: No focal deficit present.     Mental Status: He is alert and oriented to person, place, and time.  Psychiatric:        Mood and Affect: Mood normal.        Behavior: Behavior normal.        Thought Content: Thought content normal.        Judgment: Judgment normal.    Results for orders placed or performed in visit on 08/11/21  CoaguChek XS/INR Waived  Result Value Ref Range   INR 2.5 (H) 0.9 - 1.1   Prothrombin Time 29.8 sec  Comp Met (CMET)  Result Value Ref Range   Glucose 91 65 - 99 mg/dL   BUN 13 8 - 27 mg/dL   Creatinine, Ser 1.01 0.76 - 1.27 mg/dL   eGFR 78 >59 mL/min/1.73   BUN/Creatinine Ratio 13 10 - 24   Sodium 140 134 - 144 mmol/L   Potassium 4.0 3.5 - 5.2 mmol/L   Chloride 103 96 - 106 mmol/L   CO2 23 20 - 29 mmol/L    Calcium 9.0 8.6 - 10.2 mg/dL   Total Protein 7.6 6.0 - 8.5 g/dL   Albumin 4.4 3.7 - 4.7 g/dL   Globulin, Total 3.2 1.5 - 4.5 g/dL   Albumin/Globulin Ratio 1.4 1.2 - 2.2   Bilirubin Total 0.3 0.0 - 1.2 mg/dL   Alkaline Phosphatase 71 44 - 121 IU/L   AST 13 0 - 40 IU/L   ALT 6 0 - 44 IU/L  HgB A1c  Result Value Ref Range   Hgb A1c MFr Bld 6.0 (H) 4.8 - 5.6 %   Est. average glucose Bld gHb Est-mCnc 126 mg/dL      Assessment & Plan:   Problem List Items Addressed This Visit       Hematopoietic and Hemostatic   Antithrombin 3 deficiency (HCC)    Chronic,  1.7 today which is within goal.  Goal INR 2.0-3.0.  Change dosing-5 mg of Coumadin on Monday, Wednesday, Friday and Sunday & 2.5 mg of Coumadin on Tuesday, Thursday, Saturday.  Follow-up INR in 2 weeks.  Bleeding precautions discussed with patient at visit today.       Relevant Orders   CoaguChek XS/INR Waived     Other   Encounter for current long-term use of anticoagulants - Primary    Chronic,  1.7 today which is within goal.  Goal INR 2.0-3.0.  Change dosing-5 mg of Coumadin on Monday, Wednesday, Friday and Sunday & 2.5 mg of Coumadin on Tuesday, Thursday, Saturday.  Follow-up INR in 2 weeks.  Bleeding precautions discussed with patient at visit today.       Relevant Orders   CoaguChek XS/INR Waived     Follow up plan: Return in about 2 weeks (around 09/24/2021) for Coag check.          

## 2021-09-10 NOTE — Patient Instructions (Signed)
5mg  of Coumadin on Monday, Wednesday, Friday & Sunday 2.5 mg of Coumadin on Tues, Thurs, Sat

## 2021-09-11 NOTE — Telephone Encounter (Signed)
Patient verbalized that he read the paper wrong and did not realized that the dosage change. He states that he only seen the days. Patient apologizes for the confusion. Patient states that he understands how he is supposed to take the medication with the clear instructions. Patient verbalized understanding and has no further questions at this time.

## 2021-09-11 NOTE — Telephone Encounter (Signed)
5 mg of Coumadin on Monday, Wednesday, Friday and Sunday & 2.5 mg of Coumadin on Tuesday, Thursday, Saturday.

## 2021-09-25 ENCOUNTER — Other Ambulatory Visit: Payer: Self-pay

## 2021-09-25 ENCOUNTER — Ambulatory Visit (INDEPENDENT_AMBULATORY_CARE_PROVIDER_SITE_OTHER): Payer: Medicare Other | Admitting: Nurse Practitioner

## 2021-09-25 ENCOUNTER — Encounter: Payer: Self-pay | Admitting: Nurse Practitioner

## 2021-09-25 VITALS — BP 125/75 | HR 79 | Ht 71.5 in | Wt 179.0 lb

## 2021-09-25 DIAGNOSIS — D6859 Other primary thrombophilia: Secondary | ICD-10-CM | POA: Diagnosis not present

## 2021-09-25 DIAGNOSIS — Z7901 Long term (current) use of anticoagulants: Secondary | ICD-10-CM

## 2021-09-25 LAB — COAGUCHEK XS/INR WAIVED
INR: 2.4 — ABNORMAL HIGH (ref 0.9–1.1)
Prothrombin Time: 28.4 s

## 2021-09-25 NOTE — Assessment & Plan Note (Signed)
Chronic,  2.4 today which is within goal.  Goal INR 2.0-3.0.  Change dosing-5 mg of Coumadin on Monday, Wednesday, Friday and Sunday & 2.5 mg of Coumadin on Tuesday, Thursday, Saturday.  Follow-up INR in 1 month.  Bleeding precautions discussed with patient at visit today.

## 2021-09-25 NOTE — Progress Notes (Signed)
BP 125/75   Pulse 79   Ht 5' 11.5" (1.816 m)   Wt 179 lb (81.2 kg)   SpO2 98%   BMI 24.62 kg/m    Subjective:    Patient ID: Thomas [name], male    DOB: 1946/02/02, 75 y.o.   MRN: [id number]  HPI: Thomas [name] is a 75 y.o. male  Chief Complaint  Patient presents with   Coagulation Disorder   This patient is a 75 y.o. male who presents for coumadin management. His last INR was 1.7.  He is here for a 1 month visit to ensure he is back within range.  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  Patient denies any recent bleeding.  Denies night sweats, fever, fatigue, or weight loss.  COUMADIN CHECK INR Today: 2.4 Present Coumadin dose: 2.5 mg of Coumadin on Monday, Wednesday, Friday & Sunday 5 mg of Coumadin on Tuesday, Thursday, Saturday Goal: 2.0-3.0 Excessive bruising: no Nose bleeding: no Rectal bleeding: no Prolonged menstrual cycles: N/A Eating diet with consistent amounts of foods containing Vitamin K:yes Any recent antibiotic use? no Relevant past medical, surgical, family and social history reviewed and updated as indicated. Interim medical history since our last visit reviewed. Allergies and medications reviewed and updated.    Review of Systems  Constitutional:  Negative for chills, diaphoresis, fatigue, fever and unexpected weight change.  Eyes:  Negative for visual disturbance.  Respiratory:  Negative for shortness of breath.   Cardiovascular:  Negative for chest pain and leg swelling.  Neurological:  Negative for light-headedness and headaches.   Per HPI unless specifically indicated above     Objective:    BP 125/75   Pulse 79   Ht 5' 11.5" (1.816 m)   Wt 179 lb (81.2 kg)   SpO2 98%   BMI 24.62 kg/m   Wt Readings from Last 3 Encounters:  09/25/21 179 lb (81.2 kg)  09/10/21 179 lb (81.2 kg)  08/11/21 177 lb 6.4 oz (80.5  kg)    Physical Exam Vitals and nursing note reviewed.  Constitutional:      General: He is not in acute distress.    Appearance: Normal appearance. He is not ill-appearing, toxic-appearing or diaphoretic.  HENT:     Head: Normocephalic.     Right Ear: External ear normal.     Left Ear: External ear normal.     Nose: No rhinorrhea.     Mouth/Throat:     Mouth: Mucous membranes are moist.  Eyes:     General:        Right eye: No discharge.        Left eye: No discharge.     Extraocular Movements: Extraocular movements intact.     Conjunctiva/sclera: Conjunctivae normal.     Pupils: Pupils are equal, round, and reactive to light.  Cardiovascular:     Rate and Rhythm: Normal rate and regular rhythm.     Heart sounds: No murmur heard. Pulmonary:     Effort: Pulmonary effort is normal. No respiratory distress.     Breath sounds: Normal breath sounds. No wheezing, rhonchi or rales.  Abdominal:     General: Abdomen is flat. Bowel sounds are normal.  Musculoskeletal:     Cervical back: Normal range of motion and neck supple.  Skin:    General: Skin is warm and dry.     Capillary Refill:  Capillary refill takes [name] than 2 seconds.  Neurological:     General: No focal deficit present.     Mental Status: He is alert and oriented to person, place, and time.  Psychiatric:        Mood and Affect: Mood normal.        Behavior: Behavior normal.        Thought Content: Thought content normal.        Judgment: Judgment normal.    Results for orders placed or performed in visit on 09/10/21  CoaguChek XS/INR Waived  Result Value Ref Range   INR 1.7 (H) 0.9 - 1.1   Prothrombin Time 20.7 sec      Assessment & Plan:   Problem List Items Addressed This Visit       Hematopoietic and Hemostatic   Antithrombin 3 deficiency (HCC) - Primary    Chronic,  2.4 today which is within goal.  Goal INR 2.0-3.0.  Change dosing-5 mg of Coumadin on Monday, Wednesday, Friday and Sunday & 2.5 mg of  Coumadin on Tuesday, Thursday, Saturday.  Follow-up INR in 1 month.  Bleeding precautions discussed with patient at visit today.       Relevant Orders   CoaguChek XS/INR Waived     Other   Encounter for current long-term use of anticoagulants    Chronic,  2.4 today which is within goal.  Goal INR 2.0-3.0.  Change dosing-5 mg of Coumadin on Monday, Wednesday, Friday and Sunday & 2.5 mg of Coumadin on Tuesday, Thursday, Saturday.  Follow-up INR in 1 month.  Bleeding precautions discussed with patient at visit today.       Relevant Orders   CoaguChek XS/INR Waived     Follow up plan: Return in about 1 month (around 10/26/2021) for Coag check.

## 2021-09-26 NOTE — Progress Notes (Signed)
Results discussed with patient during visit.  Continue with current regimen. Follow up in 1 month.

## 2021-10-30 ENCOUNTER — Encounter: Payer: Self-pay | Admitting: Nurse Practitioner

## 2021-10-30 ENCOUNTER — Ambulatory Visit (INDEPENDENT_AMBULATORY_CARE_PROVIDER_SITE_OTHER): Payer: Medicare Other | Admitting: Nurse Practitioner

## 2021-10-30 ENCOUNTER — Other Ambulatory Visit: Payer: Self-pay

## 2021-10-30 VITALS — BP 137/72 | HR 67 | Temp 97.4°F | Ht 72.0 in | Wt 174.0 lb

## 2021-10-30 DIAGNOSIS — Z7901 Long term (current) use of anticoagulants: Secondary | ICD-10-CM

## 2021-10-30 DIAGNOSIS — D6859 Other primary thrombophilia: Secondary | ICD-10-CM | POA: Diagnosis not present

## 2021-10-30 DIAGNOSIS — Z86718 Personal history of other venous thrombosis and embolism: Secondary | ICD-10-CM | POA: Diagnosis not present

## 2021-10-30 LAB — COAGUCHEK XS/INR WAIVED
INR: 2.3 — ABNORMAL HIGH (ref 0.9–1.1)
Prothrombin Time: 27.6 s

## 2021-10-30 NOTE — Assessment & Plan Note (Signed)
Chronic,  2.3 today which is within goal.  Goal INR 2.0-3.0.  Change dosing-5 mg of Coumadin on Monday, Wednesday, Friday and Sunday & 2.5 mg of Coumadin on Tuesday, Thursday, Saturday.  Follow-up INR in 1 month.  Bleeding precautions discussed with patient at visit today.

## 2021-10-30 NOTE — Progress Notes (Signed)
Results discussed with patient during visit. Continue with current dose of coumadin.

## 2021-10-30 NOTE — Progress Notes (Signed)
BP 137/72   Pulse 67   Temp (!) 97.4 F (36.3 C) (Oral)   Ht 6' (1.829 m)   Wt 174 lb (78.9 kg)   SpO2 97%   BMI 23.60 kg/m    Subjective:    Patient ID: Thomas [name], male    DOB: 03/28/1946, 75 y.o.   MRN: [id number]  HPI: Thomas [name] is a 75 y.o. male  Chief Complaint  Patient presents with   antithrombin deficiency    My has no concerns or questions   This patient is a 75 y.o. male who presents for coumadin management. INR today was 2.4.  He is here for a 1 month visit to ensure he is back within range.  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  Patient denies any recent bleeding.  Denies night sweats, fever, fatigue, or weight loss.  COUMADIN CHECK INR Today: 2.3 Present Coumadin dose: 2.5 mg of Coumadin on Monday, Wednesday, Friday & Sunday 5 mg of Coumadin on Tuesday, Thursday, Saturday Goal: 2.0-3.0 Excessive bruising: no Nose bleeding: no Rectal bleeding: no Prolonged menstrual cycles: N/A Eating diet with consistent amounts of foods containing Vitamin K:yes Any recent antibiotic use? no Relevant past medical, surgical, family and social history reviewed and updated as indicated. Interim medical history since our last visit reviewed. Allergies and medications reviewed and updated.    Review of Systems  Constitutional:  Negative for chills, diaphoresis, fatigue, fever and unexpected weight change.  Eyes:  Negative for visual disturbance.  Respiratory:  Negative for shortness of breath.   Cardiovascular:  Negative for chest pain and leg swelling.  Neurological:  Negative for light-headedness and headaches.   Per HPI unless specifically indicated above     Objective:    BP 137/72   Pulse 67   Temp (!) 97.4 F (36.3 C) (Oral)   Ht 6' (1.829 m)   Wt 174 lb (78.9 kg)   SpO2 97%   BMI 23.60 kg/m   Wt Readings from Last 3  Encounters:  10/30/21 174 lb (78.9 kg)  09/25/21 179 lb (81.2 kg)  09/10/21 179 lb (81.2 kg)    Physical Exam Vitals and nursing note reviewed.  Constitutional:      General: He is not in acute distress.    Appearance: Normal appearance. He is not ill-appearing, toxic-appearing or diaphoretic.  HENT:     Head: Normocephalic.     Right Ear: External ear normal.     Left Ear: External ear normal.     Nose: No rhinorrhea.     Mouth/Throat:     Mouth: Mucous membranes are moist.  Eyes:     General:        Right eye: No discharge.        Left eye: No discharge.     Extraocular Movements: Extraocular movements intact.     Conjunctiva/sclera: Conjunctivae normal.     Pupils: Pupils are equal, round, and reactive to light.  Cardiovascular:     Rate and Rhythm: Normal rate and regular rhythm.     Heart sounds: No murmur heard. Pulmonary:     Effort: Pulmonary effort is normal. No respiratory distress.     Breath sounds: Normal breath sounds. No wheezing, rhonchi or rales.  Abdominal:     General: Abdomen is flat. Bowel sounds are normal.  Musculoskeletal:     Cervical back: Normal range  of motion and neck supple.  Skin:    General: Skin is warm and dry.     Capillary Refill: Capillary refill takes [name] than 2 seconds.  Neurological:     General: No focal deficit present.     Mental Status: He is alert and oriented to person, place, and time.  Psychiatric:        Mood and Affect: Mood normal.        Behavior: Behavior normal.        Thought Content: Thought content normal.        Judgment: Judgment normal.    Results for orders placed or performed in visit on 09/25/21  CoaguChek XS/INR Waived  Result Value Ref Range   INR 2.4 (H) 0.9 - 1.1   Prothrombin Time 28.4 sec      Assessment & Plan:   Problem List Items Addressed This Visit       Hematopoietic and Hemostatic   Antithrombin 3 deficiency (HCC) - Primary    Chronic,  2.3 today which is within goal.  Goal INR  2.0-3.0.  Change dosing-5 mg of Coumadin on Monday, Wednesday, Friday and Sunday & 2.5 mg of Coumadin on Tuesday, Thursday, Saturday.  Follow-up INR in 1 month.  Bleeding precautions discussed with patient at visit today.       Relevant Orders   CoaguChek XS/INR Waived     Other   Encounter for current long-term use of anticoagulants    Chronic,  2.3 today which is within goal.  Goal INR 2.0-3.0.  Change dosing-5 mg of Coumadin on Monday, Wednesday, Friday and Sunday & 2.5 mg of Coumadin on Tuesday, Thursday, Saturday.  Follow-up INR in 1 month.  Bleeding precautions discussed with patient at visit today.       Relevant Orders   CoaguChek XS/INR Waived     Follow up plan: Return in about 1 month (around 11/30/2021) for Coag check.

## 2021-12-01 ENCOUNTER — Ambulatory Visit: Payer: Medicare Other | Admitting: Nurse Practitioner

## 2021-12-03 ENCOUNTER — Other Ambulatory Visit: Payer: Self-pay

## 2021-12-03 ENCOUNTER — Encounter: Payer: Self-pay | Admitting: Nurse Practitioner

## 2021-12-03 ENCOUNTER — Ambulatory Visit (INDEPENDENT_AMBULATORY_CARE_PROVIDER_SITE_OTHER): Payer: Medicare Other | Admitting: Nurse Practitioner

## 2021-12-03 VITALS — BP 128/67 | HR 71 | Temp 98.4°F | Wt 174.2 lb

## 2021-12-03 DIAGNOSIS — R7301 Impaired fasting glucose: Secondary | ICD-10-CM | POA: Diagnosis not present

## 2021-12-03 DIAGNOSIS — Z85828 Personal history of other malignant neoplasm of skin: Secondary | ICD-10-CM | POA: Diagnosis not present

## 2021-12-03 DIAGNOSIS — Z9889 Other specified postprocedural states: Secondary | ICD-10-CM | POA: Diagnosis not present

## 2021-12-03 DIAGNOSIS — E785 Hyperlipidemia, unspecified: Secondary | ICD-10-CM

## 2021-12-03 DIAGNOSIS — Z7901 Long term (current) use of anticoagulants: Secondary | ICD-10-CM | POA: Diagnosis not present

## 2021-12-03 DIAGNOSIS — I10 Essential (primary) hypertension: Secondary | ICD-10-CM

## 2021-12-03 DIAGNOSIS — D6859 Other primary thrombophilia: Secondary | ICD-10-CM | POA: Diagnosis not present

## 2021-12-03 LAB — COAGUCHEK XS/INR WAIVED
INR: 2.5 — ABNORMAL HIGH (ref 0.9–1.1)
Prothrombin Time: 29.6 s

## 2021-12-03 NOTE — Assessment & Plan Note (Signed)
Chronic.  Controlled.  Continue with current medication regimen on Atorvastatin 10mg.  Labs ordered today.  Return to clinic in 3 months for reevaluation.  Call sooner if concerns arise.   

## 2021-12-03 NOTE — Assessment & Plan Note (Signed)
Ongoing open wound on left ear. Patient had a [place] removed by Dermatology. Declines to go back to Dermatology for further evaluation and treatment.

## 2021-12-03 NOTE — Progress Notes (Signed)
Results discussed with patient during visit.

## 2021-12-03 NOTE — Assessment & Plan Note (Signed)
Chronic,  2.5 today which is within goal.  Goal INR 2.0-3.0.  Change dosing-5 mg of Coumadin on Monday, Wednesday, Friday and Sunday & 2.5 mg of Coumadin on Tuesday, Thursday, Saturday.  Follow-up INR in 1 month.  Bleeding precautions discussed with patient at visit today.

## 2021-12-03 NOTE — Assessment & Plan Note (Signed)
Labs ordered today.  Will make recommendations based on lab results. ?

## 2021-12-03 NOTE — Progress Notes (Signed)
BP 128/67    Pulse 71    Temp 98.4 F (36.9 C) (Oral)    Wt 174 lb 3.2 oz (79 kg)    SpO2 98%    BMI 23.63 kg/m    Subjective:    Patient ID: Thomas [name], male    DOB: 1946-02-12, 76 y.o.   MRN: [id number]  HPI: Thomas [name] is a 76 y.o. male  Chief Complaint  Patient presents with   Hyperlipidemia   Hypertension   Antithrombin 3 Deficiency    This patient is a 76 y.o. male who presents for coumadin management. His last INR was 2.5.  He is here for a 1 month visit to ensure he is back within range.  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  Patient denies any recent bleeding.  Denies night sweats, fever, fatigue, or weight loss.  COUMADIN CHECK INR Today: 2.5 Present Coumadin dose: 2.5 mg of Coumadin on Monday, Wednesday, Friday & Sunday 5 mg of Coumadin on Tuesday, Thursday, Saturday Goal: 2.0-3.0 Excessive bruising: no Nose bleeding: no Rectal bleeding: no Prolonged menstrual cycles: N/A Eating diet with consistent amounts of foods containing Vitamin K:yes Any recent antibiotic use? no Relevant past medical, surgical, family and social history reviewed and updated as indicated. Interim medical history since our last visit reviewed. Allergies and medications reviewed and updated.  HYPERTENSION Hypertension status: controlled  Satisfied with current treatment? no Duration of hypertension: years BP monitoring frequency:  not checking BP range:  BP medication side effects:  no Medication compliance: excellent compliance Previous BP meds:amlodipine Aspirin:  Coumadin Recurrent headaches: no Visual changes: no Palpitations: no Dyspnea: no Chest pain: no Lower extremity edema: no Dizzy/lightheaded: no   Review of Systems  Constitutional:  Negative for chills, diaphoresis, fatigue, fever and unexpected weight change.  Eyes:  Negative for  visual disturbance.  Respiratory:  Negative for shortness of breath.   Cardiovascular:  Negative for chest pain and leg swelling.  Neurological:  Negative for light-headedness and headaches.   Per HPI unless specifically indicated above     Objective:    BP 128/67    Pulse 71    Temp 98.4 F (36.9 C) (Oral)    Wt 174 lb 3.2 oz (79 kg)    SpO2 98%    BMI 23.63 kg/m   Wt Readings from Last 3 Encounters:  12/03/21 174 lb 3.2 oz (79 kg)  10/30/21 174 lb (78.9 kg)  09/25/21 179 lb (81.2 kg)    Physical Exam Vitals and nursing note reviewed.  Constitutional:      General: He is not in acute distress.    Appearance: Normal appearance. He is not ill-appearing, toxic-appearing or diaphoretic.  HENT:     Head: Normocephalic.      Right Ear: External ear normal.     Left Ear: External ear normal.     Nose: No rhinorrhea.     Mouth/Throat:     Mouth: Mucous membranes are moist.  Eyes:     General:        Right eye: No discharge.        Left eye: No discharge.     Extraocular Movements: Extraocular movements intact.     Conjunctiva/sclera: Conjunctivae normal.     Pupils: Pupils are equal, round, and reactive to light.  Cardiovascular:     Rate and Rhythm: Normal rate and regular  rhythm.     Heart sounds: No murmur heard. Pulmonary:     Effort: Pulmonary effort is normal. No respiratory distress.     Breath sounds: Normal breath sounds. No wheezing, rhonchi or rales.  Abdominal:     General: Abdomen is flat. Bowel sounds are normal.  Musculoskeletal:     Cervical back: Normal range of motion and neck supple.  Skin:    General: Skin is warm and dry.     Capillary Refill: Capillary refill takes [name] than 2 seconds.  Neurological:     General: No focal deficit present.     Mental Status: He is alert and oriented to person, place, and time.  Psychiatric:        Mood and Affect: Mood normal.        Behavior: Behavior normal.        Thought Content: Thought content normal.         Judgment: Judgment normal.    Results for orders placed or performed in visit on 10/30/21  CoaguChek XS/INR Waived  Result Value Ref Range   INR 2.3 (H) 0.9 - 1.1   Prothrombin Time 27.6 sec      Assessment & Plan:   Problem List Items Addressed This Visit       Cardiovascular and Mediastinum   Essential hypertension - Primary    Chronic.  Controlled.  Continue with current medication regimen of Amlodipine 2.16m.  Labs ordered today.  Return to clinic in 3 months for reevaluation.  Call sooner if concerns arise.        Relevant Orders   Comp Met (CMET)   Lipid Profile     Endocrine   Impaired fasting glucose    Labs ordered today. Will make recommendations based on lab results.      Relevant Orders   HgB A1c     Hematopoietic and Hemostatic   Antithrombin 3 deficiency (HCC)    Chronic,  2.5 today which is within goal.  Goal INR 2.0-3.0.  Change dosing-5 mg of Coumadin on Monday, Wednesday, Friday and Sunday & 2.5 mg of Coumadin on Tuesday, Thursday, Saturday.  Follow-up INR in 1 month.  Bleeding precautions discussed with patient at visit today.         Other   Hyperlipidemia    Chronic.  Controlled.  Continue with current medication regimen on Atorvastatin 154m  Labs ordered today.  Return to clinic in 3 months for reevaluation.  Call sooner if concerns arise.        Relevant Orders   Lipid Profile   History of basal cell carcinoma (BCC) excision    Ongoing open wound on left ear. Patient [facility]. Declines to go back to Dermatology for further evaluation and treatment.      Encounter for current long-term use of anticoagulants    Chronic,  2.5 today which is within goal.  Goal INR 2.0-3.0.  Change dosing-5 mg of Coumadin on Monday, Wednesday, Friday and Sunday & 2.5 mg of Coumadin on Tuesday, Thursday, Saturday.  Follow-up INR in 1 month.  Bleeding precautions discussed with patient at visit today.       Relevant Orders   CoaguChek  XS/INR Waived (STAT)     Follow up plan: Return in about 1 month (around 01/03/2022) for Coag check.

## 2021-12-03 NOTE — Assessment & Plan Note (Signed)
Chronic.  Controlled.  Continue with current medication regimen of Amlodipine 2.5mg .  Labs ordered today.  Return to clinic in 3 months for reevaluation.  Call sooner if concerns arise.

## 2021-12-04 LAB — LIPID PANEL
Chol/HDL Ratio: 2.5 ratio (ref 0.0–5.0)
Cholesterol, Total: 106 mg/dL (ref 100–199)
HDL: 42 mg/dL (ref >39)
LDL Chol Calc (NIH): 53 mg/dL (ref 0–99)
Triglycerides: 46 mg/dL (ref 0–149)
VLDL Cholesterol Cal: 11 mg/dL (ref 5–40)

## 2021-12-04 LAB — COMPREHENSIVE METABOLIC PANEL
ALT: 6 IU/L (ref 0–44)
AST: 15 IU/L (ref 0–40)
Albumin/Globulin Ratio: 1.6 (ref 1.2–2.2)
Albumin: 4.4 g/dL (ref 3.7–4.7)
Alkaline Phosphatase: 63 IU/L (ref 44–121)
BUN/Creatinine Ratio: 14 (ref 10–24)
BUN: 14 mg/dL (ref 8–27)
Bilirubin Total: 0.3 mg/dL (ref 0.0–1.2)
CO2: 24 mmol/L (ref 20–29)
Calcium: 9.1 mg/dL (ref 8.6–10.2)
Chloride: 106 mmol/L (ref 96–106)
Creatinine, Ser: 0.97 mg/dL (ref 0.76–1.27)
Globulin, Total: 2.7 g/dL (ref 1.5–4.5)
Glucose: 85 mg/dL (ref 70–99)
Potassium: 4.7 mmol/L (ref 3.5–5.2)
Sodium: 144 mmol/L (ref 134–144)
Total Protein: 7.1 g/dL (ref 6.0–8.5)
eGFR: 81 mL/min/{1.73_m2} (ref >59)

## 2021-12-04 LAB — HEMOGLOBIN A1C
Est. average glucose Bld gHb Est-mCnc: 117 mg/dL
Hgb A1c MFr Bld: 5.7 % — ABNORMAL HIGH (ref 4.8–5.6)

## 2021-12-04 NOTE — Progress Notes (Signed)
Please let Mr. Dorner know that his lab work looks great.  No concerns at this time. A1c remains well controlled at 5.7.  Please lt me know if he has any questions.

## 2022-01-05 ENCOUNTER — Other Ambulatory Visit: Payer: Self-pay

## 2022-01-05 ENCOUNTER — Encounter: Payer: Self-pay | Admitting: Nurse Practitioner

## 2022-01-05 ENCOUNTER — Ambulatory Visit (INDEPENDENT_AMBULATORY_CARE_PROVIDER_SITE_OTHER): Payer: Medicare Other | Admitting: Nurse Practitioner

## 2022-01-05 VITALS — BP 112/64 | HR 70 | Temp 98.1°F

## 2022-01-05 DIAGNOSIS — Z7901 Long term (current) use of anticoagulants: Secondary | ICD-10-CM

## 2022-01-05 DIAGNOSIS — D6859 Other primary thrombophilia: Secondary | ICD-10-CM

## 2022-01-05 LAB — COAGUCHEK XS/INR WAIVED
INR: 2 — ABNORMAL HIGH (ref 0.9–1.1)
Prothrombin Time: 24.5 s

## 2022-01-05 NOTE — Progress Notes (Signed)
g  BP 112/64    Pulse 70    Temp 98.1 F (36.7 C) (Oral)    SpO2 96%    Subjective:    Patient ID: Thomas [name], male    DOB: 02-20-1946, 76 y.o.   MRN: [id number]  HPI: Thomas [name] is a 76 y.o. male  Chief Complaint  Patient presents with   Antithrombin 3 Deficiency   This patient is a 76 y.o. male who presents for coumadin management. His last INR was 2.5.  He is here for a 1 month visit to ensure he is back within range.  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  Patient denies any recent bleeding.  Denies night sweats, fever, fatigue, or weight loss.  COUMADIN CHECK INR Today: 2.0 Present Coumadin dose: 5 mg of Coumadin on Monday, Wednesday, Friday & Sunday 2.5 mg of Coumadin on Tuesday, Thursday, Saturday Goal: 2.0-3.0 Excessive bruising: no Nose bleeding: no Rectal bleeding: no Prolonged menstrual cycles: N/A Eating diet with consistent amounts of foods containing Vitamin K:yes Any recent antibiotic use? no Relevant past medical, surgical, family and social history reviewed and updated as indicated. Interim medical history since our last visit reviewed. Allergies and medications reviewed and updated.    Review of Systems  Constitutional:  Negative for chills, diaphoresis, fatigue, fever and unexpected weight change.  Eyes:  Negative for visual disturbance.  Respiratory:  Negative for shortness of breath.   Cardiovascular:  Negative for chest pain and leg swelling.  Neurological:  Negative for light-headedness and headaches.   Per HPI unless specifically indicated above     Objective:    BP 112/64    Pulse 70    Temp 98.1 F (36.7 C) (Oral)    SpO2 96%   Wt Readings from Last 3 Encounters:  12/03/21 174 lb 3.2 oz (79 kg)  10/30/21 174 lb (78.9 kg)  09/25/21 179 lb (81.2 kg)    Physical Exam Vitals and nursing note reviewed.   Constitutional:      General: He is not in acute distress.    Appearance: Normal appearance. He is not ill-appearing, toxic-appearing or diaphoretic.  HENT:     Head: Normocephalic.     Right Ear: External ear normal.     Left Ear: External ear normal.     Nose: No rhinorrhea.     Mouth/Throat:     Mouth: Mucous membranes are moist.  Eyes:     General:        Right eye: No discharge.        Left eye: No discharge.     Extraocular Movements: Extraocular movements intact.     Conjunctiva/sclera: Conjunctivae normal.     Pupils: Pupils are equal, round, and reactive to light.  Cardiovascular:     Rate and Rhythm: Normal rate and regular rhythm.     Heart sounds: No murmur heard. Pulmonary:     Effort: Pulmonary effort is normal. No respiratory distress.     Breath sounds: Normal breath sounds. No wheezing, rhonchi or rales.  Abdominal:     General: Abdomen is flat. Bowel sounds are normal.  Musculoskeletal:     Cervical back: Normal range of motion and neck supple.  Skin:    General: Skin is warm and dry.     Capillary Refill: Capillary refill takes [name] than 2 seconds.  Neurological:     General:  No focal deficit present.     Mental Status: He is alert and oriented to person, place, and time.  Psychiatric:        Mood and Affect: Mood normal.        Behavior: Behavior normal.        Thought Content: Thought content normal.        Judgment: Judgment normal.    Results for orders placed or performed in visit on 01/05/22  CoaguChek XS/INR Waived (STAT)  Result Value Ref Range   INR 2.0 (H) 0.9 - 1.1   Prothrombin Time 24.5 sec      Assessment & Plan:   Problem List Items Addressed This Visit       Hematopoietic and Hemostatic   Antithrombin 3 deficiency (HCC) - Primary    Chronic,  2.0 today which is within goal.  Goal INR 2.0-3.0.  Change dosing-5 mg of Coumadin on Monday, Wednesday, Friday and Sunday & 2.5 mg of Coumadin on Tuesday, Thursday, Saturday.  Follow-up  INR in 1 month.  Bleeding precautions discussed with patient at visit today.       Relevant Orders   CoaguChek XS/INR Waived (STAT) (Completed)     Other   Encounter for current long-term use of anticoagulants    Chronic,  2.0 today which is within goal.  Goal INR 2.0-3.0.  Change dosing-5 mg of Coumadin on Monday, Wednesday, Friday and Sunday & 2.5 mg of Coumadin on Tuesday, Thursday, Saturday.  Follow-up INR in 1 month.  Bleeding precautions discussed with patient at visit today.       Relevant Orders   CoaguChek XS/INR Waived (STAT) (Completed)     Follow up plan: Return in about 1 month (around 02/02/2022) for Coag check.

## 2022-01-05 NOTE — Assessment & Plan Note (Signed)
Chronic,  2.0 today which is within goal.  Goal INR 2.0-3.0.  Change dosing-5 mg of Coumadin on Monday, Wednesday, Friday and Sunday & 2.5 mg of Coumadin on Tuesday, Thursday, Saturday.  Follow-up INR in 1 month.  Bleeding precautions discussed with patient at visit today.

## 2022-01-05 NOTE — Progress Notes (Signed)
Results discussed with patient during visit.

## 2022-02-02 ENCOUNTER — Other Ambulatory Visit: Payer: Self-pay

## 2022-02-02 ENCOUNTER — Encounter: Payer: Self-pay | Admitting: Nurse Practitioner

## 2022-02-02 ENCOUNTER — Ambulatory Visit: Payer: Medicare Other

## 2022-02-02 ENCOUNTER — Ambulatory Visit (INDEPENDENT_AMBULATORY_CARE_PROVIDER_SITE_OTHER): Payer: Medicare Other | Admitting: Nurse Practitioner

## 2022-02-02 VITALS — BP 129/67 | HR 60 | Temp 98.2°F | Wt 175.2 lb

## 2022-02-02 DIAGNOSIS — D6859 Other primary thrombophilia: Secondary | ICD-10-CM | POA: Diagnosis not present

## 2022-02-02 LAB — COAGUCHEK XS/INR WAIVED
INR: 1.8 — ABNORMAL HIGH (ref 0.9–1.1)
Prothrombin Time: 22.2 s

## 2022-02-02 NOTE — Assessment & Plan Note (Addendum)
Chronic,  1.8 today which is within goal.  Goal INR 2.0-3.0.  Change dosing-5 mg of Coumadin on Monday, Wednesday, Friday Saturday and Sunday & 2.5 mg of Coumadin on Tuesday and Thursday.  Follow-up INR in 2 weeks.  Bleeding precautions discussed with patient at visit today.  ?

## 2022-02-02 NOTE — Progress Notes (Signed)
Results discussed with patient during visit.

## 2022-02-02 NOTE — Progress Notes (Signed)
? ?BP 129/67   Pulse 60   Temp 98.2 ?F (36.8 ?C) (Oral)   Wt 175 lb 3.2 oz (79.5 kg)   SpO2 96%   BMI 23.76 kg/m?   ? ?Subjective:  ? ? Patient ID: Thomas [name], male    DOB: May 25, 1946, 76 y.o.   MRN: [id number] ? ?HPI: ?Thomas [name] is a 76 y.o. male ? ?Chief Complaint  ?Patient presents with  ? Antithrombin 3 Deficiency  ?  1 month f/up   ? ?This patient is a 76 y.o. male who presents for coumadin management. His last INR was 1.8.  He is here for a 1 month visit to ensure he is back within range.  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  Patient denies any recent bleeding.  Denies night sweats, fever, fatigue, or weight loss. ? ?COUMADIN CHECK ?INR Today: 1.8 ?Present Coumadin dose: 5 mg of Coumadin on Monday, Wednesday, Friday & Sunday 2.5 mg of Coumadin on Tuesday, Thursday, Saturday ?Goal: 2.0-3.0 ?Excessive bruising: no ?Nose bleeding: no ?Rectal bleeding: no ?Prolonged menstrual cycles: N/A ?Eating diet with consistent amounts of foods containing Vitamin K:yes ?Any recent antibiotic use? no ?Relevant past medical, surgical, family and social history reviewed and updated as indicated. Interim medical history since our last visit reviewed. ?Allergies and medications reviewed and updated. ? ? ? ?Review of Systems  ?Constitutional:  Negative for chills, diaphoresis, fatigue, fever and unexpected weight change.  ?Eyes:  Negative for visual disturbance.  ?Respiratory:  Negative for shortness of breath.   ?Cardiovascular:  Negative for chest pain and leg swelling.  ?Neurological:  Negative for light-headedness and headaches.  ? ?Per HPI unless specifically indicated above ? ?   ?Objective:  ?  ?BP 129/67   Pulse 60   Temp 98.2 ?F (36.8 ?C) (Oral)   Wt 175 lb 3.2 oz (79.5 kg)   SpO2 96%   BMI 23.76 kg/m?   ?Wt Readings from Last 3 Encounters:  ?02/02/22 175 lb 3.2 oz (79.5 kg)   ?12/03/21 174 lb 3.2 oz (79 kg)  ?10/30/21 174 lb (78.9 kg)  ?  ?Physical Exam ?Vitals and nursing note reviewed.  ?Constitutional:   ?   General: He is not in acute distress. ?   Appearance: Normal appearance. He is not ill-appearing, toxic-appearing or diaphoretic.  ?HENT:  ?   Head: Normocephalic.  ?   Right Ear: External ear normal.  ?   Left Ear: External ear normal.  ?   Nose: No rhinorrhea.  ?   Mouth/Throat:  ?   Mouth: Mucous membranes are moist.  ?Eyes:  ?   General:     ?   Right eye: No discharge.     ?   Left eye: No discharge.  ?   Extraocular Movements: Extraocular movements intact.  ?   Conjunctiva/sclera: Conjunctivae normal.  ?   Pupils: Pupils are equal, round, and reactive to light.  ?Cardiovascular:  ?   Rate and Rhythm: Normal rate and regular rhythm.  ?   Heart sounds: No murmur heard. ?Pulmonary:  ?   Effort: Pulmonary effort is normal. No respiratory distress.  ?   Breath sounds: Normal breath sounds. No wheezing, rhonchi or rales.  ?Abdominal:  ?   General: Abdomen is flat. Bowel sounds are normal.  ?Musculoskeletal:  ?   Cervical back: Normal range of motion and neck supple.  ?  Skin: ?   General: Skin is warm and dry.  ?   Capillary Refill: Capillary refill takes [name] than 2 seconds.  ?Neurological:  ?   General: No focal deficit present.  ?   Mental Status: He is alert and oriented to person, place, and time.  ?Psychiatric:     ?   Mood and Affect: Mood normal.     ?   Behavior: Behavior normal.     ?   Thought Content: Thought content normal.     ?   Judgment: Judgment normal.  ? ? ?Results for orders placed or performed in visit on 01/05/22  ?CoaguChek XS/INR Waived (STAT)  ?Result Value Ref Range  ? INR 2.0 (H) 0.9 - 1.1  ? Prothrombin Time 24.5 sec  ? ?   ?Assessment & Plan:  ? ?Problem List Items Addressed This Visit   ? ?  ? Hematopoietic and Hemostatic  ? Antithrombin 3 deficiency (HCC) - Primary  ?  Chronic,  1.8 today which is within goal.  Goal INR 2.0-3.0.  Change dosing-5 mg  of Coumadin on Monday, Wednesday, Friday Saturday and Sunday & 2.5 mg of Coumadin on Tuesday and Thursday.  Follow-up INR in 2 weeks.  Bleeding precautions discussed with patient at visit today.  ?  ?  ? Relevant Orders  ? CoaguChek XS/INR Waived (STAT)  ?  ? ?Follow up plan: ?Return in about 2 weeks (around 02/16/2022) for Coag check. ? ? ? ? ? ? ? ? ? ?

## 2022-02-02 NOTE — Patient Instructions (Addendum)
Change dosing- 5 mg of Coumadin on Monday, Wednesday, Friday, Saturday and Sunday & 2.5 mg of Coumadin on Tuesday, Thursday. ?

## 2022-02-08 ENCOUNTER — Other Ambulatory Visit: Payer: Self-pay | Admitting: Nurse Practitioner

## 2022-02-08 DIAGNOSIS — Z86711 Personal history of pulmonary embolism: Secondary | ICD-10-CM

## 2022-02-09 NOTE — Telephone Encounter (Signed)
Requested medication (s) are due for refill today: yes ? ?Requested medication (s) are on the active medication list: yes ? ?Last refill:  08/11/21 #90/1 ? ?Future visit scheduled: yes ? ?Notes to clinic:  Unable to refill per protocol due to failed labs, no updated results. ? ? ? ?  ?Requested Prescriptions  ?Pending Prescriptions Disp Refills  ? warfarin (COUMADIN) 5 MG tablet [Pharmacy Med Name: WARFARIN SODIUM 5 MG TABLET] 90 tablet 1  ?  Sig: Take 1 tablet (5 mg total) by mouth daily.  ?  ? Hematology:  Anticoagulants - warfarin Failed - 02/08/2022  1:20 AM  ?  ?  Failed - Manual Review: If patient's warfarin is managed by Anti-Coag team, route request to them. If not, route request to the provider.  ?  ?  Failed - INR in normal range and within 30 days  ?  INR  ?Date Value Ref Range Status  ?02/02/2022 1.8 (H) 0.9 - 1.1 Final  ?  ?  ?  ?  Failed - HCT in normal range and within 360 days  ?  Hematocrit  ?Date Value Ref Range Status  ?01/29/2021 40.8 37.5 - 51.0 % Final  ?  ?  ?  ?  Passed - Patient is not pregnant  ?  ?  Passed - Valid encounter within last 3 months  ?  Recent Outpatient Visits   ? ?      ? 1 week ago Antithrombin 3 deficiency ([place])  ? [place], NP  ? 1 month ago Antithrombin 3 deficiency [facility])  ? [place], NP  ? 2 months ago Essential hypertension  ? [place], NP  ? 3 months ago Antithrombin 3 deficiency [facility])  ? [place], NP  ? 4 months ago Antithrombin 3 deficiency [facility])  ? [facility] Jon Billings, NP  ? ?  ?  ?Future Appointments   ? ?        ? In 1 week Jon Billings, NP [facility]  ? ?  ? ?  ?  ?  ? ?

## 2022-02-14 ENCOUNTER — Other Ambulatory Visit: Payer: Self-pay | Admitting: Nurse Practitioner

## 2022-02-16 NOTE — Telephone Encounter (Signed)
Requested Prescriptions  ?Pending Prescriptions Disp Refills  ?? atorvastatin (LIPITOR) 10 MG tablet [Pharmacy Med Name: ATORVASTATIN 10 MG TABLET] 90 tablet 1  ?  Sig: TAKE 1 TABLET BY MOUTH EVERY DAY  ?  ? Cardiovascular:  Antilipid - Statins Failed - 02/14/2022 12:53 PM  ?  ?  Failed - Lipid Panel in normal range within the last 12 months  ?  Cholesterol, Total  ?Date Value Ref Range Status  ?12/03/2021 106 100 - 199 mg/dL Final  ? ?LDL Chol Calc (NIH)  ?Date Value Ref Range Status  ?12/03/2021 53 0 - 99 mg/dL Final  ? ?HDL  ?Date Value Ref Range Status  ?12/03/2021 42 >39 mg/dL Final  ? ?Triglycerides  ?Date Value Ref Range Status  ?12/03/2021 46 0 - 149 mg/dL Final  ? ?  ?  ?  Passed - Patient is not pregnant  ?  ?  Passed - Valid encounter within last 12 months  ?  Recent Outpatient Visits   ?      ? 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 [place], NP  ? 1 month ago Antithrombin 3 deficiency [facility])  ? [place], NP  ? 2 months ago Essential hypertension  ? [place], NP  ? 3 months ago Antithrombin 3 deficiency [facility])  ? [place], NP  ? 4 months ago Antithrombin 3 deficiency [facility])  ? [facility] Jon Billings, NP  ?  ?  ?Future Appointments   ?        ? In 2 days Jon Billings, NP [facility], [facility]  ?  ? ?  ?  ?  ? ? ?

## 2022-02-18 ENCOUNTER — Other Ambulatory Visit: Payer: Self-pay

## 2022-02-18 ENCOUNTER — Ambulatory Visit (INDEPENDENT_AMBULATORY_CARE_PROVIDER_SITE_OTHER): Payer: Medicare Other | Admitting: Nurse Practitioner

## 2022-02-18 ENCOUNTER — Encounter: Payer: Self-pay | Admitting: Nurse Practitioner

## 2022-02-18 VITALS — BP 127/62 | HR 70 | Temp 98.1°F | Wt 171.0 lb

## 2022-02-18 DIAGNOSIS — D6859 Other primary thrombophilia: Secondary | ICD-10-CM

## 2022-02-18 DIAGNOSIS — Z7901 Long term (current) use of anticoagulants: Secondary | ICD-10-CM | POA: Diagnosis not present

## 2022-02-18 NOTE — Assessment & Plan Note (Signed)
Chronic,  3.0 today which is within goal.  Goal INR 2.0-3.0.  Change dosing-5 mg of Coumadin on Monday, Wednesday, Friday Saturday and Sunday & 2.5 mg of Coumadin on Tuesday and Thursday.  Follow-up INR in 1 month.  Bleeding precautions discussed with patient at visit today.  ?

## 2022-02-18 NOTE — Progress Notes (Signed)
? ?BP 127/62   Pulse 70   Temp 98.1 ?F (36.7 ?C) (Oral)   Wt 171 lb (77.6 kg)   SpO2 98%   BMI 23.19 kg/m?   ? ?Subjective:  ? ? Patient ID: Thomas [name], male    DOB: [birth year], 76 y.o.   MRN: [id number] ? ?HPI: ?Thomas [name] is a 76 y.o. male ? ?Chief Complaint  ?Patient presents with  ? Antithrombin 3 Deficiency  ? ?This patient is a 76 y.o. male who presents for coumadin management. His last INR was 3.0.  He is here for a 1 month visit to ensure he is back within range.  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  Patient denies any recent bleeding.  Denies night sweats, fever, fatigue, or weight loss. ? ?COUMADIN CHECK ?INR Today: 3.0 ?Present Coumadin dose: 5 mg of Coumadin on Monday, Wednesday, Friday & Sunday 2.5 mg of Coumadin on Tuesday, Thursday, Saturday ?Goal: 2.0-3.0 ?Excessive bruising: no ?Nose bleeding: no ?Rectal bleeding: no ?Prolonged menstrual cycles: N/A ?Eating diet with consistent amounts of foods containing Vitamin K:yes ?Any recent antibiotic use? no ?Relevant past medical, surgical, family and social history reviewed and updated as indicated. Interim medical history since our last visit reviewed. ?Allergies and medications reviewed and updated. ? ? ? ?Review of Systems  ?Constitutional:  Negative for chills, diaphoresis, fatigue, fever and unexpected weight change.  ?Eyes:  Negative for visual disturbance.  ?Respiratory:  Negative for shortness of breath.   ?Cardiovascular:  Negative for chest pain and leg swelling.  ?Neurological:  Negative for light-headedness and headaches.  ? ?Per HPI unless specifically indicated above ? ?   ?Objective:  ?  ?BP 127/62   Pulse 70   Temp 98.1 ?F (36.7 ?C) (Oral)   Wt 171 lb (77.6 kg)   SpO2 98%   BMI 23.19 kg/m?   ?Wt Readings from Last 3 Encounters:  ?02/18/22 171 lb (77.6 kg)  ?02/02/22 175 lb 3.2 oz (79.5 kg)   ?12/03/21 174 lb 3.2 oz (79 kg)  ?  ?Physical Exam ?Vitals and nursing note reviewed.  ?Constitutional:   ?   General: He is not in acute distress. ?   Appearance: Normal appearance. He is not ill-appearing, toxic-appearing or diaphoretic.  ?HENT:  ?   Head: Normocephalic.  ?   Right Ear: External ear normal.  ?   Left Ear: External ear normal.  ?   Nose: No rhinorrhea.  ?   Mouth/Throat:  ?   Mouth: Mucous membranes are moist.  ?Eyes:  ?   General:     ?   Right eye: No discharge.     ?   Left eye: No discharge.  ?   Extraocular Movements: Extraocular movements intact.  ?   Conjunctiva/sclera: Conjunctivae normal.  ?   Pupils: Pupils are equal, round, and reactive to light.  ?Cardiovascular:  ?   Rate and Rhythm: Normal rate and regular rhythm.  ?   Heart sounds: No murmur heard. ?Pulmonary:  ?   Effort: Pulmonary effort is normal. No respiratory distress.  ?   Breath sounds: Normal breath sounds. No wheezing, rhonchi or rales.  ?Abdominal:  ?   General: Abdomen is flat. Bowel sounds are normal.  ?Musculoskeletal:  ?   Cervical back: Normal range of motion and neck supple.  ?Skin: ?   General: Skin is warm and dry.  ?  Capillary Refill: Capillary refill takes [name] than 2 seconds.  ?Neurological:  ?   General: No focal deficit present.  ?   Mental Status: He is alert and oriented to person, place, and time.  ?Psychiatric:     ?   Mood and Affect: Mood normal.     ?   Behavior: Behavior normal.     ?   Thought Content: Thought content normal.     ?   Judgment: Judgment normal.  ? ? ?Results for orders placed or performed in visit on 02/02/22  ?CoaguChek XS/INR Waived (STAT)  ?Result Value Ref Range  ? INR 1.8 (H) 0.9 - 1.1  ? Prothrombin Time 22.2 sec  ? ?   ?Assessment & Plan:  ? ?Problem List Items Addressed This Visit   ? ?  ? Hematopoietic and Hemostatic  ? Antithrombin 3 deficiency (HCC) - Primary  ?  Chronic,  3.0 today which is within goal.  Goal INR 2.0-3.0.  Change dosing-5 mg of Coumadin on Monday,  Wednesday, Friday Saturday and Sunday & 2.5 mg of Coumadin on Tuesday and Thursday.  Follow-up INR in 1 month.  Bleeding precautions discussed with patient at visit today.  ?  ?  ? Relevant Orders  ? CoaguChek XS/INR Waived (STAT)  ?  ? Other  ? Encounter for current long-term use of anticoagulants  ?  Chronic,  3.0 today which is within goal.  Goal INR 2.0-3.0.  Change dosing-5 mg of Coumadin on Monday, Wednesday, Friday Saturday and Sunday & 2.5 mg of Coumadin on Tuesday and Thursday.  Follow-up INR in 1 month.  Bleeding precautions discussed with patient at visit today.  ?  ?  ? Relevant Orders  ? CoaguChek XS/INR Waived (STAT)  ?  ? ?Follow up plan: ?Return in about 1 month (around 03/21/2022) for Coag check. ? ? ? ? ? ? ? ? ? ?

## 2022-02-19 LAB — COAGUCHEK XS/INR WAIVED
INR: 3 — ABNORMAL HIGH (ref 0.9–1.1)
Prothrombin Time: 36.3 s

## 2022-02-19 NOTE — Progress Notes (Signed)
Results discussed with patient during visit.

## 2022-03-23 ENCOUNTER — Encounter: Payer: Self-pay | Admitting: Nurse Practitioner

## 2022-03-23 ENCOUNTER — Ambulatory Visit (INDEPENDENT_AMBULATORY_CARE_PROVIDER_SITE_OTHER): Payer: Medicare Other | Admitting: Nurse Practitioner

## 2022-03-23 VITALS — BP 136/76 | HR 74 | Temp 98.0°F | Wt 173.2 lb

## 2022-03-23 DIAGNOSIS — D6859 Other primary thrombophilia: Secondary | ICD-10-CM

## 2022-03-23 DIAGNOSIS — Z7901 Long term (current) use of anticoagulants: Secondary | ICD-10-CM | POA: Diagnosis not present

## 2022-03-23 LAB — COAGUCHEK XS/INR WAIVED
INR: 2.6 — ABNORMAL HIGH (ref 0.9–1.1)
Prothrombin Time: 30.7 s

## 2022-03-23 NOTE — Assessment & Plan Note (Addendum)
Chronic,  2.6 today which is within goal.  Goal INR 2.0-3.0.  Continue -5 mg of Coumadin on Monday, Wednesday, Friday Saturday and Sunday & 2.5 mg of Coumadin on Tuesday and Thursday.  Follow-up INR in 1 month.  Bleeding precautions discussed with patient at visit today.  ?

## 2022-03-23 NOTE — Progress Notes (Signed)
Results discussed with patient during visit.  Continue current dose of medication.

## 2022-03-23 NOTE — Progress Notes (Signed)
? ?BP 136/76   Pulse 74   Temp 98 ?F (36.7 ?C) (Oral)   Wt 173 lb 3.2 oz (78.6 kg)   SpO2 94%   BMI 23.49 kg/m?   ? ?Subjective:  ? ? Patient ID: Thomas [name], male    DOB: [birth year], 76 y.o.   MRN: [id number] ? ?HPI: ?Thomas [name] is a 76 y.o. male ? ?Chief Complaint  ?Patient presents with  ? Coagulation Disorder  ? ?This patient is a 76 y.o. male who presents for coumadin management. His last INR was 3.0.  He is here for a 1 month visit to ensure he is back within range.  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  Patient denies any recent bleeding.  Denies night sweats, fever, fatigue, or weight loss. ? ?COUMADIN CHECK ?INR Today: 3.0 ?Present Coumadin dose: 5 mg of Coumadin on Monday, Wednesday, Friday & Sunday 2.5 mg of Coumadin on Tuesday, Thursday, Saturday ?Goal: 2.0-3.0 ?Excessive bruising: no ?Nose bleeding: no ?Rectal bleeding: no ?Prolonged menstrual cycles: N/A ?Eating diet with consistent amounts of foods containing Vitamin K:yes ?Any recent antibiotic use? no ?Relevant past medical, surgical, family and social history reviewed and updated as indicated. Interim medical history since our last visit reviewed. ?Allergies and medications reviewed and updated. ? ? ? ?Review of Systems  ?Constitutional:  Negative for chills, diaphoresis, fatigue, fever and unexpected weight change.  ?Eyes:  Negative for visual disturbance.  ?Respiratory:  Negative for shortness of breath.   ?Cardiovascular:  Negative for chest pain and leg swelling.  ?Neurological:  Negative for light-headedness and headaches.  ? ?Per HPI unless specifically indicated above ? ?   ?Objective:  ?  ?BP 136/76   Pulse 74   Temp 98 ?F (36.7 ?C) (Oral)   Wt 173 lb 3.2 oz (78.6 kg)   SpO2 94%   BMI 23.49 kg/m?   ?Wt Readings from Last 3 Encounters:  ?03/23/22 173 lb 3.2 oz (78.6 kg)  ?02/18/22 171 lb (77.6 kg)   ?02/02/22 175 lb 3.2 oz (79.5 kg)  ?  ?Physical Exam ?Vitals and nursing note reviewed.  ?Constitutional:   ?   General: He is not in acute distress. ?   Appearance: Normal appearance. He is not ill-appearing, toxic-appearing or diaphoretic.  ?HENT:  ?   Head: Normocephalic.  ?   Right Ear: External ear normal.  ?   Left Ear: External ear normal.  ?   Nose: No rhinorrhea.  ?   Mouth/Throat:  ?   Mouth: Mucous membranes are moist.  ?Eyes:  ?   General:     ?   Right eye: No discharge.     ?   Left eye: No discharge.  ?   Extraocular Movements: Extraocular movements intact.  ?   Conjunctiva/sclera: Conjunctivae normal.  ?   Pupils: Pupils are equal, round, and reactive to light.  ?Cardiovascular:  ?   Rate and Rhythm: Normal rate and regular rhythm.  ?   Heart sounds: No murmur heard. ?Pulmonary:  ?   Effort: Pulmonary effort is normal. No respiratory distress.  ?   Breath sounds: Normal breath sounds. No wheezing, rhonchi or rales.  ?Abdominal:  ?   General: Abdomen is flat. Bowel sounds are normal.  ?Musculoskeletal:  ?   Cervical back: Normal range of motion and neck supple.  ?Skin: ?   General: Skin is warm  and dry.  ?   Capillary Refill: Capillary refill takes [name] than 2 seconds.  ?Neurological:  ?   General: No focal deficit present.  ?   Mental Status: He is alert and oriented to person, place, and time.  ?Psychiatric:     ?   Mood and Affect: Mood normal.     ?   Behavior: Behavior normal.     ?   Thought Content: Thought content normal.     ?   Judgment: Judgment normal.  ? ? ?Results for orders placed or performed in visit on 02/18/22  ?CoaguChek XS/INR Waived (STAT)  ?Result Value Ref Range  ? INR 3.0 (H) 0.9 - 1.1  ? Prothrombin Time 36.3 sec  ? ?   ?Assessment & Plan:  ? ?Problem List Items Addressed This Visit   ? ?  ? Hematopoietic and Hemostatic  ? Antithrombin 3 deficiency (HCC) - Primary  ?  Chronic,  2.6 today which is within goal.  Goal INR 2.0-3.0.  Continue -5 mg of Coumadin on Monday,  Wednesday, Friday Saturday and Sunday & 2.5 mg of Coumadin on Tuesday and Thursday.  Follow-up INR in 1 month.  Bleeding precautions discussed with patient at visit today.  ?  ?  ? Relevant Orders  ? CoaguChek XS/INR Waived (STAT)  ?  ? Other  ? Encounter for current long-term use of anticoagulants  ?  Chronic,  2.6 today which is within goal.  Goal INR 2.0-3.0.  Continue -5 mg of Coumadin on Monday, Wednesday, Friday Saturday and Sunday & 2.5 mg of Coumadin on Tuesday and Thursday.  Follow-up INR in 1 month.  Bleeding precautions discussed with patient at visit today.  ?  ?  ? Relevant Orders  ? CoaguChek XS/INR Waived (STAT)  ?  ? ?Follow up plan: ?Return in about 1 month (around 04/22/2022) for Coag check. ? ? ? ? ? ? ? ? ? ?

## 2022-04-22 NOTE — Progress Notes (Unsigned)
There were no vitals taken for this visit.   Subjective:    Patient ID: Thomas [name], male    DOB: Sep 19, 1946, 76 y.o.   MRN: [id number]  HPI: Thomas [name] is a 76 y.o. male  No chief complaint on file.  This patient is a 76 y.o. male who presents for coumadin management. His last INR was 2.5.  He is here for a 1 month visit to ensure he is back within range.  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  Patient denies any recent bleeding.  Denies night sweats, fever, fatigue, or weight loss.  COUMADIN CHECK INR Today: 2.5 Present Coumadin dose: 2.5 mg of Coumadin on Monday, Wednesday, Friday & Sunday 5 mg of Coumadin on Tuesday, Thursday, Saturday Goal: 2.0-3.0 Excessive bruising: no Nose bleeding: no Rectal bleeding: no Prolonged menstrual cycles: N/A Eating diet with consistent amounts of foods containing Vitamin K:yes Any recent antibiotic use? no Relevant past medical, surgical, family and social history reviewed and updated as indicated. Interim medical history since our last visit reviewed. Allergies and medications reviewed and updated.  HYPERTENSION Hypertension status: controlled  Satisfied with current treatment? no Duration of hypertension: years BP monitoring frequency:  not checking BP range:  BP medication side effects:  no Medication compliance: excellent compliance Previous BP meds:amlodipine Aspirin:  Coumadin Recurrent headaches: no Visual changes: no Palpitations: no Dyspnea: no Chest pain: no Lower extremity edema: no Dizzy/lightheaded: no   Review of Systems  Constitutional:  Negative for chills, diaphoresis, fatigue, fever and unexpected weight change.  Eyes:  Negative for visual disturbance.  Respiratory:  Negative for shortness of breath.   Cardiovascular:  Negative for chest pain and leg swelling.  Neurological:   Negative for light-headedness and headaches.   Per HPI unless specifically indicated above     Objective:    There were no vitals taken for this visit.  Wt Readings from Last 3 Encounters:  03/23/22 173 lb 3.2 oz (78.6 kg)  02/18/22 171 lb (77.6 kg)  02/02/22 175 lb 3.2 oz (79.5 kg)    Physical Exam Vitals and nursing note reviewed.  Constitutional:      General: He is not in acute distress.    Appearance: Normal appearance. He is not ill-appearing, toxic-appearing or diaphoretic.  HENT:     Head: Normocephalic.      Right Ear: External ear normal.     Left Ear: External ear normal.     Nose: No rhinorrhea.     Mouth/Throat:     Mouth: Mucous membranes are moist.  Eyes:     General:        Right eye: No discharge.        Left eye: No discharge.     Extraocular Movements: Extraocular movements intact.     Conjunctiva/sclera: Conjunctivae normal.     Pupils: Pupils are equal, round, and reactive to light.  Cardiovascular:     Rate and Rhythm: Normal rate and regular rhythm.     Heart sounds: No murmur heard. Pulmonary:     Effort: Pulmonary effort is normal. No respiratory distress.     Breath sounds: Normal breath sounds. No wheezing, rhonchi or rales.  Abdominal:     General: Abdomen is flat. Bowel sounds are normal.  Musculoskeletal:     Cervical back: Normal range of motion and neck supple.  Skin:    General:  Skin is warm and dry.     Capillary Refill: Capillary refill takes [name] than 2 seconds.  Neurological:     General: No focal deficit present.     Mental Status: He is alert and oriented to person, place, and time.  Psychiatric:        Mood and Affect: Mood normal.        Behavior: Behavior normal.        Thought Content: Thought content normal.        Judgment: Judgment normal.    Results for orders placed or performed in visit on 03/23/22  CoaguChek XS/INR Waived (STAT)  Result Value Ref Range   INR 2.6 (H) 0.9 - 1.1   Prothrombin Time 30.7 sec       Assessment & Plan:   Problem List Items Addressed This Visit      Cardiovascular and Mediastinum   Essential hypertension - Primary     Endocrine   Impaired fasting glucose     Hematopoietic and Hemostatic   Antithrombin 3 deficiency (HCC)     Other   Hyperlipidemia   Encounter for current long-term use of anticoagulants     Follow up plan: No follow-ups on file.

## 2022-04-23 ENCOUNTER — Ambulatory Visit (INDEPENDENT_AMBULATORY_CARE_PROVIDER_SITE_OTHER): Payer: Medicare Other | Admitting: Nurse Practitioner

## 2022-04-23 ENCOUNTER — Encounter: Payer: Self-pay | Admitting: Nurse Practitioner

## 2022-04-23 VITALS — BP 125/68 | HR 62 | Temp 97.7°F | Wt 171.2 lb

## 2022-04-23 DIAGNOSIS — I10 Essential (primary) hypertension: Secondary | ICD-10-CM | POA: Diagnosis not present

## 2022-04-23 DIAGNOSIS — R7301 Impaired fasting glucose: Secondary | ICD-10-CM

## 2022-04-23 DIAGNOSIS — E785 Hyperlipidemia, unspecified: Secondary | ICD-10-CM

## 2022-04-23 DIAGNOSIS — Z7901 Long term (current) use of anticoagulants: Secondary | ICD-10-CM | POA: Diagnosis not present

## 2022-04-23 DIAGNOSIS — D6859 Other primary thrombophilia: Secondary | ICD-10-CM

## 2022-04-23 LAB — COAGUCHEK XS/INR WAIVED
INR: 2.4 — ABNORMAL HIGH (ref 0.9–1.1)
Prothrombin Time: 29 s

## 2022-04-23 MED ORDER — WARFARIN SODIUM 5 MG PO TABS: 5.0000 mg | ORAL_TABLET | Freq: Every day | ORAL | 1 refills | Status: DC

## 2022-04-23 MED ORDER — ATORVASTATIN CALCIUM 10 MG PO TABS: 10.0000 mg | ORAL_TABLET | Freq: Every day | ORAL | 1 refills | Status: DC

## 2022-04-23 MED ORDER — AMLODIPINE BESYLATE 2.5 MG PO TABS: 2.5000 mg | ORAL_TABLET | Freq: Every day | ORAL | 1 refills | Status: DC

## 2022-04-23 NOTE — Assessment & Plan Note (Signed)
Labs ordered today.  Will make recommendations based on lab results. ?

## 2022-04-23 NOTE — Assessment & Plan Note (Signed)
Chronic.  Controlled.  Continue with current medication regimen of Amlodipine 2.'5mg'$ .  Refills sent today.  Labs ordered today.  Return to clinic in 3 months for reevaluation.  Call sooner if concerns arise.

## 2022-04-23 NOTE — Progress Notes (Signed)
Results discussed with patient during visit. Continue current dose.

## 2022-04-23 NOTE — Assessment & Plan Note (Signed)
Chronic.  Controlled.  Continue with current medication regimen on Atorvastatin 10mg.  Labs ordered today.  Return to clinic in 3 months for reevaluation.  Call sooner if concerns arise.   

## 2022-04-24 LAB — HEMOGLOBIN A1C
Est. average glucose Bld gHb Est-mCnc: 120 mg/dL
Hgb A1c MFr Bld: 5.8 % — ABNORMAL HIGH (ref 4.8–5.6)

## 2022-04-24 LAB — COMPREHENSIVE METABOLIC PANEL
ALT: 8 IU/L (ref 0–44)
AST: 14 IU/L (ref 0–40)
Albumin/Globulin Ratio: 1.6 (ref 1.2–2.2)
Albumin: 4.6 g/dL (ref 3.7–4.7)
Alkaline Phosphatase: 62 IU/L (ref 44–121)
BUN/Creatinine Ratio: 10 (ref 10–24)
BUN: 11 mg/dL (ref 8–27)
Bilirubin Total: 0.4 mg/dL (ref 0.0–1.2)
CO2: 22 mmol/L (ref 20–29)
Calcium: 9 mg/dL (ref 8.6–10.2)
Chloride: 104 mmol/L (ref 96–106)
Creatinine, Ser: 1.05 mg/dL (ref 0.76–1.27)
Globulin, Total: 2.9 g/dL (ref 1.5–4.5)
Glucose: 90 mg/dL (ref 70–99)
Potassium: 4.5 mmol/L (ref 3.5–5.2)
Sodium: 140 mmol/L (ref 134–144)
Total Protein: 7.5 g/dL (ref 6.0–8.5)
eGFR: 74 mL/min/{1.73_m2} (ref >59)

## 2022-04-24 LAB — LIPID PANEL
Chol/HDL Ratio: 2.6 ratio (ref 0.0–5.0)
Cholesterol, Total: 113 mg/dL (ref 100–199)
HDL: 43 mg/dL (ref >39)
LDL Chol Calc (NIH): 56 mg/dL (ref 0–99)
Triglycerides: 63 mg/dL (ref 0–149)
VLDL Cholesterol Cal: 14 mg/dL (ref 5–40)

## 2022-04-24 NOTE — Progress Notes (Signed)
Please let patient know that his blood work looks good.  No concerns at this time.  Follow up as discussed.

## 2022-05-27 ENCOUNTER — Ambulatory Visit (INDEPENDENT_AMBULATORY_CARE_PROVIDER_SITE_OTHER): Payer: Medicare Other | Admitting: Nurse Practitioner

## 2022-05-27 ENCOUNTER — Encounter: Payer: Self-pay | Admitting: Nurse Practitioner

## 2022-05-27 VITALS — BP 125/79 | HR 62 | Temp 98.1°F | Wt 169.8 lb

## 2022-05-27 DIAGNOSIS — D6859 Other primary thrombophilia: Secondary | ICD-10-CM | POA: Diagnosis not present

## 2022-05-27 LAB — COAGUCHEK XS/INR WAIVED
INR: 2.4 — ABNORMAL HIGH (ref 0.9–1.1)
Prothrombin Time: 28.3 s

## 2022-05-27 NOTE — Assessment & Plan Note (Signed)
Chronic,  2.4 today which is within goal.  Goal INR 2.0-3.0.  Continue -5 mg of Coumadin on Monday, Wednesday, Friday Saturday and Sunday & 2.5 mg of Coumadin on Tuesday and Thursday.  Follow-up INR in 1 month.  Bleeding precautions discussed with patient at visit today.

## 2022-05-27 NOTE — Progress Notes (Signed)
Results discussed with patient during visit.

## 2022-05-27 NOTE — Progress Notes (Signed)
BP 125/79   Pulse 62   Temp 98.1 F (36.7 C) (Oral)   Wt 169 lb 12.8 oz (77 kg)   SpO2 98%   BMI 23.03 kg/m    Subjective:    Patient ID: Thomas [name], male    DOB: [birth year], 76 y.o.   MRN: [id number]  HPI: Thomas [name] is a 76 y.o. male  Chief Complaint  Patient presents with   Coagulation Disorder    1 month follow up    This patient is a 76 y.o. male who presents for coumadin management. His last INR was 2.6.  He is here for a 1 month visit to ensure he is back within range.  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  Patient denies any recent bleeding.  Denies night sweats, fever, fatigue, or weight loss.  COUMADIN CHECK INR Today: 2.4 Present Coumadin dose: 5 mg of Coumadin on Monday, Wednesday, Friday & Sunday 2.5 mg of Coumadin on Tuesday, Thursday, Saturday Goal: 2.0-3.0 Excessive bruising: no Nose bleeding: no Rectal bleeding: no Prolonged menstrual cycles: N/A Eating diet with consistent amounts of foods containing Vitamin K:yes Any recent antibiotic use? no Relevant past medical, surgical, family and social history reviewed and updated as indicated. Interim medical history since our last visit reviewed. Allergies and medications reviewed and updated.    Review of Systems  Constitutional:  Negative for chills, diaphoresis, fatigue, fever and unexpected weight change.  Eyes:  Negative for visual disturbance.  Respiratory:  Negative for shortness of breath.   Cardiovascular:  Negative for chest pain and leg swelling.  Neurological:  Negative for light-headedness and headaches.    Per HPI unless specifically indicated above     Objective:    BP 125/79   Pulse 62   Temp 98.1 F (36.7 C) (Oral)   Wt 169 lb 12.8 oz (77 kg)   SpO2 98%   BMI 23.03 kg/m   Wt Readings from Last 3 Encounters:  05/27/22 169 lb 12.8 oz (77 kg)   04/23/22 171 lb 3.2 oz (77.7 kg)  03/23/22 173 lb 3.2 oz (78.6 kg)    Physical Exam Vitals and nursing note reviewed.  Constitutional:      General: He is not in acute distress.    Appearance: Normal appearance. He is not ill-appearing, toxic-appearing or diaphoretic.  HENT:     Head: Normocephalic.     Right Ear: External ear normal.     Left Ear: External ear normal.     Nose: No rhinorrhea.     Mouth/Throat:     Mouth: Mucous membranes are moist.  Eyes:     General:        Right eye: No discharge.        Left eye: No discharge.     Extraocular Movements: Extraocular movements intact.     Conjunctiva/sclera: Conjunctivae normal.     Pupils: Pupils are equal, round, and reactive to light.  Cardiovascular:     Rate and Rhythm: Normal rate and regular rhythm.     Heart sounds: No murmur heard. Pulmonary:     Effort: Pulmonary effort is normal. No respiratory distress.     Breath sounds: Normal breath sounds. No wheezing, rhonchi or rales.  Abdominal:     General: Abdomen is flat. Bowel sounds are normal.  Musculoskeletal:     Cervical back: Normal range of motion and  neck supple.  Skin:    General: Skin is warm and dry.     Capillary Refill: Capillary refill takes [name] than 2 seconds.  Neurological:     General: No focal deficit present.     Mental Status: He is alert and oriented to person, place, and time.  Psychiatric:        Mood and Affect: Mood normal.        Behavior: Behavior normal.        Thought Content: Thought content normal.        Judgment: Judgment normal.     Results for orders placed or performed in visit on 05/27/22  CoaguChek XS/INR Waived (STAT)  Result Value Ref Range   INR 2.4 (H) 0.9 - 1.1   Prothrombin Time 28.3 sec      Assessment & Plan:   Problem List Items Addressed This Visit       Hematopoietic and Hemostatic   Antithrombin 3 deficiency (HCC) - Primary    Chronic,  2.4 today which is within goal.  Goal INR 2.0-3.0.  Continue  -5 mg of Coumadin on Monday, Wednesday, Friday Saturday and Sunday & 2.5 mg of Coumadin on Tuesday and Thursday.  Follow-up INR in 1 month.  Bleeding precautions discussed with patient at visit today.       Relevant Orders   CoaguChek XS/INR Waived (STAT) (Completed)     Follow up plan: Return in about 1 month (around 06/26/2022) for Coag check.

## 2022-06-23 ENCOUNTER — Ambulatory Visit: Payer: Medicare Other | Admitting: Nurse Practitioner

## 2022-06-23 NOTE — Progress Notes (Deleted)
There were no vitals taken for this visit.   Subjective:    Patient ID: Thomas [name], male    DOB: [birth year], 76 y.o.   MRN: [id number]  HPI: Thomas [name] is a 76 y.o. male  No chief complaint on file.  This patient is a 76 y.o. male who presents for coumadin management. His last INR was 2.6.  He is here for a 1 month visit to ensure he is back within range.  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  Patient denies any recent bleeding.  Denies night sweats, fever, fatigue, or weight loss.  COUMADIN CHECK INR Today: 2.4 Present Coumadin dose: 5 mg of Coumadin on Monday, Wednesday, Friday & Sunday 2.5 mg of Coumadin on Tuesday, Thursday, Saturday Goal: 2.0-3.0 Excessive bruising: no Nose bleeding: no Rectal bleeding: no Prolonged menstrual cycles: N/A Eating diet with consistent amounts of foods containing Vitamin K:yes Any recent antibiotic use? no Relevant past medical, surgical, family and social history reviewed and updated as indicated. Interim medical history since our last visit reviewed. Allergies and medications reviewed and updated.    Review of Systems  Constitutional:  Negative for chills, diaphoresis, fatigue, fever and unexpected weight change.  Eyes:  Negative for visual disturbance.  Respiratory:  Negative for shortness of breath.   Cardiovascular:  Negative for chest pain and leg swelling.  Neurological:  Negative for light-headedness and headaches.    Per HPI unless specifically indicated above     Objective:    There were no vitals taken for this visit.  Wt Readings from Last 3 Encounters:  05/27/22 169 lb 12.8 oz (77 kg)  04/23/22 171 lb 3.2 oz (77.7 kg)  03/23/22 173 lb 3.2 oz (78.6 kg)    Physical Exam Vitals and nursing note reviewed.  Constitutional:      General: He is not in acute distress.    Appearance:  Normal appearance. He is not ill-appearing, toxic-appearing or diaphoretic.  HENT:     Head: Normocephalic.     Right Ear: External ear normal.     Left Ear: External ear normal.     Nose: No rhinorrhea.     Mouth/Throat:     Mouth: Mucous membranes are moist.  Eyes:     General:        Right eye: No discharge.        Left eye: No discharge.     Extraocular Movements: Extraocular movements intact.     Conjunctiva/sclera: Conjunctivae normal.     Pupils: Pupils are equal, round, and reactive to light.  Cardiovascular:     Rate and Rhythm: Normal rate and regular rhythm.     Heart sounds: No murmur heard. Pulmonary:     Effort: Pulmonary effort is normal. No respiratory distress.     Breath sounds: Normal breath sounds. No wheezing, rhonchi or rales.  Abdominal:     General: Abdomen is flat. Bowel sounds are normal.  Musculoskeletal:     Cervical back: Normal range of motion and neck supple.  Skin:    General: Skin is warm and dry.     Capillary Refill: Capillary refill takes [name] than 2 seconds.  Neurological:     General: No focal deficit present.     Mental Status: He is alert and oriented to person, place, and time.  Psychiatric:        Mood and  Affect: Mood normal.        Behavior: Behavior normal.        Thought Content: Thought content normal.        Judgment: Judgment normal.    Results for orders placed or performed in visit on 05/27/22  CoaguChek XS/INR Waived (STAT)  Result Value Ref Range   INR 2.4 (H) 0.9 - 1.1   Prothrombin Time 28.3 sec      Assessment & Plan:   Problem List Items Addressed This Visit   None    Follow up plan: No follow-ups on file.

## 2022-06-26 ENCOUNTER — Ambulatory Visit: Payer: Medicare Other | Admitting: Nurse Practitioner

## 2022-07-24 ENCOUNTER — Ambulatory Visit (INDEPENDENT_AMBULATORY_CARE_PROVIDER_SITE_OTHER): Payer: Medicare Other | Admitting: Nurse Practitioner

## 2022-07-24 ENCOUNTER — Encounter: Payer: Self-pay | Admitting: Nurse Practitioner

## 2022-07-24 VITALS — BP 121/64 | HR 75 | Temp 98.9°F | Wt 169.3 lb

## 2022-07-24 DIAGNOSIS — D6859 Other primary thrombophilia: Secondary | ICD-10-CM | POA: Diagnosis not present

## 2022-07-24 LAB — COAGUCHEK XS/INR WAIVED
INR: 3.1 — ABNORMAL HIGH (ref 0.9–1.1)
Prothrombin Time: 37.2 s

## 2022-07-24 NOTE — Progress Notes (Signed)
BP 121/64   Pulse 75   Temp 98.9 F (37.2 C) (Oral)   Wt 169 lb 4.8 oz (76.8 kg)   SpO2 95%   BMI 22.96 kg/m    Subjective:    Patient ID: Thomas [name], male    DOB: 08-28-1946, 76 y.o.   MRN: [id number]  HPI: KAVIN WECKWERTH is a 76 y.o. male  Chief Complaint  Patient presents with   Coagulation Disorder    1 month follow up    This patient is a 76 y.o. male who presents for coumadin management. His last INR was 2.4.  He is here for a 1 month visit to ensure he is back within range.  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  Patient denies any recent bleeding.  Denies night sweats, fever, fatigue, or weight loss.  COUMADIN CHECK INR Today:3.1 Present Coumadin dose: 5 mg of Coumadin on Monday, Wednesday, Friday & Sunday 2.5 mg of Coumadin on Tuesday, Thursday, Saturday Goal: 2.0-3.0 Excessive bruising: no Nose bleeding: no Rectal bleeding: no Prolonged menstrual cycles: N/A Eating diet with consistent amounts of foods containing Vitamin K:yes Any recent antibiotic use? no   Relevant past medical, surgical, family and social history reviewed and updated as indicated. Interim medical history since our last visit reviewed. Allergies and medications reviewed and updated.    Review of Systems  Constitutional:  Negative for chills, diaphoresis, fatigue, fever and unexpected weight change.  Eyes:  Negative for visual disturbance.  Respiratory:  Negative for shortness of breath.   Cardiovascular:  Negative for chest pain and leg swelling.  Neurological:  Negative for light-headedness and headaches.    Per HPI unless specifically indicated above     Objective:    BP 121/64   Pulse 75   Temp 98.9 F (37.2 C) (Oral)   Wt 169 lb 4.8 oz (76.8 kg)   SpO2 95%   BMI 22.96 kg/m   Wt Readings from Last 3 Encounters:  07/24/22 169 lb 4.8 oz (76.8  kg)  05/27/22 169 lb 12.8 oz (77 kg)  04/23/22 171 lb 3.2 oz (77.7 kg)    Physical Exam Vitals and nursing note reviewed.  Constitutional:      General: He is not in acute distress.    Appearance: Normal appearance. He is not ill-appearing, toxic-appearing or diaphoretic.  HENT:     Head: Normocephalic.     Right Ear: External ear normal.     Left Ear: External ear normal.     Nose: No rhinorrhea.     Mouth/Throat:     Mouth: Mucous membranes are moist.  Eyes:     General:        Right eye: No discharge.        Left eye: No discharge.     Extraocular Movements: Extraocular movements intact.     Conjunctiva/sclera: Conjunctivae normal.     Pupils: Pupils are equal, round, and reactive to light.  Cardiovascular:     Rate and Rhythm: Normal rate and regular rhythm.     Heart sounds: No murmur heard. Pulmonary:     Effort: Pulmonary effort is normal. No respiratory distress.     Breath sounds: Normal breath sounds. No wheezing, rhonchi or rales.  Abdominal:     General: Abdomen is flat. Bowel sounds are normal.  Musculoskeletal:     Cervical back: Normal range of motion  and neck supple.  Skin:    General: Skin is warm and dry.     Capillary Refill: Capillary refill takes [name] than 2 seconds.  Neurological:     General: No focal deficit present.     Mental Status: He is alert and oriented to person, place, and time.  Psychiatric:        Mood and Affect: Mood normal.        Behavior: Behavior normal.        Thought Content: Thought content normal.        Judgment: Judgment normal.     Results for orders placed or performed in visit on 05/27/22  CoaguChek XS/INR Waived (STAT)  Result Value Ref Range   INR 2.4 (H) 0.9 - 1.1   Prothrombin Time 28.3 sec      Assessment & Plan:   Problem List Items Addressed This Visit       Hematopoietic and Hemostatic   Antithrombin 3 deficiency (HCC) - Primary    Chronic,  3.1 today which is within goal.  Goal INR 2.0-3.0.   Continue -5 mg of Coumadin on Monday, Wednesday, Friday Saturday and Sunday & 2.5 mg of Coumadin on Tuesday and Thursday.  Follow-up INR in 2 weeks.  Decrease green leafy vegetables.  Follow up in 2 weeks.  Will adjust dose if needed at that time.  Bleeding precautions discussed with patient at visit today.       Relevant Orders   CoaguChek XS/INR Waived (STAT)     Follow up plan: Return in about 2 weeks (around 08/07/2022) for Coag check.

## 2022-07-24 NOTE — Progress Notes (Deleted)
There were no vitals taken for this visit.   Subjective:    Patient ID: Thomas [name], male    DOB: 1946-10-24, 76 y.o.   MRN: [id number]  HPI: Thomas [name] is a 76 y.o. male  Chief Complaint  Patient presents with   Coagulation Disorder    1 month follow up    This patient is a 76 y.o. male who presents for coumadin management. His last INR was 2.5.  He is here for a 1 month visit to ensure he is back within range.  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  Patient denies any recent bleeding.  Denies night sweats, fever, fatigue, or weight loss.  COUMADIN CHECK INR Today: 2.5 Present Coumadin dose: 2.5 mg of Coumadin on Monday, Wednesday, Friday & Sunday 5 mg of Coumadin on Tuesday, Thursday, Saturday Goal: 2.0-3.0 Excessive bruising: no Nose bleeding: no Rectal bleeding: no Prolonged menstrual cycles: N/A Eating diet with consistent amounts of foods containing Vitamin K:yes Any recent antibiotic use? no Relevant past medical, surgical, family and social history reviewed and updated as indicated. Interim medical history since our last visit reviewed. Allergies and medications reviewed and updated.  HYPERTENSION Hypertension status: controlled  Satisfied with current treatment? no Duration of hypertension: years BP monitoring frequency:  not checking BP range:  BP medication side effects:  no Medication compliance: excellent compliance Previous BP meds:amlodipine Aspirin:  Coumadin Recurrent headaches: no Visual changes: no Palpitations: no Dyspnea: no Chest pain: no Lower extremity edema: no Dizzy/lightheaded: no   Review of Systems  Constitutional:  Negative for chills, diaphoresis, fatigue, fever and unexpected weight change.  Eyes:  Negative for visual disturbance.  Respiratory:  Negative for shortness of breath.   Cardiovascular:   Negative for chest pain and leg swelling.  Neurological:  Negative for light-headedness and headaches.    Per HPI unless specifically indicated above     Objective:    There were no vitals taken for this visit.  Wt Readings from Last 3 Encounters:  05/27/22 169 lb 12.8 oz (77 kg)  04/23/22 171 lb 3.2 oz (77.7 kg)  03/23/22 173 lb 3.2 oz (78.6 kg)    Physical Exam Vitals and nursing note reviewed.  Constitutional:      General: He is not in acute distress.    Appearance: Normal appearance. He is not ill-appearing, toxic-appearing or diaphoretic.  HENT:     Head: Normocephalic.      Right Ear: External ear normal.     Left Ear: External ear normal.     Nose: No rhinorrhea.     Mouth/Throat:     Mouth: Mucous membranes are moist.  Eyes:     General:        Right eye: No discharge.        Left eye: No discharge.     Extraocular Movements: Extraocular movements intact.     Conjunctiva/sclera: Conjunctivae normal.     Pupils: Pupils are equal, round, and reactive to light.  Cardiovascular:     Rate and Rhythm: Normal rate and regular rhythm.     Heart sounds: No murmur heard. Pulmonary:     Effort: Pulmonary effort is normal. No respiratory distress.     Breath sounds: Normal breath sounds. No wheezing, rhonchi or rales.  Abdominal:     General: Abdomen is flat. Bowel sounds are normal.  Musculoskeletal:  Cervical back: Normal range of motion and neck supple.  Skin:    General: Skin is warm and dry.     Capillary Refill: Capillary refill takes [name] than 2 seconds.  Neurological:     General: No focal deficit present.     Mental Status: He is alert and oriented to person, place, and time.  Psychiatric:        Mood and Affect: Mood normal.        Behavior: Behavior normal.        Thought Content: Thought content normal.        Judgment: Judgment normal.     Results for orders placed or performed in visit on 05/27/22  CoaguChek XS/INR Waived (STAT)  Result  Value Ref Range   INR 2.4 (H) 0.9 - 1.1   Prothrombin Time 28.3 sec      Assessment & Plan:   Problem List Items Addressed This Visit   None    Follow up plan: No follow-ups on file.

## 2022-07-24 NOTE — Assessment & Plan Note (Signed)
Chronic,  3.1 today which is within goal.  Goal INR 2.0-3.0.  Continue -5 mg of Coumadin on Monday, Wednesday, Friday Saturday and Sunday & 2.5 mg of Coumadin on Tuesday and Thursday.  Follow-up INR in 2 weeks.  Decrease green leafy vegetables.  Follow up in 2 weeks.  Will adjust dose if needed at that time.  Bleeding precautions discussed with patient at visit today.

## 2022-07-24 NOTE — Progress Notes (Signed)
Results discussed with patient during visit.

## 2022-08-17 ENCOUNTER — Telehealth: Payer: Self-pay

## 2022-08-17 NOTE — Telephone Encounter (Signed)
Called and LVM asking for patient to please return my call. Patient is due for AWV, has appointment with Santiago Glad 08/19/22 at 2:00 pm. Patient can come in to do AWV at 1:30 before he sees Santiago Glad or I can do it after, around 2:30 if he would like.   OK for PEC to speak to patient and find out the above information if the patient calls back.

## 2022-08-19 ENCOUNTER — Ambulatory Visit (INDEPENDENT_AMBULATORY_CARE_PROVIDER_SITE_OTHER): Payer: Medicare Other | Admitting: Nurse Practitioner

## 2022-08-19 ENCOUNTER — Encounter: Payer: Self-pay | Admitting: Nurse Practitioner

## 2022-08-19 VITALS — BP 142/76 | HR 60 | Temp 97.8°F | Wt 169.1 lb

## 2022-08-19 DIAGNOSIS — Z85828 Personal history of other malignant neoplasm of skin: Secondary | ICD-10-CM

## 2022-08-19 DIAGNOSIS — Z7901 Long term (current) use of anticoagulants: Secondary | ICD-10-CM

## 2022-08-19 DIAGNOSIS — Z9889 Other specified postprocedural states: Secondary | ICD-10-CM | POA: Diagnosis not present

## 2022-08-19 DIAGNOSIS — D6859 Other primary thrombophilia: Secondary | ICD-10-CM | POA: Diagnosis not present

## 2022-08-19 DIAGNOSIS — D649 Anemia, unspecified: Secondary | ICD-10-CM | POA: Diagnosis not present

## 2022-08-19 DIAGNOSIS — I10 Essential (primary) hypertension: Secondary | ICD-10-CM

## 2022-08-19 DIAGNOSIS — R7301 Impaired fasting glucose: Secondary | ICD-10-CM | POA: Diagnosis not present

## 2022-08-19 DIAGNOSIS — E785 Hyperlipidemia, unspecified: Secondary | ICD-10-CM | POA: Diagnosis not present

## 2022-08-19 LAB — COAGUCHEK XS/INR WAIVED
INR: 3.4 — ABNORMAL HIGH (ref 0.9–1.1)
Prothrombin Time: 41.3 s

## 2022-08-19 NOTE — Assessment & Plan Note (Signed)
Labs ordered at visit today.  Will make recommendations based on lab results.   

## 2022-08-19 NOTE — Assessment & Plan Note (Signed)
Chronic,  3.1 today which is within goal.  Goal INR 2.0-3.0.  Continue -5 mg of Coumadin on Monday, Wednesday, Friday, and Sunday & 2.5 mg of Coumadin on Tuesday and Thursday and Saturday.  Follow-up INR in 2 weeks.  Decrease green leafy vegetables.  Follow up in 2 weeks.  Will adjust dose if needed at that time.  Bleeding precautions discussed with patient at visit today.

## 2022-08-19 NOTE — Progress Notes (Signed)
BP (!) 142/76   Pulse 60   Temp 97.8 F (36.6 C) (Oral)   Wt 169 lb 1.6 oz (76.7 kg)   SpO2 98%   BMI 22.93 kg/m    Subjective:    Patient ID: Thomas [name], male    DOB: Aug 04, 1946, 76 y.o.   MRN: [id number]  HPI: Thomas [name] is a 76 y.o. male  Chief Complaint  Patient presents with   Coagulation Disorder    2 week follow up    This patient is a 76 y.o. male who presents for coumadin management. His last INR was 2.5.  He is here for a 1 month visit to ensure he is back within range.  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  Patient denies any recent bleeding.  Denies night sweats, fever, fatigue, or weight loss.  COUMADIN CHECK INR Today: 2.5 Present Coumadin dose: 2.5 mg of Coumadin on Monday, Wednesday, Friday & Sunday 5 mg of Coumadin on Tuesday, Thursday, Saturday Goal: 2.0-3.0 Excessive bruising: no Nose bleeding: no Rectal bleeding: no Prolonged menstrual cycles: N/A Eating diet with consistent amounts of foods containing Vitamin K:yes Any recent antibiotic use? no Relevant past medical, surgical, family and social history reviewed and updated as indicated. Interim medical history since our last visit reviewed. Allergies and medications reviewed and updated.  HYPERTENSION Hypertension status: controlled  Satisfied with current treatment? no Duration of hypertension: years BP monitoring frequency:  not checking BP range:  BP medication side effects:  no Medication compliance: excellent compliance Previous BP meds:amlodipine Aspirin:  Coumadin Recurrent headaches: no Visual changes: no Palpitations: no Dyspnea: no Chest pain: no Lower extremity edema: no Dizzy/lightheaded: no   Review of Systems  Constitutional:  Negative for chills, diaphoresis, fatigue, fever and unexpected weight change.  Eyes:  Negative for visual  disturbance.  Respiratory:  Negative for shortness of breath.   Cardiovascular:  Negative for chest pain and leg swelling.  Neurological:  Negative for light-headedness and headaches.    Per HPI unless specifically indicated above     Objective:    BP (!) 142/76   Pulse 60   Temp 97.8 F (36.6 C) (Oral)   Wt 169 lb 1.6 oz (76.7 kg)   SpO2 98%   BMI 22.93 kg/m   Wt Readings from Last 3 Encounters:  08/19/22 169 lb 1.6 oz (76.7 kg)  07/24/22 169 lb 4.8 oz (76.8 kg)  05/27/22 169 lb 12.8 oz (77 kg)    Physical Exam Vitals and nursing note reviewed.  Constitutional:      General: He is not in acute distress.    Appearance: Normal appearance. He is not ill-appearing, toxic-appearing or diaphoretic.  HENT:     Head: Normocephalic.      Right Ear: External ear normal.     Left Ear: External ear normal.     Nose: No rhinorrhea.     Mouth/Throat:     Mouth: Mucous membranes are moist.  Eyes:     General:        Right eye: No discharge.        Left eye: No discharge.     Extraocular Movements: Extraocular movements intact.     Conjunctiva/sclera: Conjunctivae normal.     Pupils: Pupils are equal, round, and reactive to light.  Cardiovascular:     Rate and Rhythm: Normal rate and regular rhythm.  Heart sounds: No murmur heard. Pulmonary:     Effort: Pulmonary effort is normal. No respiratory distress.     Breath sounds: Normal breath sounds. No wheezing, rhonchi or rales.  Abdominal:     General: Abdomen is flat. Bowel sounds are normal.  Musculoskeletal:     Cervical back: Normal range of motion and neck supple.  Skin:    General: Skin is warm and dry.     Capillary Refill: Capillary refill takes [name] than 2 seconds.  Neurological:     General: No focal deficit present.     Mental Status: He is alert and oriented to person, place, and time.  Psychiatric:        Mood and Affect: Mood normal.        Behavior: Behavior normal.        Thought Content: Thought  content normal.        Judgment: Judgment normal.     Results for orders placed or performed in visit on 08/19/22  CoaguChek XS/INR Waived (STAT)  Result Value Ref Range   INR 3.4 (H) 0.9 - 1.1   Prothrombin Time 41.3 sec      Assessment & Plan:   Problem List Items Addressed This Visit       Cardiovascular and Mediastinum   Essential hypertension    Chronic.  Controlled.  Continue with current medication regimen.  Labs ordered today.  Return to clinic in 3 months for reevaluation.  Call sooner if concerns arise.        Relevant Orders   Comp Met (CMET)     Endocrine   Impaired fasting glucose    Labs ordered at visit today.  Will make recommendations based on lab results.        Relevant Orders   HgB A1c     Hematopoietic and Hemostatic   Antithrombin 3 deficiency (HCC) - Primary    Chronic,  3.1 today which is within goal.  Goal INR 2.0-3.0.  Continue -5 mg of Coumadin on Monday, Wednesday, Friday, and Sunday & 2.5 mg of Coumadin on Tuesday and Thursday and Saturday.  Follow-up INR in 2 weeks.  Decrease green leafy vegetables.  Follow up in 2 weeks.  Will adjust dose if needed at that time.  Bleeding precautions discussed with patient at visit today.       Relevant Orders   CBC w/Diff   CoaguChek XS/INR Waived (STAT) (Completed)     Other   Hyperlipidemia    Labs ordered at visit today.  Will make recommendations based on lab results.        Relevant Orders   Lipid Profile   History of basal cell carcinoma (BCC) excision    Left ear wound. Does not want to follow up with Dermatology.       Encounter for current long-term use of anticoagulants    Chronic,  3.1 today which is within goal.  Goal INR 2.0-3.0.  Continue -5 mg of Coumadin on Monday, Wednesday, Friday, and Sunday & 2.5 mg of Coumadin on Tuesday and Thursday and Saturday.  Follow-up INR in 2 weeks.  Decrease green leafy vegetables.  Follow up in 2 weeks.  Will adjust dose if needed at that time.   Bleeding precautions discussed with patient at visit today.         Follow up plan: Return in about 2 weeks (around 09/02/2022) for Coag check.

## 2022-08-19 NOTE — Assessment & Plan Note (Signed)
Left ear wound. Does not want to follow up with Dermatology.

## 2022-08-19 NOTE — Assessment & Plan Note (Signed)
Chronic.  Controlled.  Continue with current medication regimen.  Labs ordered today.  Return to clinic in 3 months for reevaluation.  Call sooner if concerns arise.   

## 2022-08-19 NOTE — Progress Notes (Signed)
Results discussed with patient during visit.

## 2022-08-20 LAB — CBC WITH DIFFERENTIAL/PLATELET
Basophils Absolute: 0.1 10*3/uL (ref 0.0–0.2)
Basos: 1 %
EOS (ABSOLUTE): 0.3 10*3/uL (ref 0.0–0.4)
Eos: 4 %
Hematocrit: 32.2 % — ABNORMAL LOW (ref 37.5–51.0)
Hemoglobin: 8.8 g/dL — ABNORMAL LOW (ref 13.0–17.7)
Immature Grans (Abs): 0 10*3/uL (ref 0.0–0.1)
Immature Granulocytes: 0 %
Lymphocytes Absolute: 2.3 10*3/uL (ref 0.7–3.1)
Lymphs: 33 %
MCH: 19.3 pg — ABNORMAL LOW (ref 26.6–33.0)
MCHC: 27.3 g/dL — ABNORMAL LOW (ref 31.5–35.7)
MCV: 71 fL — ABNORMAL LOW (ref 79–97)
Monocytes Absolute: 0.4 10*3/uL (ref 0.1–0.9)
Monocytes: 6 %
Neutrophils Absolute: 3.9 10*3/uL (ref 1.4–7.0)
Neutrophils: 56 %
Platelets: 267 10*3/uL (ref 150–450)
RBC: 4.55 x10E6/uL (ref 4.14–5.80)
RDW: 15.6 % — ABNORMAL HIGH (ref 11.6–15.4)
WBC: 7 10*3/uL (ref 3.4–10.8)

## 2022-08-20 LAB — COMPREHENSIVE METABOLIC PANEL
ALT: 4 IU/L (ref 0–44)
AST: 12 IU/L (ref 0–40)
Albumin/Globulin Ratio: 1.7 (ref 1.2–2.2)
Albumin: 4.5 g/dL (ref 3.8–4.8)
Alkaline Phosphatase: 66 IU/L (ref 44–121)
BUN/Creatinine Ratio: 9 — ABNORMAL LOW (ref 10–24)
BUN: 9 mg/dL (ref 8–27)
Bilirubin Total: 0.4 mg/dL (ref 0.0–1.2)
CO2: 22 mmol/L (ref 20–29)
Calcium: 9.1 mg/dL (ref 8.6–10.2)
Chloride: 101 mmol/L (ref 96–106)
Creatinine, Ser: 0.98 mg/dL (ref 0.76–1.27)
Globulin, Total: 2.7 g/dL (ref 1.5–4.5)
Glucose: 92 mg/dL (ref 70–99)
Potassium: 4 mmol/L (ref 3.5–5.2)
Sodium: 138 mmol/L (ref 134–144)
Total Protein: 7.2 g/dL (ref 6.0–8.5)
eGFR: 80 mL/min/{1.73_m2} (ref >59)

## 2022-08-20 LAB — LIPID PANEL
Chol/HDL Ratio: 2.2 ratio (ref 0.0–5.0)
Cholesterol, Total: 101 mg/dL (ref 100–199)
HDL: 45 mg/dL (ref >39)
LDL Chol Calc (NIH): 44 mg/dL (ref 0–99)
Triglycerides: 49 mg/dL (ref 0–149)
VLDL Cholesterol Cal: 12 mg/dL (ref 5–40)

## 2022-08-20 LAB — HEMOGLOBIN A1C
Est. average glucose Bld gHb Est-mCnc: 120 mg/dL
Hgb A1c MFr Bld: 5.8 % — ABNORMAL HIGH (ref 4.8–5.6)

## 2022-08-20 NOTE — Addendum Note (Signed)
Addended by: Jon Billings on: 08/20/2022 07:59 AM   Modules accepted: Orders

## 2022-08-20 NOTE — Progress Notes (Signed)
Please let patient know that his lab work shows that his blood counts are lower than before.  I would like him to come back and get a stool test to see if there is blood in his stool.  Other lab work looks great.  No other concerns at this time.

## 2022-08-30 NOTE — Progress Notes (Deleted)
There were no vitals taken for this visit.   Subjective:    Patient ID: Thomas [name], male    DOB: 12-08-1945, 76 y.o.   MRN: [id number]  HPI: Thomas [name] is a 76 y.o. male presenting on 09/01/2022 for comprehensive medical examination. Current medical complaints include:{Blank single:19197::"none","***"}  He currently lives with: Interim Problems from his last visit: {Blank single:19197::"yes","no"}  Functional Status Survey:    FALL RISK:    08/19/2022    1:50 PM 07/24/2022    1:57 PM 05/27/2022    1:12 PM 04/23/2022    1:32 PM 03/23/2022    1:37 PM  Fall Risk   Falls in the past year? 0 0 0 0 0  Number falls in past yr: 0 0 0 0 0  Injury with Fall? 0 0 0 0 0  Risk for fall due to : _0   Follow up _1     Depression Screen    08/19/2022    1:50 PM 07/24/2022    1:57 PM 05/27/2022    1:12 PM 04/23/2022    1:32 PM 03/23/2022    1:37 PM  Depression screen PHQ 2/9  Decreased Interest 0 0 0 0 0  Down, Depressed, Hopeless 0 0 0 0 0  PHQ - 2 Score 0 0 0 0 0  Altered sleeping 0 0 0 0 0  Tired, decreased energy 0 0 0 0 0  Change in appetite 0 0 0 0 0  Feeling bad or failure about yourself  0 0 0 0 0  Trouble concentrating 0 0 0 0 0  Moving slowly or fidgety/restless 0 0 0 0 0  Suicidal thoughts 0 0 0 0 0  PHQ-9 Score 0 0 0 0 0  Difficult doing work/chores Not difficult at all Not difficult at all Not difficult at all Not difficult at all Not difficult at all    Advanced Directives <no information>  Past Medical History:  Past Medical History:  Diagnosis Date  . Clotting disorder ([place])   . Hyperlipidemia   . Hypertension   . Sinus infection   . Tinnitus     Surgical History:  Past Surgical History:  Procedure Laterality Date  . CATARACT EXTRACTION Bilateral 1998,  2007  . MOHS SURGERY Left 09/08/2018   Mohs surgery Ear Dr.  Leory Plowman [facility], Dr. Lynnell Jude  . TONSILLECTOMY  1956    Medications:  Current Outpatient Medications on File Prior to Visit  Medication Sig  . amLODipine (NORVASC) 2.5 MG tablet Take 1 tablet (2.5 mg total) by mouth daily.  Marland Kitchen atorvastatin (LIPITOR) 10 MG tablet Take 1 tablet (10 mg total) by mouth daily.  . diphenhydrAMINE-PE-APAP (EQ SEVERE ALLERGY & SINUS) 25-5-325 MG TABS Take 1 tablet by mouth as needed.  . Pseudoeph-Doxylamine-DM-APAP (DAYQUIL/NYQUIL COLD/FLU RELIEF PO) Take by mouth as needed.  . warfarin (COUMADIN) 5 MG tablet Take 1 tablet (5 mg total) by mouth daily.   No current facility-administered medications on file prior to visit.    Allergies:  Allergies  Allergen Reactions  . Dexamethasone Other (See Comments)    Renal Insufficiency    Social History:  Social History   Socioeconomic History  . Marital status: Single    Spouse name: Not on file  . Number of children: 0  . Years of education: Not on  file  . Highest education level: Not on file  Occupational History  . Occupation: Retried  Tobacco Use  . Smoking status: Former    Packs/day: 1.00    Types: Cigarettes    Quit date: 2002    Years since quitting: 21.7  . Smokeless tobacco: Never  . Tobacco comments:    started smokng at age 27  Vaping Use  . Vaping Use: Never used  Substance and Sexual Activity  . Alcohol use: Not Currently    Alcohol/week: 0.0 standard drinks of alcohol    Comment: rare  . Drug use: No  . Sexual activity: Not Currently  Other Topics Concern  . Not on file  Social History Narrative  . Not on file   Social Determinants of Health   Financial Resource Strain: Low Risk  (01/31/2021)   Overall Financial Resource Strain (CARDIA)   . Difficulty of Paying Living Expenses: Not hard at all  Food Insecurity: No Food Insecurity (01/31/2021)   Hunger Vital Sign   . Worried About Charity fundraiser in the Last  Year: Never true   . Ran Out of Food in the Last Year: Never true  Transportation Needs: No Transportation Needs (01/31/2021)   PRAPARE - Transportation   . Lack of Transportation (Medical): No   . Lack of Transportation (Non-Medical): No  Physical Activity: Inactive (01/31/2021)   Exercise Vital Sign   . Days of Exercise per Week: 0 days   . Minutes of Exercise per Session: 0 min  Stress: No Stress Concern Present (01/31/2021)   [place]   . Feeling of Stress : Not at all  Social Connections: Not on file  Intimate Partner Violence: Not on file   Social History   Tobacco Use  Smoking Status Former  . Packs/day: 1.00  . Types: Cigarettes  . Quit date: 2002  . Years since quitting: 21.7  Smokeless Tobacco Never  Tobacco Comments   started smokng at age 12   Social History   Substance and Sexual Activity  Alcohol Use Not Currently  . Alcohol/week: 0.0 standard drinks of alcohol   Comment: rare    Family History:  Family History  Problem Relation Age of Onset  . Alzheimer's disease Mother   . Heart attack Mother   . Heart attack Father   . Cancer Sister   . Diabetes Sister   . Diabetes Sister     Past medical history, surgical history, medications, allergies, family history and social history reviewed with patient today and changes made to appropriate areas of the chart.   ROS All other ROS negative except what is listed above and in the HPI.      Objective:    There were no vitals taken for this visit.  Wt Readings from Last 3 Encounters:  08/19/22 169 lb 1.6 oz (76.7 kg)  07/24/22 169 lb 4.8 oz (76.8 kg)  05/27/22 169 lb 12.8 oz (77 kg)    No results found.  Physical Exam     01/31/2021    1:50 PM  6CIT Screen  What Year? 0 points  What month? 0 points  What time? 0 points  Count back from 20 0 points  Months in reverse 2 points  Repeat phrase 4 points  Total Score 6 points     Cognitive Testing - 6-CIT  Correct? Score   What year is it? {YES NO:22349} {Numbers; 0-4:31231} Yes = 0    No =  4  What month is it? {YES NO:22349} {Numbers; 0-4:31231} Yes = 0    No = 3  Remember:     Pia [address], [place], [place]     What time is it? {YES NO:22349} {Numbers; 0-4:31231} Yes = 0    No = 3  Count backwards from 20 to 1 {YES NO:22349} {Numbers; 0-4:31231} Correct = 0    1 error = 2   More than 1 error = 4  Say the months of the year in reverse. {YES NO:22349} {Numbers; 0-4:31231} Correct = 0    1 error = 2   More than 1 error = 4  What address did I ask you to remember? {YES NO:22349} {NUMBERS; 0-10:5044} Correct = 0  1 error = 2    2 error = 4    3 error = 6    4 error = 8    All wrong = 10       TOTAL SCORE  {Numbers; 9-98:33825}/05   Interpretation:  {Desc; normal/abnormal:11317::"Normal"}  Normal (0-7) Abnormal (8-28)    Results for orders placed or performed in visit on 08/19/22  Comp Met (CMET)  Result Value Ref Range   Glucose 92 70 - 99 mg/dL   BUN 9 8 - 27 mg/dL   Creatinine, Ser 0.98 0.76 - 1.27 mg/dL   eGFR 80 >59 mL/min/1.73   BUN/Creatinine Ratio 9 (L) 10 - 24   Sodium 138 134 - 144 mmol/L   Potassium 4.0 3.5 - 5.2 mmol/L   Chloride 101 96 - 106 mmol/L   CO2 22 20 - 29 mmol/L   Calcium 9.1 8.6 - 10.2 mg/dL   Total Protein 7.2 6.0 - 8.5 g/dL   Albumin 4.5 3.8 - 4.8 g/dL   Globulin, Total 2.7 1.5 - 4.5 g/dL   Albumin/Globulin Ratio 1.7 1.2 - 2.2   Bilirubin Total 0.4 0.0 - 1.2 mg/dL   Alkaline Phosphatase 66 44 - 121 IU/L   AST 12 0 - 40 IU/L   ALT 4 0 - 44 IU/L  Lipid Profile  Result Value Ref Range   Cholesterol, Total 101 100 - 199 mg/dL   Triglycerides 49 0 - 149 mg/dL   HDL 45 >39 mg/dL   VLDL Cholesterol Cal 12 5 - 40 mg/dL   LDL Chol Calc (NIH) 44 0 - 99 mg/dL   Chol/HDL Ratio 2.2 0.0 - 5.0 ratio  HgB A1c  Result Value Ref Range   Hgb A1c MFr Bld 5.8 (H) 4.8 - 5.6 %   Est. average glucose Bld gHb Est-mCnc 120 mg/dL  CBC  w/Diff  Result Value Ref Range   WBC 7.0 3.4 - 10.8 x10E3/uL   RBC 4.55 4.14 - 5.80 x10E6/uL   Hemoglobin 8.8 (L) 13.0 - 17.7 g/dL   Hematocrit 32.2 (L) 37.5 - 51.0 %   MCV 71 (L) 79 - 97 fL   MCH 19.3 (L) 26.6 - 33.0 pg   MCHC 27.3 (L) 31.5 - 35.7 g/dL   RDW 15.6 (H) 11.6 - 15.4 %   Platelets 267 150 - 450 x10E3/uL   Neutrophils 56 Not Estab. %   Lymphs 33 Not Estab. %   Monocytes 6 Not Estab. %   Eos 4 Not Estab. %   Basos 1 Not Estab. %   Neutrophils Absolute 3.9 1.4 - 7.0 x10E3/uL   Lymphocytes Absolute 2.3 0.7 - 3.1 x10E3/uL   Monocytes Absolute 0.4 0.1 - 0.9 x10E3/uL   EOS (ABSOLUTE) 0.3 0.0 -  0.4 x10E3/uL   Basophils Absolute 0.1 0.0 - 0.2 x10E3/uL   Immature Granulocytes 0 Not Estab. %   Immature Grans (Abs) 0.0 0.0 - 0.1 x10E3/uL  CoaguChek XS/INR Waived (STAT)  Result Value Ref Range   INR 3.4 (H) 0.9 - 1.1   Prothrombin Time 41.3 sec      Assessment & Plan:   Problem List Items Addressed This Visit   None    Preventative Services:  Health Risk Assessment and Personalized Prevention Plan: Bone Mass Measurements: CVD Screening:  Colon Cancer Screening:  Depression Screening:  Diabetes Screening:  Glaucoma Screening:  Hepatitis B vaccine: Hepatitis C screening:  HIV Screening: Flu Vaccine: Lung cancer Screening: Obesity Screening:  Pneumonia Vaccines (2): STI Screening: PSA screening:  Discussed aspirin prophylaxis for myocardial infarction prevention and decision was {Blank single:19197::"it was not indicated","made to continue ASA","made to start ASA","made to stop ASA","that we recommended ASA, and patient refused"}  LABORATORY TESTING:  Health maintenance labs ordered today as discussed above.   The natural history of prostate cancer and ongoing controversy regarding screening and potential treatment outcomes of prostate cancer has been discussed with the patient. The meaning of a false positive PSA and a false negative PSA has been discussed. He  indicates understanding of the limitations of this screening test and wishes *** to proceed with screening PSA testing.   IMMUNIZATIONS:   - Tdap: Tetanus vaccination status reviewed: {tetanus status:315746}. - Influenza: {Blank single:19197::"Up to date","Administered today","Postponed to flu season","Refused","Given elsewhere"} - Pneumovax: {Blank single:19197::"Up to date","Administered today","Not applicable","Refused","Given elsewhere"} - Prevnar: {Blank single:19197::"Up to date","Administered today","Not applicable","Refused","Given elsewhere"} - Zostavax vaccine: {Blank single:19197::"Up to date","Administered today","Not applicable","Refused","Given elsewhere"}  SCREENING: - Colonoscopy: {Blank single:19197::"Up to date","Ordered today","Not applicable","Refused","Done elsewhere"}  Discussed with patient purpose of the colonoscopy is to detect colon cancer at curable precancerous or early stages   - AAA Screening: {Blank single:19197::"Up to date","Ordered today","Not applicable","Refused","Done elsewhere"}  -Hearing Test: {Blank single:19197::"Up to date","Ordered today","Not applicable","Refused","Done elsewhere"}  -Spirometry: {Blank single:19197::"Up to date","Ordered today","Not applicable","Refused","Done elsewhere"}   PATIENT COUNSELING:    Sexuality: Discussed sexually transmitted diseases, partner selection, use of condoms, avoidance of unintended pregnancy  and contraceptive alternatives.   Advised to avoid cigarette smoking.  I discussed with the patient that most people either abstain from alcohol or drink within safe limits (<=14/week and <=4 drinks/occasion for males, <=7/weeks and <= 3 drinks/occasion for females) and that the risk for alcohol disorders and other health effects rises proportionally with the number of drinks per week and how often a drinker exceeds daily limits.  Discussed cessation/primary prevention of drug use and availability of treatment for  abuse.   Diet: Encouraged to adjust caloric intake to maintain  or achieve ideal body weight, to reduce intake of dietary saturated fat and total fat, to limit sodium intake by avoiding high sodium foods and not adding table salt, and to maintain adequate dietary potassium and calcium preferably from fresh fruits, vegetables, and low-fat dairy products.    stressed the importance of regular exercise  Injury prevention: Discussed safety belts, safety helmets, smoke detector, smoking near bedding or upholstery.   Dental health: Discussed importance of regular tooth brushing, flossing, and dental visits.   Follow up plan: NEXT PREVENTATIVE PHYSICAL DUE IN 1 YEAR. No follow-ups on file.

## 2022-09-01 ENCOUNTER — Ambulatory Visit (INDEPENDENT_AMBULATORY_CARE_PROVIDER_SITE_OTHER): Payer: Medicare Other | Admitting: Nurse Practitioner

## 2022-09-01 ENCOUNTER — Encounter: Payer: Self-pay | Admitting: Nurse Practitioner

## 2022-09-01 VITALS — BP 129/74 | HR 66 | Temp 98.4°F | Wt 170.5 lb

## 2022-09-01 DIAGNOSIS — D649 Anemia, unspecified: Secondary | ICD-10-CM

## 2022-09-01 DIAGNOSIS — D6859 Other primary thrombophilia: Secondary | ICD-10-CM | POA: Diagnosis not present

## 2022-09-01 LAB — COAGUCHEK XS/INR WAIVED
INR: 2.6 — ABNORMAL HIGH (ref 0.9–1.1)
Prothrombin Time: 31.5 s

## 2022-09-01 NOTE — Assessment & Plan Note (Signed)
Chronic,  2.6 today which is within goal.  Goal INR 2.0-3.0.  Continue -5 mg of Coumadin on Monday, Wednesday, Friday, and Sunday & 2.5 mg of Coumadin on Tuesday and Thursday and Saturday.  Follow-up INR in 2 weeks.  Decrease green leafy vegetables.  Follow up in 2 weeks.  Will adjust dose if needed at that time.  Bleeding precautions discussed with patient at visit today.

## 2022-09-01 NOTE — Progress Notes (Signed)
BP 129/74   Pulse 66   Temp 98.4 F (36.9 C) (Oral)   Wt 170 lb 8 oz (77.3 kg)   SpO2 97%   BMI 23.12 kg/m    Subjective:    Patient ID: Thomas [name], male    DOB: December 07, 1945, 76 y.o.   MRN: [id number]  HPI: Thomas [name] is a 76 y.o. male  Chief Complaint  Patient presents with   Coagulation Disorder    2 week follow up    This patient is a 76 y.o. male who presents for coumadin management. His last INR was 2.6.  He is here for a 1 month visit to ensure he is back within range.  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  Patient denies any recent bleeding.  Denies night sweats, fever, fatigue, or weight loss.  COUMADIN CHECK INR Today: 2.6 Present Coumadin dose: 2.5 mg of Coumadin on Monday, Wednesday, Friday & Sunday 5 mg of Coumadin on Tuesday, Thursday, Saturday Goal: 2.0-3.0 Excessive bruising: no Nose bleeding: no Rectal bleeding: no Prolonged menstrual cycles: N/A Eating diet with consistent amounts of foods containing Vitamin K:yes Any recent antibiotic use? no  ANEMIA Anemia status: uncontrolled Etiology of anemia: unknown Duration of anemia treatment: years Compliance with treatment: poor compliance Iron supplementation side effects: no Severity of anemia: mild Fatigue: no Decreased exercise tolerance: no  Dyspnea on exertion: no Palpitations: no Bleeding: no Pica: no   Relevant past medical, surgical, family and social history reviewed and updated as indicated. Interim medical history since our last visit reviewed. Allergies and medications reviewed and updated.  Review of Systems  Constitutional:  Negative for chills, diaphoresis, fatigue, fever and unexpected weight change.  Eyes:  Negative for visual disturbance.  Respiratory:  Negative for shortness of breath.   Cardiovascular:  Negative for chest pain and leg  swelling.  Neurological:  Negative for light-headedness and headaches.    Per HPI unless specifically indicated above     Objective:    BP 129/74   Pulse 66   Temp 98.4 F (36.9 C) (Oral)   Wt 170 lb 8 oz (77.3 kg)   SpO2 97%   BMI 23.12 kg/m   Wt Readings from Last 3 Encounters:  09/01/22 170 lb 8 oz (77.3 kg)  08/19/22 169 lb 1.6 oz (76.7 kg)  07/24/22 169 lb 4.8 oz (76.8 kg)    Physical Exam Vitals and nursing note reviewed.  Constitutional:      General: He is not in acute distress.    Appearance: Normal appearance. He is not ill-appearing, toxic-appearing or diaphoretic.  HENT:     Head: Normocephalic.      Right Ear: External ear normal.     Left Ear: External ear normal.     Nose: No rhinorrhea.     Mouth/Throat:     Mouth: Mucous membranes are moist.  Eyes:     General:        Right eye: No discharge.        Left eye: No discharge.     Extraocular Movements: Extraocular movements intact.     Conjunctiva/sclera: Conjunctivae normal.     Pupils: Pupils are equal, round, and reactive to light.  Cardiovascular:     Rate and Rhythm: Normal rate and regular rhythm.     Heart sounds: No murmur heard. Pulmonary:     Effort: Pulmonary effort  is normal. No respiratory distress.     Breath sounds: Normal breath sounds. No wheezing, rhonchi or rales.  Abdominal:     General: Abdomen is flat. Bowel sounds are normal.  Musculoskeletal:     Cervical back: Normal range of motion and neck supple.  Skin:    General: Skin is warm and dry.     Capillary Refill: Capillary refill takes [name] than 2 seconds.  Neurological:     General: No focal deficit present.     Mental Status: He is alert and oriented to person, place, and time.  Psychiatric:        Mood and Affect: Mood normal.        Behavior: Behavior normal.        Thought Content: Thought content normal.        Judgment: Judgment normal.     Results for orders placed or performed in visit on 08/19/22  Comp  Met (CMET)  Result Value Ref Range   Glucose 92 70 - 99 mg/dL   BUN 9 8 - 27 mg/dL   Creatinine, Ser 0.98 0.76 - 1.27 mg/dL   eGFR 80 >59 mL/min/1.73   BUN/Creatinine Ratio 9 (L) 10 - 24   Sodium 138 134 - 144 mmol/L   Potassium 4.0 3.5 - 5.2 mmol/L   Chloride 101 96 - 106 mmol/L   CO2 22 20 - 29 mmol/L   Calcium 9.1 8.6 - 10.2 mg/dL   Total Protein 7.2 6.0 - 8.5 g/dL   Albumin 4.5 3.8 - 4.8 g/dL   Globulin, Total 2.7 1.5 - 4.5 g/dL   Albumin/Globulin Ratio 1.7 1.2 - 2.2   Bilirubin Total 0.4 0.0 - 1.2 mg/dL   Alkaline Phosphatase 66 44 - 121 IU/L   AST 12 0 - 40 IU/L   ALT 4 0 - 44 IU/L  Lipid Profile  Result Value Ref Range   Cholesterol, Total 101 100 - 199 mg/dL   Triglycerides 49 0 - 149 mg/dL   HDL 45 >39 mg/dL   VLDL Cholesterol Cal 12 5 - 40 mg/dL   LDL Chol Calc (NIH) 44 0 - 99 mg/dL   Chol/HDL Ratio 2.2 0.0 - 5.0 ratio  HgB A1c  Result Value Ref Range   Hgb A1c MFr Bld 5.8 (H) 4.8 - 5.6 %   Est. average glucose Bld gHb Est-mCnc 120 mg/dL  CBC w/Diff  Result Value Ref Range   WBC 7.0 3.4 - 10.8 x10E3/uL   RBC 4.55 4.14 - 5.80 x10E6/uL   Hemoglobin 8.8 (L) 13.0 - 17.7 g/dL   Hematocrit 32.2 (L) 37.5 - 51.0 %   MCV 71 (L) 79 - 97 fL   MCH 19.3 (L) 26.6 - 33.0 pg   MCHC 27.3 (L) 31.5 - 35.7 g/dL   RDW 15.6 (H) 11.6 - 15.4 %   Platelets 267 150 - 450 x10E3/uL   Neutrophils 56 Not Estab. %   Lymphs 33 Not Estab. %   Monocytes 6 Not Estab. %   Eos 4 Not Estab. %   Basos 1 Not Estab. %   Neutrophils Absolute 3.9 1.4 - 7.0 x10E3/uL   Lymphocytes Absolute 2.3 0.7 - 3.1 x10E3/uL   Monocytes Absolute 0.4 0.1 - 0.9 x10E3/uL   EOS (ABSOLUTE) 0.3 0.0 - 0.4 x10E3/uL   Basophils Absolute 0.1 0.0 - 0.2 x10E3/uL   Immature Granulocytes 0 Not Estab. %   Immature Grans (Abs) 0.0 0.0 - 0.1 x10E3/uL  CoaguChek XS/INR Waived (STAT)  Result Value  Ref Range   INR 3.4 (H) 0.9 - 1.1   Prothrombin Time 41.3 sec      Assessment & Plan:   Problem List Items Addressed This  Visit       Hematopoietic and Hemostatic   Antithrombin 3 deficiency (HCC) - Primary    Chronic,  2.6 today which is within goal.  Goal INR 2.0-3.0.  Continue -5 mg of Coumadin on Monday, Wednesday, Friday, and Sunday & 2.5 mg of Coumadin on Tuesday and Thursday and Saturday.  Follow-up INR in 2 weeks.  Decrease green leafy vegetables.  Follow up in 2 weeks.  Will adjust dose if needed at that time.  Bleeding precautions discussed with patient at visit today.       Relevant Orders   CoaguChek XS/INR Waived (STAT)     Other   Anemia    Chronic. Hemoglobin 8.8 two weeks ago.  States he does not want to know the cause of the bleeding. Does not want treatment including seeing GI or Hematology.  He is okay with whatever happens because of this.         Follow up plan: Return in about 1 month (around 10/02/2022) for Coag check ([place]).

## 2022-09-01 NOTE — Progress Notes (Signed)
Results discussed with patient during visit.

## 2022-09-01 NOTE — Assessment & Plan Note (Signed)
Chronic. Hemoglobin 8.8 two weeks ago.  States he does not want to know the cause of the bleeding. Does not want treatment including seeing GI or Hematology.  He is okay with whatever happens because of this.

## 2022-10-05 ENCOUNTER — Encounter: Payer: Self-pay | Admitting: Nurse Practitioner

## 2022-10-05 ENCOUNTER — Ambulatory Visit (INDEPENDENT_AMBULATORY_CARE_PROVIDER_SITE_OTHER): Payer: Medicare Other | Admitting: Nurse Practitioner

## 2022-10-05 VITALS — BP 111/59 | HR 56 | Temp 97.9°F | Wt 167.4 lb

## 2022-10-05 DIAGNOSIS — Z0000 Encounter for general adult medical examination without abnormal findings: Secondary | ICD-10-CM

## 2022-10-05 DIAGNOSIS — Z7901 Long term (current) use of anticoagulants: Secondary | ICD-10-CM

## 2022-10-05 DIAGNOSIS — Z7189 Other specified counseling: Secondary | ICD-10-CM | POA: Diagnosis not present

## 2022-10-05 LAB — COAGUCHEK XS/INR WAIVED
INR: 2.5 — ABNORMAL HIGH (ref 0.9–1.1)
Prothrombin Time: 30.5 s

## 2022-10-05 NOTE — Progress Notes (Signed)
Results discussed with patient during visit.

## 2022-10-05 NOTE — Progress Notes (Signed)
BP (!) 111/59   Pulse (!) 56   Temp 97.9 F (36.6 C) (Oral)   Wt 167 lb 6.4 oz (75.9 kg)   SpO2 96%   BMI 22.70 kg/m    Subjective:    Patient ID: Thomas [name], male    DOB: 10-16-1946, 76 y.o.   MRN: [id number]  HPI: Thomas [name] is a 76 y.o. male presenting on 10/05/2022 for comprehensive medical examination. Current medical complaints include:none  He currently lives with: Interim Problems from his last visit: no   Denies HA, CP, SOB, dizziness, palpitations, visual changes, and lower extremity swelling.   Functional Status Survey:    FALL RISK:    10/05/2022    1:06 PM 09/01/2022    1:20 PM 08/19/2022    1:50 PM 07/24/2022    1:57 PM 05/27/2022    1:12 PM  Fall Risk   Falls in the past year? 0 0 0 0 0  Number falls in past yr: 0 0 0 0 0  Injury with Fall? 0 0 0 0 0  Risk for fall due to : No Fall Risks No Fall Risks No Fall Risks No Fall Risks No Fall Risks  Follow up Falls evaluation completed Falls evaluation completed Falls evaluation completed Falls evaluation completed Falls evaluation completed    Depression Screen    10/05/2022    1:06 PM 09/01/2022    1:20 PM 08/19/2022    1:50 PM 07/24/2022    1:57 PM 05/27/2022    1:12 PM  Depression screen PHQ 2/9  Decreased Interest 0 0 0 0 0  Down, Depressed, Hopeless 0 0 0 0 0  PHQ - 2 Score 0 0 0 0 0  Altered sleeping 0 0 0 0 0  Tired, decreased energy 0 0 0 0 0  Change in appetite 0 0 0 0 0  Feeling bad or failure about yourself  0 0 0 0 0  Trouble concentrating 0 0 0 0 0  Moving slowly or fidgety/restless 0 0 0 0 0  Suicidal thoughts 0 0 0 0 0  PHQ-9 Score 0 0 0 0 0  Difficult doing work/chores Not difficult at all Not difficult at all Not difficult at all Not difficult at all Not difficult at all    Advanced Directives Does not have an advanced care plan.  Does not plan to make one. Declines further information.  Past Medical History:  Past Medical History:  Diagnosis Date   Clotting disorder  ([place])    Hyperlipidemia    Hypertension    Sinus infection    Tinnitus     Surgical History:  Past Surgical History:  Procedure Laterality Date   CATARACT EXTRACTION Bilateral 1998, 2007   MOHS SURGERY Left 09/08/2018   Mohs surgery Ear Dr.  [facility], Dr. Lynnell Jude   TONSILLECTOMY  [phone]    Medications:  Current Outpatient Medications on File Prior to Visit  Medication Sig   amLODipine (NORVASC) 2.5 MG tablet Take 1 tablet (2.5 mg total) by mouth daily.   atorvastatin (LIPITOR) 10 MG tablet Take 1 tablet (10 mg total) by mouth daily.   diphenhydrAMINE-PE-APAP (EQ SEVERE ALLERGY & SINUS) 25-5-325 MG TABS Take 1 tablet by mouth as needed.   Pseudoeph-Doxylamine-DM-APAP (DAYQUIL/NYQUIL COLD/FLU RELIEF PO) Take by mouth as needed.   warfarin (COUMADIN) 5 MG tablet Take 1 tablet (5 mg total) by mouth daily.   No current facility-administered medications on file prior to visit.  Allergies:  Allergies  Allergen Reactions   Dexamethasone Other (See Comments)    Renal Insufficiency    Social History:  Social History   Socioeconomic History   Marital status: Single    Spouse name: Not on file   Number of children: 0   Years of education: Not on file   Highest education level: Not on file  Occupational History   Occupation: Retried  Tobacco Use   Smoking status: Former    Packs/day: 1.00    Types: Cigarettes    Quit date: 2002    Years since quitting: 21.8   Smokeless tobacco: Never   Tobacco comments:    started smokng at age 62  Vaping Use   Vaping Use: Never used  Substance and Sexual Activity   Alcohol use: Not Currently    Alcohol/week: 0.0 standard drinks of alcohol    Comment: rare   Drug use: No   Sexual activity: Not Currently  Other Topics Concern   Not on file  Social History Narrative   Not on file   Social Determinants of Health   Financial Resource Strain: Low Risk  (01/31/2021)   Overall Financial Resource Strain (CARDIA)     Difficulty of Paying Living Expenses: Not hard at all  Food Insecurity: No Food Insecurity (01/31/2021)   Hunger Vital Sign    Worried About Running Out of Food in the Last Year: Never true    [place] in the Last Year: Never true  Transportation Needs: No Transportation Needs (01/31/2021)   PRAPARE - Hydrologist (Medical): No    Lack of Transportation (Non-Medical): No  Physical Activity: Inactive (01/31/2021)   Exercise Vital Sign    Days of Exercise per Week: 0 days    Minutes of Exercise per Session: 0 min  Stress: No Stress Concern Present (01/31/2021)   [place]    Feeling of Stress : Not at all  Social Connections: Not on file  Intimate Partner Violence: Not on file   Social History   Tobacco Use  Smoking Status Former   Packs/day: 1.00   Types: Cigarettes   Quit date: 2002   Years since quitting: 21.8  Smokeless Tobacco Never  Tobacco Comments   started smokng at age 54   Social History   Substance and Sexual Activity  Alcohol Use Not Currently   Alcohol/week: 0.0 standard drinks of alcohol   Comment: rare    Family History:  Family History  Problem Relation Age of Onset   Alzheimer's disease Mother    Heart attack Mother    Heart attack Father    Cancer Sister    Diabetes Sister    Diabetes Sister     Past medical history, surgical history, medications, allergies, family history and social history reviewed with patient today and changes made to appropriate areas of the chart.   Review of Systems  Eyes:  Negative for blurred vision and double vision.  Respiratory:  Negative for shortness of breath.   Cardiovascular:  Negative for chest pain, palpitations and leg swelling.  Neurological:  Negative for dizziness and headaches.   All other ROS negative except what is listed above and in the HPI.      Objective:    BP (!) 111/59   Pulse (!) 56   Temp  97.9 F (36.6 C) (Oral)   Wt 167 lb 6.4 oz (75.9 kg)   SpO2 96%  BMI 22.70 kg/m   Wt Readings from Last 3 Encounters:  10/05/22 167 lb 6.4 oz (75.9 kg)  09/01/22 170 lb 8 oz (77.3 kg)  08/19/22 169 lb 1.6 oz (76.7 kg)    No results found.  Physical Exam Vitals and nursing note reviewed.  Constitutional:      General: He is not in acute distress.    Appearance: Normal appearance. He is not ill-appearing, toxic-appearing or diaphoretic.  HENT:     Head: Normocephalic.     Right Ear: Tympanic membrane, ear canal and external ear normal.     Left Ear: Tympanic membrane, ear canal and external ear normal.     Nose: Nose normal. No congestion or rhinorrhea.     Mouth/Throat:     Mouth: Mucous membranes are moist.  Eyes:     General:        Right eye: No discharge.        Left eye: No discharge.     Extraocular Movements: Extraocular movements intact.     Conjunctiva/sclera: Conjunctivae normal.     Pupils: Pupils are equal, round, and reactive to light.  Cardiovascular:     Rate and Rhythm: Normal rate and regular rhythm.     Heart sounds: No murmur heard. Pulmonary:     Effort: Pulmonary effort is normal. No respiratory distress.     Breath sounds: Normal breath sounds. No wheezing, rhonchi or rales.  Abdominal:     General: Abdomen is flat. Bowel sounds are normal. There is no distension.     Palpations: Abdomen is soft.     Tenderness: There is no abdominal tenderness. There is no guarding.  Musculoskeletal:     Cervical back: Normal range of motion and neck supple.  Skin:    General: Skin is warm and dry.     Capillary Refill: Capillary refill takes [name] than 2 seconds.  Neurological:     General: No focal deficit present.     Mental Status: He is alert and oriented to person, place, and time.     Cranial Nerves: No cranial nerve deficit.     Motor: No weakness.     Deep Tendon Reflexes: Reflexes normal.  Psychiatric:        Mood and Affect: Mood normal.         Behavior: Behavior normal.        Thought Content: Thought content normal.        Judgment: Judgment normal.        01/31/2021    1:50 PM  6CIT Screen  What Year? 0 points  What month? 0 points  What time? 0 points  Count back from 20 0 points  Months in reverse 2 points  Repeat phrase 4 points  Total Score 6 points    Cognitive Testing - 6-CIT  Correct? Score   What year is it? yes 0 Yes = 0    No = 4  What month is it? yes 0 Yes = 0    No = 3  Remember:     Pia Mau, [place], [place]     What time is it? yes 0 Yes = 0    No = 3  Count backwards from 20 to 1 yes 0 Correct = 0    1 error = 2   More than 1 error = 4  Say the months of the year in reverse. yes 0 Correct = 0    1 error =  2   More than 1 error = 4  What address did I ask you to remember? yes 2 Correct = 0  1 error = 2    2 error = 4    3 error = 6    4 error = 8    All wrong = 10       TOTAL SCORE  2/28   Interpretation:  Normal  Normal (0-7) Abnormal (8-28)    Results for orders placed or performed in visit on 09/01/22  CoaguChek XS/INR Waived (STAT)  Result Value Ref Range   INR 2.6 (H) 0.9 - 1.1   Prothrombin Time 31.5 sec      Assessment & Plan:   Problem List Items Addressed This Visit       Other   Encounter for current long-term use of anticoagulants   Relevant Orders   CoaguChek XS/INR Waived (STAT)   Other Visit Diagnoses     Encounter for annual wellness exam in Medicare patient    -  Primary   Advanced care planning/counseling discussion            Preventative Services:  Health Risk Assessment and Personalized Prevention Plan: Up to date Bone Mass Measurements: Up to date CVD Screening: Up to date Colon Cancer Screening: NA Depression Screening: Up to date Diabetes Screening: Up to date Glaucoma Screening: declines Hepatitis B vaccine: NA Hepatitis C screening: Needs to get lab drawn HIV Screening: up to date Flu Vaccine: Declines Lung cancer Screening:  NA Obesity Screening: Up to date Pneumonia Vaccines (2): Up to date STI Screening: NA PSA screening: Up todate  Discussed aspirin prophylaxis for myocardial infarction prevention and decision was it was not indicated  LABORATORY TESTING:  Health maintenance labs ordered today as discussed above.   PSA was done already this year.   IMMUNIZATIONS:   - Tdap: Tetanus vaccination status reviewed: last tetanus booster within 10 years. - Influenza: Refused - Pneumovax: Up to date - Prevnar: Up to date - Zostavax vaccine: Refused  SCREENING: - Colonoscopy: Not applicable  Discussed with patient purpose of the colonoscopy is to detect colon cancer at curable precancerous or early stages   - AAA Screening: Not applicable  -Hearing Test: Not applicable  -Spirometry: Not applicable   PATIENT COUNSELING:    Sexuality: Discussed sexually transmitted diseases, partner selection, use of condoms, avoidance of unintended pregnancy  and contraceptive alternatives.   Advised to avoid cigarette smoking.  I discussed with the patient that most people either abstain from alcohol or drink within safe limits (<=14/week and <=4 drinks/occasion for males, <=7/weeks and <= 3 drinks/occasion for females) and that the risk for alcohol disorders and other health effects rises proportionally with the number of drinks per week and how often a drinker exceeds daily limits.  Discussed cessation/primary prevention of drug use and availability of treatment for abuse.   Diet: Encouraged to adjust caloric intake to maintain  or achieve ideal body weight, to reduce intake of dietary saturated fat and total fat, to limit sodium intake by avoiding high sodium foods and not adding table salt, and to maintain adequate dietary potassium and calcium preferably from fresh fruits, vegetables, and low-fat dairy products.    stressed the importance of regular exercise  Injury prevention: Discussed safety belts, safety  helmets, smoke detector, smoking near bedding or upholstery.   Dental health: Discussed importance of regular tooth brushing, flossing, and dental visits.   Follow up plan: NEXT PREVENTATIVE PHYSICAL DUE IN 1  YEAR. Return in about 1 month (around 11/04/2022) for Coag check.

## 2022-10-25 ENCOUNTER — Other Ambulatory Visit: Payer: Self-pay | Admitting: Nurse Practitioner

## 2022-10-27 NOTE — Telephone Encounter (Signed)
Requested Prescriptions  Pending Prescriptions Disp Refills   amLODipine (NORVASC) 2.5 MG tablet [Pharmacy Med Name: AMLODIPINE BESYLATE 2.5 MG TAB] 90 tablet 0    Sig: TAKE 1 TABLET BY MOUTH EVERY DAY     Cardiovascular: Calcium Channel Blockers 2 Passed - 10/25/2022  1:21 AM      Passed - Last BP in normal range    BP Readings from Last 1 Encounters:  10/05/22 (!) 111/59         Passed - Last Heart Rate in normal range    Pulse Readings from Last 1 Encounters:  10/05/22 (!) 56         Passed - Valid encounter within last 6 months    Recent Outpatient Visits           3 weeks ago Encounter for annual wellness exam in Medicare patient   [place], NP   1 month ago Antithrombin 3 deficiency ([place])   [facility] Jon Billings, NP   2 months ago Antithrombin 3 deficiency ([place])   [facility] Jon Billings, NP   3 months ago Antithrombin 3 deficiency ([place])   [facility] Jon Billings, NP   5 months ago Antithrombin 3 deficiency [facility])   [place] Jon Billings, NP       Future Appointments             In 1 week Jon Billings, NP [facility], [place]

## 2022-11-09 ENCOUNTER — Ambulatory Visit (INDEPENDENT_AMBULATORY_CARE_PROVIDER_SITE_OTHER): Payer: Medicare Other | Admitting: Nurse Practitioner

## 2022-11-09 ENCOUNTER — Encounter: Payer: Self-pay | Admitting: Nurse Practitioner

## 2022-11-09 VITALS — BP 117/60 | HR 73 | Temp 98.3°F | Wt 167.9 lb

## 2022-11-09 DIAGNOSIS — D6859 Other primary thrombophilia: Secondary | ICD-10-CM | POA: Diagnosis not present

## 2022-11-09 NOTE — Assessment & Plan Note (Signed)
Chronic,  2.3 today which is within goal.  Goal INR 2.0-3.0.  Continue -5 mg of Coumadin on Monday, Wednesday, Friday, and Sunday & 2.5 mg of Coumadin on Tuesday and Thursday and Saturday.  Follow-up INR in 2 weeks.  Decrease green leafy vegetables.  Follow up in 2 weeks.  Will adjust dose if needed at that time.  Bleeding precautions discussed with patient at visit today.

## 2022-11-09 NOTE — Progress Notes (Signed)
BP 117/60   Pulse 73   Temp 98.3 F (36.8 C) (Oral)   Wt 167 lb 14.4 oz (76.2 kg)   SpO2 97%   BMI 22.77 kg/m    Subjective:    Patient ID: Thomas [name], male    DOB: Feb 28, 1946, 76 y.o.   MRN: [id number]  HPI: Thomas [name] is a 76 y.o. male  No chief complaint on file.  This patient is a 76 y.o. male who presents for coumadin management. His last INR was 2.5.  He is here for a 1 month visit to ensure he is back within range.  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  Patient denies any recent bleeding.  Denies night sweats, fever, fatigue, or weight loss.  COUMADIN CHECK INR Today: 2.3 Present Coumadin dose: 2.5 mg of Coumadin on Monday, Wednesday, Friday & Sunday 5 mg of Coumadin on Tuesday, Thursday, Saturday Goal: 2.0-3.0 Excessive bruising: no Nose bleeding: no Rectal bleeding: no Prolonged menstrual cycles: N/A Eating diet with consistent amounts of foods containing Vitamin K:yes Any recent antibiotic use? no     Relevant past medical, surgical, family and social history reviewed and updated as indicated. Interim medical history since our last visit reviewed. Allergies and medications reviewed and updated.  Review of Systems  Constitutional:  Negative for chills, diaphoresis, fatigue, fever and unexpected weight change.  Eyes:  Negative for visual disturbance.  Respiratory:  Negative for shortness of breath.   Cardiovascular:  Negative for chest pain and leg swelling.  Neurological:  Negative for light-headedness and headaches.    Per HPI unless specifically indicated above     Objective:    BP 117/60   Pulse 73   Temp 98.3 F (36.8 C) (Oral)   Wt 167 lb 14.4 oz (76.2 kg)   SpO2 97%   BMI 22.77 kg/m   Wt Readings from Last 3 Encounters:  11/09/22 167 lb 14.4 oz (76.2 kg)  10/05/22 167 lb 6.4 oz (75.9 kg)  09/01/22 170  lb 8 oz (77.3 kg)    Physical Exam Vitals and nursing note reviewed.  Constitutional:      General: He is not in acute distress.    Appearance: Normal appearance. He is not ill-appearing, toxic-appearing or diaphoretic.  HENT:     Head: Normocephalic.      Right Ear: External ear normal.     Left Ear: External ear normal.     Nose: No rhinorrhea.     Mouth/Throat:     Mouth: Mucous membranes are moist.  Eyes:     General:        Right eye: No discharge.        Left eye: No discharge.     Extraocular Movements: Extraocular movements intact.     Conjunctiva/sclera: Conjunctivae normal.     Pupils: Pupils are equal, round, and reactive to light.  Cardiovascular:     Rate and Rhythm: Normal rate and regular rhythm.     Heart sounds: No murmur heard. Pulmonary:     Effort: Pulmonary effort is normal. No respiratory distress.     Breath sounds: Normal breath sounds. No wheezing, rhonchi or rales.  Abdominal:     General: Abdomen is flat. Bowel sounds are normal.  Musculoskeletal:     Cervical back: Normal range of motion and neck supple.  Skin:    General: Skin is warm  and dry.     Capillary Refill: Capillary refill takes [name] than 2 seconds.  Neurological:     General: No focal deficit present.     Mental Status: He is alert and oriented to person, place, and time.  Psychiatric:        Mood and Affect: Mood normal.        Behavior: Behavior normal.        Thought Content: Thought content normal.        Judgment: Judgment normal.     Results for orders placed or performed in visit on 10/05/22  CoaguChek XS/INR Waived (STAT)  Result Value Ref Range   INR 2.5 (H) 0.9 - 1.1   Prothrombin Time 30.5 sec      Assessment & Plan:   Problem List Items Addressed This Visit       Hematopoietic and Hemostatic   Antithrombin 3 deficiency (HCC) - Primary    Chronic,  2.3 today which is within goal.  Goal INR 2.0-3.0.  Continue -5 mg of Coumadin on Monday, Wednesday, Friday,  and Sunday & 2.5 mg of Coumadin on Tuesday and Thursday and Saturday.  Follow-up INR in 2 weeks.  Decrease green leafy vegetables.  Follow up in 2 weeks.  Will adjust dose if needed at that time.  Bleeding precautions discussed with patient at visit today.       Relevant Orders   CoaguChek XS/INR Waived (STAT)     Follow up plan: Return in about 1 month (around 12/10/2022) for Coag check.

## 2022-11-11 LAB — COAGUCHEK XS/INR WAIVED
INR: 2.3 — ABNORMAL HIGH (ref 0.9–1.1)
Prothrombin Time: 27.7 s

## 2022-11-11 NOTE — Progress Notes (Signed)
Results discussed with patient during visit.

## 2022-11-19 ENCOUNTER — Other Ambulatory Visit: Payer: Self-pay | Admitting: Nurse Practitioner

## 2022-11-19 NOTE — Telephone Encounter (Signed)
Requested Prescriptions  Pending Prescriptions Disp Refills   atorvastatin (LIPITOR) 10 MG tablet [Pharmacy Med Name: ATORVASTATIN 10 MG TABLET] 90 tablet 0    Sig: TAKE 1 TABLET BY MOUTH EVERY DAY     Cardiovascular:  Antilipid - Statins Failed - 11/19/2022  2:05 AM      Failed - Lipid Panel in normal range within the last 12 months    Cholesterol, Total  Date Value Ref Range Status  08/19/2022 101 100 - 199 mg/dL Final   LDL Chol Calc (NIH)  Date Value Ref Range Status  08/19/2022 44 0 - 99 mg/dL Final   HDL  Date Value Ref Range Status  08/19/2022 45 >39 mg/dL Final   Triglycerides  Date Value Ref Range Status  08/19/2022 49 0 - 149 mg/dL Final         Passed - Patient is not pregnant      Passed - Valid encounter within last 12 months    Recent Outpatient Visits           1 week ago 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

## 2022-12-10 ENCOUNTER — Encounter: Payer: Self-pay | Admitting: Physician Assistant

## 2022-12-10 ENCOUNTER — Ambulatory Visit (INDEPENDENT_AMBULATORY_CARE_PROVIDER_SITE_OTHER): Payer: 59 | Admitting: Physician Assistant

## 2022-12-10 VITALS — BP 116/63 | HR 68 | Temp 98.0°F | Wt 167.1 lb

## 2022-12-10 DIAGNOSIS — D6859 Other primary thrombophilia: Secondary | ICD-10-CM

## 2022-12-10 LAB — COAGUCHEK XS/INR WAIVED
INR: 3.1 — ABNORMAL HIGH (ref 0.9–1.1)
Prothrombin Time: 37.3 s

## 2022-12-10 NOTE — Progress Notes (Signed)
Acute Office Visit   Patient: Thomas [name]   DOB: 03/09/1946   77 y.o. Male  MRN: [id number] Visit Date: 12/10/2022  Today's healthcare provider: Dani Gobble Keenen Roessner, PA-C  Introduced myself to the patient as a Journalist, newspaper and provided education on APPs in clinical practice.    Chief Complaint  Patient presents with   Antithrombin 3 Deficiency   Subjective    HPI    Patient is here for anticoagulation check as he is on Coumadin  PT INR: 3.1  He reports he is taking 5 mg every day right now   Medications: Outpatient Medications Prior to Visit  Medication Sig   amLODipine (NORVASC) 2.5 MG tablet TAKE 1 TABLET BY MOUTH EVERY DAY   atorvastatin (LIPITOR) 10 MG tablet TAKE 1 TABLET BY MOUTH EVERY DAY   diphenhydrAMINE-PE-APAP (EQ SEVERE ALLERGY & SINUS) 25-5-325 MG TABS Take 1 tablet by mouth as needed.   Pseudoeph-Doxylamine-DM-APAP (DAYQUIL/NYQUIL COLD/FLU RELIEF PO) Take by mouth as needed.   warfarin (COUMADIN) 5 MG tablet Take 1 tablet (5 mg total) by mouth daily.   No facility-administered medications prior to visit.    Review of Systems  Gastrointestinal:  Negative for anal bleeding and blood in stool.  Hematological:  Does not bruise/bleed easily.       Objective    BP 116/63   Pulse 68   Temp 98 F (36.7 C) (Oral)   Wt 167 lb 1.6 oz (75.8 kg)   SpO2 97%   BMI 22.66 kg/m    Physical Exam Vitals reviewed.  Constitutional:      Appearance: Normal appearance.  HENT:     Head: Normocephalic and atraumatic.  Eyes:     Extraocular Movements: Extraocular movements intact.     Conjunctiva/sclera: Conjunctivae normal.  Pulmonary:     Effort: Pulmonary effort is normal.  Musculoskeletal:        General: Normal range of motion.     Cervical back: Normal range of motion.  Neurological:     General: No focal deficit present.     Mental Status: He is alert and oriented to person, place, and time. Mental status is at baseline.  Psychiatric:        Mood and  Affect: Mood normal.        Behavior: Behavior normal.        Thought Content: Thought content normal.        Judgment: Judgment normal.       No results found for any visits on 12/10/22.  Assessment & Plan      Return in about 2 weeks (around 12/24/2022).      Problem List Items Addressed This Visit       Hematopoietic and Hemostatic   Antithrombin 3 deficiency (HCC) - Primary    Chronic, ongoing INR is 3.1 today with goal being 2.0-3.0  He reports he is taking 5 mg every day? Recommend he return to the Coumadin 5 mg on MWFSun and 2.5 mg of Coumadin on TThSat  Recommend follow up in 2 weeks for monitoring  He is aware of dietary restrictions and denies bleeding concerns or bruising today Follow up in 2 weeks for recheck        Relevant Orders   CoaguChek XS/INR Waived (STAT)     Return in about 2 weeks (around 12/24/2022).   I, Clothilde Tippetts E Soyla Bainter, PA-C, have reviewed all documentation for this visit. The documentation on 12/10/22 for the  exam, diagnosis, procedures, and orders are all accurate and complete.   Talitha Givens, MHS, PA-C [facility]

## 2022-12-10 NOTE — Assessment & Plan Note (Signed)
Chronic, ongoing INR is 3.1 today with goal being 2.0-3.0  He reports he is taking 5 mg every day? Recommend he return to the Coumadin 5 mg on MWFSun and 2.5 mg of Coumadin on TThSat  Recommend follow up in 2 weeks for monitoring  He is aware of dietary restrictions and denies bleeding concerns or bruising today Follow up in 2 weeks for recheck

## 2022-12-10 NOTE — Patient Instructions (Addendum)
Please do the following for your Coumadin   Take 5 mg on Monday, Wednesday, Friday and Sunday  Take 2.5 mg on Tuesday, Thursday, Saturday   Please return in 2 weeks for recheck of your INR

## 2022-12-29 ENCOUNTER — Encounter: Payer: Self-pay | Admitting: Nurse Practitioner

## 2022-12-29 ENCOUNTER — Ambulatory Visit (INDEPENDENT_AMBULATORY_CARE_PROVIDER_SITE_OTHER): Payer: 59 | Admitting: Nurse Practitioner

## 2022-12-29 VITALS — BP 131/63 | HR 83 | Temp 98.0°F

## 2022-12-29 DIAGNOSIS — D6859 Other primary thrombophilia: Secondary | ICD-10-CM | POA: Diagnosis not present

## 2022-12-29 DIAGNOSIS — Z7901 Long term (current) use of anticoagulants: Secondary | ICD-10-CM | POA: Diagnosis not present

## 2022-12-29 LAB — COAGUCHEK XS/INR WAIVED
INR: 2.7 — ABNORMAL HIGH (ref 0.9–1.1)
Prothrombin Time: 32.7 s

## 2022-12-29 NOTE — Progress Notes (Signed)
Results discussed with patient during visit.

## 2022-12-29 NOTE — Assessment & Plan Note (Signed)
Chronic,  2.7 today which is within goal.  Goal INR 2.0-3.0.  Continue -5 mg of Coumadin on Monday, Wednesday, Friday, and Sunday & 2.5 mg of Coumadin on Tuesday and Thursday and Saturday.  Follow-up INR in 1 month.  Decrease green leafy vegetables.  Will adjust dose if needed at that time.  Bleeding precautions discussed with patient at visit today.

## 2022-12-29 NOTE — Progress Notes (Signed)
BP 131/63   Pulse 83   Temp 98 F (36.7 C) (Oral)   SpO2 98%    Subjective:    Patient ID: Thomas [name], male    DOB: 27-Nov-1946, 77 y.o.   MRN: [id number]  HPI: Thomas [name] is a 77 y.o. male  Chief Complaint  Patient presents with   Coagulation Disorder   This patient is a 77 y.o. male who presents for coumadin management. His last INR was 3.1.  He is here for a 2 week visit to ensure he is back within range.  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  Patient denies any recent bleeding.  Denies night sweats, fever, fatigue, or weight loss.  COUMADIN CHECK INR Today: 2.3 Present Coumadin dose: 2.5 mg of Coumadin on Monday, Wednesday, Friday & Sunday 5 mg of Coumadin on Tuesday, Thursday, Saturday Goal: 2.0-3.0 Excessive bruising: no Nose bleeding: no Rectal bleeding: no Prolonged menstrual cycles: N/A Eating diet with consistent amounts of foods containing Vitamin K:yes Any recent antibiotic use? no     Relevant past medical, surgical, family and social history reviewed and updated as indicated. Interim medical history since our last visit reviewed. Allergies and medications reviewed and updated.  Review of Systems  Constitutional:  Negative for chills, diaphoresis, fatigue, fever and unexpected weight change.  Eyes:  Negative for visual disturbance.  Respiratory:  Negative for shortness of breath.   Cardiovascular:  Negative for chest pain and leg swelling.  Neurological:  Negative for light-headedness and headaches.    Per HPI unless specifically indicated above     Objective:    BP 131/63   Pulse 83   Temp 98 F (36.7 C) (Oral)   SpO2 98%   Wt Readings from Last 3 Encounters:  12/10/22 167 lb 1.6 oz (75.8 kg)  11/09/22 167 lb 14.4 oz (76.2 kg)  10/05/22 167 lb 6.4 oz (75.9 kg)    Physical Exam Vitals and nursing note  reviewed.  Constitutional:      General: He is not in acute distress.    Appearance: Normal appearance. He is not ill-appearing, toxic-appearing or diaphoretic.  HENT:     Head: Normocephalic.      Right Ear: External ear normal.     Left Ear: External ear normal.     Nose: No rhinorrhea.     Mouth/Throat:     Mouth: Mucous membranes are moist.  Eyes:     General:        Right eye: No discharge.        Left eye: No discharge.     Extraocular Movements: Extraocular movements intact.     Conjunctiva/sclera: Conjunctivae normal.     Pupils: Pupils are equal, round, and reactive to light.  Cardiovascular:     Rate and Rhythm: Normal rate and regular rhythm.     Heart sounds: No murmur heard. Pulmonary:     Effort: Pulmonary effort is normal. No respiratory distress.     Breath sounds: Normal breath sounds. No wheezing, rhonchi or rales.  Abdominal:     General: Abdomen is flat. Bowel sounds are normal.  Musculoskeletal:     Cervical back: Normal range of motion and neck supple.  Skin:    General: Skin is warm and dry.     Capillary Refill: Capillary refill takes [name] than 2 seconds.  Neurological:     General:  No focal deficit present.     Mental Status: He is alert and oriented to person, place, and time.  Psychiatric:        Mood and Affect: Mood normal.        Behavior: Behavior normal.        Thought Content: Thought content normal.        Judgment: Judgment normal.     Results for orders placed or performed in visit on 12/10/22  CoaguChek XS/INR Waived (STAT)  Result Value Ref Range   INR 3.1 (H) 0.9 - 1.1   Prothrombin Time 37.3 sec      Assessment & Plan:   Problem List Items Addressed This Visit       Hematopoietic and Hemostatic   Antithrombin 3 deficiency (HCC) - Primary    Chronic,  2.7 today which is within goal.  Goal INR 2.0-3.0.  Continue -5 mg of Coumadin on Monday, Wednesday, Friday, and Sunday & 2.5 mg of Coumadin on Tuesday and Thursday and  Saturday.  Follow-up INR in 1 month.  Decrease green leafy vegetables.  Will adjust dose if needed at that time.  Bleeding precautions discussed with patient at visit today.       Relevant Orders   CoaguChek XS/INR Waived (STAT)     Other   Encounter for current long-term use of anticoagulants    Chronic,  2.7 today which is within goal.  Goal INR 2.0-3.0.  Continue -5 mg of Coumadin on Monday, Wednesday, Friday, and Sunday & 2.5 mg of Coumadin on Tuesday and Thursday and Saturday.  Follow-up INR in 1 month.  Decrease green leafy vegetables.  Will adjust dose if needed at that time.  Bleeding precautions discussed with patient at visit today.       Relevant Orders   CoaguChek XS/INR Waived (STAT)     Follow up plan: Return in about 1 month (around 01/28/2023) for Coag check.

## 2023-01-22 ENCOUNTER — Other Ambulatory Visit: Payer: Self-pay | Admitting: Nurse Practitioner

## 2023-01-22 NOTE — Telephone Encounter (Signed)
Requested Prescriptions  Pending Prescriptions Disp Refills   amLODipine (NORVASC) 2.5 MG tablet [Pharmacy Med Name: AMLODIPINE BESYLATE 2.5 MG TAB] 90 tablet 0    Sig: TAKE 1 TABLET BY MOUTH EVERY DAY     Cardiovascular: Calcium Channel Blockers 2 Passed - 01/22/2023  1:47 AM      Passed - Last BP in normal range    BP Readings from Last 1 Encounters:  12/29/22 131/63         Passed - Last Heart Rate in normal range    Pulse Readings from Last 1 Encounters:  12/29/22 83         Passed - Valid encounter within last 6 months    Recent Outpatient Visits           3 weeks ago Antithrombin 3 deficiency ([place])   [place], Karen, NP   1 month ago Antithrombin 3 deficiency ([place])   [place]iles, PA-C   2 months ago Antithrombin 3 deficiency [facility])   [place] Jon Billings, NP   3 months ago Encounter for annual wellness exam in Medicare patient   [place], NP   4 months ago Antithrombin 3 deficiency [facility])   [place] Jon Billings, NP       Future Appointments             In 6 days Jon Billings, NP [place], [facility]

## 2023-01-28 ENCOUNTER — Ambulatory Visit (INDEPENDENT_AMBULATORY_CARE_PROVIDER_SITE_OTHER): Payer: 59 | Admitting: Nurse Practitioner

## 2023-01-28 ENCOUNTER — Encounter: Payer: Self-pay | Admitting: Nurse Practitioner

## 2023-01-28 VITALS — BP 129/64 | HR 63 | Temp 97.7°F | Wt 166.6 lb

## 2023-01-28 DIAGNOSIS — E785 Hyperlipidemia, unspecified: Secondary | ICD-10-CM

## 2023-01-28 DIAGNOSIS — D6859 Other primary thrombophilia: Secondary | ICD-10-CM | POA: Diagnosis not present

## 2023-01-28 DIAGNOSIS — R7301 Impaired fasting glucose: Secondary | ICD-10-CM | POA: Diagnosis not present

## 2023-01-28 DIAGNOSIS — I10 Essential (primary) hypertension: Secondary | ICD-10-CM | POA: Diagnosis not present

## 2023-01-28 LAB — COAGUCHEK XS/INR WAIVED
INR: 3.7 — ABNORMAL HIGH (ref 0.9–1.1)
Prothrombin Time: 43.9 s

## 2023-01-28 NOTE — Assessment & Plan Note (Signed)
Labs ordered at visit today.  Will make recommendations based on lab results.   

## 2023-01-28 NOTE — Progress Notes (Signed)
BP 129/64   Pulse 63   Temp 97.7 F (36.5 C) (Oral)   Wt 166 lb 9.6 oz (75.6 kg)   SpO2 97%   BMI 22.60 kg/m    Subjective:    Patient ID: Thomas [name], male    DOB: 04-03-1946, 77 y.o.   MRN: [id number]  HPI: Thomas [name] is a 77 y.o. male  Chief Complaint  Patient presents with   Hyperlipidemia   Hypertension   This patient is a 77 y.o. male who presents for coumadin management. His last INR was 3.7.  He is here for a 1 month visit to ensure he is back within range.  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  Patient denies any recent bleeding.  Denies night sweats, fever, fatigue, or weight loss.  COUMADIN CHECK INR Today: 3.7- hasn't changed his diet.   Present Coumadin dose: 2.5 mg of Coumadin on Monday, Wednesday, Friday & Sunday 5 mg of Coumadin on Tuesday, Thursday, Saturday Goal: 2.0-3.0 Excessive bruising: no Nose bleeding: no Rectal bleeding: no Prolonged menstrual cycles: N/A Eating diet with consistent amounts of foods containing Vitamin K:yes Any recent antibiotic use? no Relevant past medical, surgical, family and social history reviewed and updated as indicated. Interim medical history since our last visit reviewed. Allergies and medications reviewed and updated.  HYPERTENSION Hypertension status: controlled  Satisfied with current treatment? no Duration of hypertension: years BP monitoring frequency:  not checking BP range:  BP medication side effects:  no Medication compliance: excellent compliance Previous BP meds:amlodipine Aspirin:  Coumadin Recurrent headaches: no Visual changes: no Palpitations: no Dyspnea: no Chest pain: no Lower extremity edema: no Dizzy/lightheaded: no   Review of Systems  Constitutional:  Negative for chills, diaphoresis, fatigue, fever and unexpected weight change.  Eyes:  Negative for  visual disturbance.  Respiratory:  Negative for shortness of breath.   Cardiovascular:  Negative for chest pain and leg swelling.  Neurological:  Negative for light-headedness and headaches.    Per HPI unless specifically indicated above     Objective:    BP 129/64   Pulse 63   Temp 97.7 F (36.5 C) (Oral)   Wt 166 lb 9.6 oz (75.6 kg)   SpO2 97%   BMI 22.60 kg/m   Wt Readings from Last 3 Encounters:  01/28/23 166 lb 9.6 oz (75.6 kg)  12/10/22 167 lb 1.6 oz (75.8 kg)  11/09/22 167 lb 14.4 oz (76.2 kg)    Physical Exam Vitals and nursing note reviewed.  Constitutional:      General: He is not in acute distress.    Appearance: Normal appearance. He is not ill-appearing, toxic-appearing or diaphoretic.  HENT:     Head: Normocephalic.      Right Ear: External ear normal.     Left Ear: External ear normal.     Nose: No rhinorrhea.     Mouth/Throat:     Mouth: Mucous membranes are moist.  Eyes:     General:        Right eye: No discharge.        Left eye: No discharge.     Extraocular Movements: Extraocular movements intact.     Conjunctiva/sclera: Conjunctivae normal.     Pupils: Pupils are equal, round, and reactive to light.  Cardiovascular:     Rate and Rhythm: Normal rate and regular rhythm.  Heart sounds: No murmur heard. Pulmonary:     Effort: Pulmonary effort is normal. No respiratory distress.     Breath sounds: Normal breath sounds. No wheezing, rhonchi or rales.  Abdominal:     General: Abdomen is flat. Bowel sounds are normal.  Musculoskeletal:     Cervical back: Normal range of motion and neck supple.  Skin:    General: Skin is warm and dry.     Capillary Refill: Capillary refill takes [name] than 2 seconds.  Neurological:     General: No focal deficit present.     Mental Status: He is alert and oriented to person, place, and time.  Psychiatric:        Mood and Affect: Mood normal.        Behavior: Behavior normal.        Thought Content: Thought  content normal.        Judgment: Judgment normal.     Results for orders placed or performed in visit on 12/29/22  CoaguChek XS/INR Waived (STAT)  Result Value Ref Range   INR 2.7 (H) 0.9 - 1.1   Prothrombin Time 32.7 sec      Assessment & Plan:   Problem List Items Addressed This Visit       Cardiovascular and Mediastinum   Essential hypertension - Primary    Chronic.  Controlled.  Continue with current medication regimen.  Labs ordered today.  Return to clinic in 3 months for reevaluation.  Call sooner if concerns arise.        Relevant Orders   Comp Met (CMET)     Endocrine   Impaired fasting glucose    Labs ordered at visit today.  Will make recommendations based on lab results.        Relevant Orders   HgB A1c     Hematopoietic and Hemostatic   Antithrombin 3 deficiency (HCC)    Chronic,  3.7 today which is within goal.  Skip dose for tomorrow morning.  Resume regular scheduled dosing after tomorrow.  Goal INR 2.0-3.0.  Continue -5 mg of Coumadin on Monday, Wednesday, Friday, and Sunday & 2.5 mg of Coumadin on Tuesday and Thursday and Saturday.  Follow-up INR in 1 week.  Decrease green leafy vegetables.  Will adjust dose if needed at that time.  Bleeding precautions discussed with patient at visit today.       Relevant Orders   CBC w/Diff   CoaguChek XS/INR Waived (STAT)     Other   Hyperlipidemia    Labs ordered at visit today.  Will make recommendations based on lab results.        Relevant Orders   Lipid Profile     Follow up plan: Return in about 1 week (around 02/04/2023) for Coag check.

## 2023-01-28 NOTE — Assessment & Plan Note (Signed)
Chronic,  3.7 today which is within goal.  Skip dose for tomorrow morning.  Resume regular scheduled dosing after tomorrow.  Goal INR 2.0-3.0.  Continue -5 mg of Coumadin on Monday, Wednesday, Friday, and Sunday & 2.5 mg of Coumadin on Tuesday and Thursday and Saturday.  Follow-up INR in 1 week.  Decrease green leafy vegetables.  Will adjust dose if needed at that time.  Bleeding precautions discussed with patient at visit today.

## 2023-01-28 NOTE — Assessment & Plan Note (Signed)
Chronic.  Controlled.  Continue with current medication regimen.   Labs ordered today.  Return to clinic in 3 months for reevaluation.  Call sooner if concerns arise.   

## 2023-01-29 LAB — CBC WITH DIFFERENTIAL/PLATELET
Basophils Absolute: 0.1 10*3/uL (ref 0.0–0.2)
Basos: 1 %
EOS (ABSOLUTE): 0.3 10*3/uL (ref 0.0–0.4)
Eos: 4 %
Hematocrit: 28.5 % — ABNORMAL LOW (ref 37.5–51.0)
Hemoglobin: 7.9 g/dL — ABNORMAL LOW (ref 13.0–17.7)
Immature Grans (Abs): 0 10*3/uL (ref 0.0–0.1)
Immature Granulocytes: 0 %
Lymphocytes Absolute: 2.2 10*3/uL (ref 0.7–3.1)
Lymphs: 30 %
MCH: 18.3 pg — ABNORMAL LOW (ref 26.6–33.0)
MCHC: 27.7 g/dL — ABNORMAL LOW (ref 31.5–35.7)
MCV: 66 fL — ABNORMAL LOW (ref 79–97)
Monocytes Absolute: 0.5 10*3/uL (ref 0.1–0.9)
Monocytes: 7 %
Neutrophils Absolute: 4.3 10*3/uL (ref 1.4–7.0)
Neutrophils: 58 %
Platelets: 224 10*3/uL (ref 150–450)
RBC: 4.32 x10E6/uL (ref 4.14–5.80)
RDW: 16.2 % — ABNORMAL HIGH (ref 11.6–15.4)
WBC: 7.4 10*3/uL (ref 3.4–10.8)

## 2023-01-29 LAB — COMPREHENSIVE METABOLIC PANEL
ALT: 6 IU/L (ref 0–44)
AST: 13 IU/L (ref 0–40)
Albumin/Globulin Ratio: 1.6 (ref 1.2–2.2)
Albumin: 4.4 g/dL (ref 3.8–4.8)
Alkaline Phosphatase: 65 IU/L (ref 44–121)
BUN/Creatinine Ratio: 12 (ref 10–24)
BUN: 13 mg/dL (ref 8–27)
Bilirubin Total: 0.4 mg/dL (ref 0.0–1.2)
CO2: 23 mmol/L (ref 20–29)
Calcium: 8.9 mg/dL (ref 8.6–10.2)
Chloride: 102 mmol/L (ref 96–106)
Creatinine, Ser: 1.08 mg/dL (ref 0.76–1.27)
Globulin, Total: 2.8 g/dL (ref 1.5–4.5)
Glucose: 92 mg/dL (ref 70–99)
Potassium: 4.5 mmol/L (ref 3.5–5.2)
Sodium: 138 mmol/L (ref 134–144)
Total Protein: 7.2 g/dL (ref 6.0–8.5)
eGFR: 71 mL/min/{1.73_m2} (ref >59)

## 2023-01-29 LAB — LIPID PANEL
Chol/HDL Ratio: 2.4 ratio (ref 0.0–5.0)
Cholesterol, Total: 97 mg/dL — ABNORMAL LOW (ref 100–199)
HDL: 40 mg/dL (ref >39)
LDL Chol Calc (NIH): 45 mg/dL (ref 0–99)
Triglycerides: 48 mg/dL (ref 0–149)
VLDL Cholesterol Cal: 12 mg/dL (ref 5–40)

## 2023-01-29 LAB — HEMOGLOBIN A1C
Est. average glucose Bld gHb Est-mCnc: 120 mg/dL
Hgb A1c MFr Bld: 5.8 % — ABNORMAL HIGH (ref 4.8–5.6)

## 2023-01-29 NOTE — Progress Notes (Signed)
Please let patient know that his lab work shows that his anemia has worsened.  I highly recommend he see the Hematologist.  I can place a referral if he agrees.  Otherwise, his lab work looks good.  No other concerns a this time.

## 2023-02-10 ENCOUNTER — Ambulatory Visit (INDEPENDENT_AMBULATORY_CARE_PROVIDER_SITE_OTHER): Payer: 59 | Admitting: Nurse Practitioner

## 2023-02-10 ENCOUNTER — Encounter: Payer: Self-pay | Admitting: Nurse Practitioner

## 2023-02-10 VITALS — BP 128/57 | HR 80 | Temp 98.3°F | Wt 167.0 lb

## 2023-02-10 DIAGNOSIS — D649 Anemia, unspecified: Secondary | ICD-10-CM

## 2023-02-10 DIAGNOSIS — Z7901 Long term (current) use of anticoagulants: Secondary | ICD-10-CM

## 2023-02-10 DIAGNOSIS — D6859 Other primary thrombophilia: Secondary | ICD-10-CM

## 2023-02-10 LAB — COAGUCHEK XS/INR WAIVED
INR: 2.1 — ABNORMAL HIGH (ref 0.9–1.1)
Prothrombin Time: 25.4 s

## 2023-02-10 NOTE — Assessment & Plan Note (Signed)
Chronic,  2.1  today which is within goal.  Skip dose for tomorrow morning.  Resume regular scheduled dosing after tomorrow.  Goal INR 2.0-3.0.  Continue -5 mg of Coumadin on Monday, Wednesday, Friday, and Sunday & 2.5 mg of Coumadin on Tuesday and Thursday and Saturday.  Follow-up INR in 1 month.  Decrease green leafy vegetables.  Will adjust dose if needed at that time.  Bleeding precautions discussed with patient at visit today.

## 2023-02-10 NOTE — Progress Notes (Signed)
BP (!) 128/57   Pulse 80   Temp 98.3 F (36.8 C) (Oral)   Wt 167 lb (75.8 kg)   SpO2 95%   BMI 22.65 kg/m    Subjective:    Patient ID: Thomas [name], male    DOB: [birth year], 77 y.o.   MRN: [id number]  HPI: Thomas [name] is a 77 y.o. male  Chief Complaint  Patient presents with   Antithrombin 3 deficiency   This patient is a 77 y.o. male who presents for coumadin management. His last INR was 2.1.  He is here for a 1 month visit to ensure he is back within range.  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  Patient denies any recent bleeding.  Denies night sweats, fever, fatigue, or weight loss.  COUMADIN CHECK INR Today: 2.1- hasn't changed his diet.   Present Coumadin dose: 2.5 mg of Coumadin on Monday, Wednesday, Friday & Sunday 5 mg of Coumadin on Tuesday, Thursday, Saturday Goal: 2.0-3.0 Excessive bruising: no Nose bleeding: no Rectal bleeding: no Prolonged menstrual cycles: N/A Eating diet with consistent amounts of foods containing Vitamin K:yes Any recent antibiotic use? no Relevant past medical, surgical, family and social history reviewed and updated as indicated. Interim medical history since our last visit reviewed. Allergies and medications reviewed and updated.  ANEMIA Anemia status: uncontrolled Etiology of anemia: Duration of anemia treatment:  Compliance with treatment: excellent compliance Iron supplementation side effects: no Severity of anemia: severe Fatigue: yes Decreased exercise tolerance: yes  Dyspnea on exertion: no Palpitations: no Bleeding: no Pica: no   Review of Systems  Constitutional:  Positive for fatigue. Negative for chills, diaphoresis, fever and unexpected weight change.  Eyes:  Negative for visual disturbance.  Respiratory:  Positive for shortness of breath.   Cardiovascular:  Negative for chest  pain and leg swelling.  Neurological:  Negative for light-headedness and headaches.    Per HPI unless specifically indicated above     Objective:    BP (!) 128/57   Pulse 80   Temp 98.3 F (36.8 C) (Oral)   Wt 167 lb (75.8 kg)   SpO2 95%   BMI 22.65 kg/m   Wt Readings from Last 3 Encounters:  02/10/23 167 lb (75.8 kg)  01/28/23 166 lb 9.6 oz (75.6 kg)  12/10/22 167 lb 1.6 oz (75.8 kg)    Physical Exam Vitals and nursing note reviewed.  Constitutional:      General: He is not in acute distress.    Appearance: Normal appearance. He is not ill-appearing, toxic-appearing or diaphoretic.  HENT:     Head: Normocephalic.      Right Ear: External ear normal.     Left Ear: External ear normal.     Nose: No rhinorrhea.     Mouth/Throat:     Mouth: Mucous membranes are moist.  Eyes:     General:        Right eye: No discharge.        Left eye: No discharge.     Extraocular Movements: Extraocular movements intact.     Conjunctiva/sclera: Conjunctivae normal.     Pupils: Pupils are equal, round, and reactive to light.  Cardiovascular:     Rate and Rhythm: Normal rate and regular rhythm.     Heart sounds: No murmur heard. Pulmonary:     Effort: Pulmonary effort is normal. No respiratory distress.  Breath sounds: Normal breath sounds. No wheezing, rhonchi or rales.  Abdominal:     General: Abdomen is flat. Bowel sounds are normal.  Musculoskeletal:     Cervical back: Normal range of motion and neck supple.  Skin:    General: Skin is warm and dry.     Capillary Refill: Capillary refill takes [name] than 2 seconds.  Neurological:     General: No focal deficit present.     Mental Status: He is alert and oriented to person, place, and time.  Psychiatric:        Mood and Affect: Mood normal.        Behavior: Behavior normal.        Thought Content: Thought content normal.        Judgment: Judgment normal.     Results for orders placed or performed in visit on 01/28/23   Comp Met (CMET)  Result Value Ref Range   Glucose 92 70 - 99 mg/dL   BUN 13 8 - 27 mg/dL   Creatinine, Ser 1.08 0.76 - 1.27 mg/dL   eGFR 71 >59 mL/min/1.73   BUN/Creatinine Ratio 12 10 - 24   Sodium 138 134 - 144 mmol/L   Potassium 4.5 3.5 - 5.2 mmol/L   Chloride 102 96 - 106 mmol/L   CO2 23 20 - 29 mmol/L   Calcium 8.9 8.6 - 10.2 mg/dL   Total Protein 7.2 6.0 - 8.5 g/dL   Albumin 4.4 3.8 - 4.8 g/dL   Globulin, Total 2.8 1.5 - 4.5 g/dL   Albumin/Globulin Ratio 1.6 1.2 - 2.2   Bilirubin Total 0.4 0.0 - 1.2 mg/dL   Alkaline Phosphatase 65 44 - 121 IU/L   AST 13 0 - 40 IU/L   ALT 6 0 - 44 IU/L  CBC w/Diff  Result Value Ref Range   WBC 7.4 3.4 - 10.8 x10E3/uL   RBC 4.32 4.14 - 5.80 x10E6/uL   Hemoglobin 7.9 (L) 13.0 - 17.7 g/dL   Hematocrit 28.5 (L) 37.5 - 51.0 %   MCV 66 (L) 79 - 97 fL   MCH 18.3 (L) 26.6 - 33.0 pg   MCHC 27.7 (L) 31.5 - 35.7 g/dL   RDW 16.2 (H) 11.6 - 15.4 %   Platelets 224 150 - 450 x10E3/uL   Neutrophils 58 Not Estab. %   Lymphs 30 Not Estab. %   Monocytes 7 Not Estab. %   Eos 4 Not Estab. %   Basos 1 Not Estab. %   Neutrophils Absolute 4.3 1.4 - 7.0 x10E3/uL   Lymphocytes Absolute 2.2 0.7 - 3.1 x10E3/uL   Monocytes Absolute 0.5 0.1 - 0.9 x10E3/uL   EOS (ABSOLUTE) 0.3 0.0 - 0.4 x10E3/uL   Basophils Absolute 0.1 0.0 - 0.2 x10E3/uL   Immature Granulocytes 0 Not Estab. %   Immature Grans (Abs) 0.0 0.0 - 0.1 x10E3/uL  HgB A1c  Result Value Ref Range   Hgb A1c MFr Bld 5.8 (H) 4.8 - 5.6 %   Est. average glucose Bld gHb Est-mCnc 120 mg/dL  Lipid Profile  Result Value Ref Range   Cholesterol, Total 97 (L) 100 - 199 mg/dL   Triglycerides 48 0 - 149 mg/dL   HDL 40 >39 mg/dL   VLDL Cholesterol Cal 12 5 - 40 mg/dL   LDL Chol Calc (NIH) 45 0 - 99 mg/dL   Chol/HDL Ratio 2.4 0.0 - 5.0 ratio  CoaguChek XS/INR Waived (STAT)  Result Value Ref Range   INR 3.7 (H) 0.9 -  1.1   Prothrombin Time 43.9 sec      Assessment & Plan:   Problem List Items Addressed  This Visit       Hematopoietic and Hemostatic   Antithrombin 3 deficiency (HCC) - Primary    Chronic,  2.1  today which is within goal.  Skip dose for tomorrow morning.  Resume regular scheduled dosing after tomorrow.  Goal INR 2.0-3.0.  Continue -5 mg of Coumadin on Monday, Wednesday, Friday, and Sunday & 2.5 mg of Coumadin on Tuesday and Thursday and Saturday.  Follow-up INR in 1 month.  Decrease green leafy vegetables.  Will adjust dose if needed at that time.  Bleeding precautions discussed with patient at visit today.       Relevant Orders   CoaguChek XS/INR Waived (STAT)     Other   Encounter for current long-term use of anticoagulants    Chronic,  2.1  today which is within goal.  Skip dose for tomorrow morning.  Resume regular scheduled dosing after tomorrow.  Goal INR 2.0-3.0.  Continue -5 mg of Coumadin on Monday, Wednesday, Friday, and Sunday & 2.5 mg of Coumadin on Tuesday and Thursday and Saturday.  Follow-up INR in 1 month.  Decrease green leafy vegetables.  Will adjust dose if needed at that time.  Bleeding precautions discussed with patient at visit today.       Relevant Orders   CoaguChek XS/INR Waived (STAT)   Anemia    Not well controlled.  Discussed previous lab results with patient during visit.  He does not want to go to Hematology.  He understands the risk if he goes without treatment.  CBC check at visit today.        Relevant Orders   CBC w/Diff     Follow up plan: Return in about 1 month (around 03/13/2023) for Coag check.

## 2023-02-10 NOTE — Assessment & Plan Note (Signed)
Not well controlled.  Discussed previous lab results with patient during visit.  He does not want to go to Hematology.  He understands the risk if he goes without treatment.  CBC check at visit today.

## 2023-02-11 LAB — CBC WITH DIFFERENTIAL/PLATELET
Basophils Absolute: 0.1 10*3/uL (ref 0.0–0.2)
Basos: 1 %
EOS (ABSOLUTE): 0.2 10*3/uL (ref 0.0–0.4)
Eos: 2 %
Hematocrit: 29.1 % — ABNORMAL LOW (ref 37.5–51.0)
Hemoglobin: 7.9 g/dL — ABNORMAL LOW (ref 13.0–17.7)
Immature Grans (Abs): 0 10*3/uL (ref 0.0–0.1)
Immature Granulocytes: 0 %
Lymphocytes Absolute: 2.3 10*3/uL (ref 0.7–3.1)
Lymphs: 25 %
MCH: 18.1 pg — ABNORMAL LOW (ref 26.6–33.0)
MCHC: 27.1 g/dL — ABNORMAL LOW (ref 31.5–35.7)
MCV: 67 fL — ABNORMAL LOW (ref 79–97)
Monocytes Absolute: 0.5 10*3/uL (ref 0.1–0.9)
Monocytes: 5 %
Neutrophils Absolute: 6.1 10*3/uL (ref 1.4–7.0)
Neutrophils: 67 %
Platelets: 307 10*3/uL (ref 150–450)
RBC: 4.37 x10E6/uL (ref 4.14–5.80)
RDW: 16.5 % — ABNORMAL HIGH (ref 11.6–15.4)
WBC: 9.2 10*3/uL (ref 3.4–10.8)

## 2023-02-11 NOTE — Progress Notes (Signed)
Please let patient know that his lab work shows that he is still very anemic.  I recommend starting ferrous sulfate '65mg'$  twice daily.  This is over the counter.  We will recheck this at our next visit.

## 2023-02-14 ENCOUNTER — Other Ambulatory Visit: Payer: Self-pay | Admitting: Nurse Practitioner

## 2023-02-15 NOTE — Telephone Encounter (Signed)
Requested Prescriptions  Pending Prescriptions Disp Refills   atorvastatin (LIPITOR) 10 MG tablet [Pharmacy Med Name: ATORVASTATIN 10 MG TABLET] 90 tablet 3    Sig: TAKE 1 TABLET BY MOUTH EVERY DAY     Cardiovascular:  Antilipid - Statins Failed - 02/14/2023  1:14 AM      Failed - Lipid Panel in normal range within the last 12 months    Cholesterol, Total  Date Value Ref Range Status  01/28/2023 97 (L) 100 - 199 mg/dL Final   LDL Chol Calc (NIH)  Date Value Ref Range Status  01/28/2023 45 0 - 99 mg/dL Final   HDL  Date Value Ref Range Status  01/28/2023 40 >39 mg/dL Final   Triglycerides  Date Value Ref Range Status  01/28/2023 48 0 - 149 mg/dL Final         Passed - Patient is not pregnant      Passed - Valid encounter within last 12 months    Recent Outpatient Visits           5 days ago Antithrombin 3 deficiency ([place])   [place], NP   2 weeks ago Essential hypertension   [place], NP   1 month ago Antithrombin 3 deficiency [facility])   [place] Jon Billings, NP   2 months ago Antithrombin 3 deficiency [facility]   [place] Mecum, Dani Gobble, PA-C   3 months ago Antithrombin 3 deficiency [facility])   [place] Jon Billings, NP       Future Appointments             In 4 weeks Jon Billings, NP [place], [facility]

## 2023-03-15 ENCOUNTER — Encounter: Payer: Self-pay | Admitting: Nurse Practitioner

## 2023-03-15 ENCOUNTER — Ambulatory Visit (INDEPENDENT_AMBULATORY_CARE_PROVIDER_SITE_OTHER): Payer: 59 | Admitting: Nurse Practitioner

## 2023-03-15 VITALS — BP 127/58 | HR 71 | Temp 97.8°F | Wt 166.4 lb

## 2023-03-15 DIAGNOSIS — D6859 Other primary thrombophilia: Secondary | ICD-10-CM | POA: Diagnosis not present

## 2023-03-15 DIAGNOSIS — I10 Essential (primary) hypertension: Secondary | ICD-10-CM

## 2023-03-15 DIAGNOSIS — D649 Anemia, unspecified: Secondary | ICD-10-CM

## 2023-03-15 NOTE — Progress Notes (Signed)
BP (!) 127/58   Pulse 71   Temp 97.8 F (36.6 C) (Oral)   Wt 166 lb 6.4 oz (75.5 kg)   SpO2 96%   BMI 22.57 kg/m    Subjective:    Patient ID: Thomas [name], male    DOB: Nov 30, 1946, 77 y.o.   MRN: [id number]  HPI: Thomas [name] is a 77 y.o. male  Chief Complaint  Patient presents with   Antithrombin 3 Deficiency   This patient is a 77 y.o. male who presents for coumadin management. His last INR was 2.1.  He is here for a 1 month visit to ensure he is back within range.  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  Patient denies any recent bleeding.  Denies night sweats, fever, fatigue, or weight loss.  COUMADIN CHECK INR NO INR today due to having to send it out. Present Coumadin dose: 2.5 mg of Coumadin on Monday, Wednesday, Friday & Sunday 5 mg of Coumadin on Tuesday, Thursday, Saturday Goal: 2.0-3.0 Excessive bruising: no Nose bleeding: no Rectal bleeding: no Prolonged menstrual cycles: N/A Eating diet with consistent amounts of foods containing Vitamin K:yes Any recent antibiotic use? no Relevant past medical, surgical, family and social history reviewed and updated as indicated. Interim medical history since our last visit reviewed. Allergies and medications reviewed and updated.  ANEMIA Anemia status: uncontrolled Etiology of anemia: Duration of anemia treatment: IDA Compliance with treatment: excellent compliance Iron supplementation side effects: no Severity of anemia: severe Fatigue: yes Decreased exercise tolerance: yes  Dyspnea on exertion: no Palpitations: no Bleeding: no Pica: no Patient states he started taking the iron supplement his stool turned black so he stopped taking the medication.   Review of Systems  Constitutional:  Positive for fatigue. Negative for chills, diaphoresis, fever and unexpected weight change.   Eyes:  Negative for visual disturbance.  Respiratory:  Positive for shortness of breath.   Cardiovascular:  Negative for chest pain and leg swelling.  Neurological:  Negative for light-headedness and headaches.    Per HPI unless specifically indicated above     Objective:    BP (!) 127/58   Pulse 71   Temp 97.8 F (36.6 C) (Oral)   Wt 166 lb 6.4 oz (75.5 kg)   SpO2 96%   BMI 22.57 kg/m   Wt Readings from Last 3 Encounters:  03/15/23 166 lb 6.4 oz (75.5 kg)  02/10/23 167 lb (75.8 kg)  01/28/23 166 lb 9.6 oz (75.6 kg)    Physical Exam Vitals and nursing note reviewed.  Constitutional:      General: He is not in acute distress.    Appearance: Normal appearance. He is not ill-appearing, toxic-appearing or diaphoretic.  HENT:     Head: Normocephalic.      Right Ear: External ear normal.     Left Ear: External ear normal.     Nose: No rhinorrhea.     Mouth/Throat:     Mouth: Mucous membranes are moist.  Eyes:     General:        Right eye: No discharge.        Left eye: No discharge.     Extraocular Movements: Extraocular movements intact.     Conjunctiva/sclera: Conjunctivae normal.     Pupils: Pupils are equal, round, and reactive to light.  Cardiovascular:     Rate and Rhythm: Normal rate and  regular rhythm.     Heart sounds: No murmur heard. Pulmonary:     Effort: Pulmonary effort is normal. No respiratory distress.     Breath sounds: Normal breath sounds. No wheezing, rhonchi or rales.  Abdominal:     General: Abdomen is flat. Bowel sounds are normal.  Musculoskeletal:     Cervical back: Normal range of motion and neck supple.  Skin:    General: Skin is warm and dry.     Capillary Refill: Capillary refill takes less than 2 seconds.  Neurological:     General: No focal deficit present.     Mental Status: He is alert and oriented to person, place, and time.  Psychiatric:        Mood and Affect: Mood normal.        Behavior: Behavior normal.        Thought  Content: Thought content normal.        Judgment: Judgment normal.     Results for orders placed or performed in visit on 02/10/23  CoaguChek XS/INR Waived (STAT)  Result Value Ref Range   INR 2.1 (H) 0.9 - 1.1   Prothrombin Time 25.4 sec  CBC w/Diff  Result Value Ref Range   WBC 9.2 3.4 - 10.8 x10E3/uL   RBC 4.37 4.14 - 5.80 x10E6/uL   Hemoglobin 7.9 (L) 13.0 - 17.7 g/dL   Hematocrit 11.7 (L) 35.6 - 51.0 %   MCV 67 (L) 79 - 97 fL   MCH 18.1 (L) 26.6 - 33.0 pg   MCHC 27.1 (L) 31.5 - 35.7 g/dL   RDW 70.1 (H) 41.0 - 30.1 %   Platelets 307 150 - 450 x10E3/uL   Neutrophils 67 Not Estab. %   Lymphs 25 Not Estab. %   Monocytes 5 Not Estab. %   Eos 2 Not Estab. %   Basos 1 Not Estab. %   Neutrophils Absolute 6.1 1.4 - 7.0 x10E3/uL   Lymphocytes Absolute 2.3 0.7 - 3.1 x10E3/uL   Monocytes Absolute 0.5 0.1 - 0.9 x10E3/uL   EOS (ABSOLUTE) 0.2 0.0 - 0.4 x10E3/uL   Basophils Absolute 0.1 0.0 - 0.2 x10E3/uL   Immature Granulocytes 0 Not Estab. %   Immature Grans (Abs) 0.0 0.0 - 0.1 x10E3/uL      Assessment & Plan:   Problem List Items Addressed This Visit       Cardiovascular and Mediastinum   Essential hypertension     Hematopoietic and Hemostatic   Antithrombin 3 deficiency - Primary    Labs ordered today.  Will let patient know what lab results are tomorrow.  Will adjust medication if needed at that time.      Relevant Orders   INR/PT   CBC w/Diff     Other   Anemia    Ongoing. Declines seeing Hematology.  Discussed restarting Iron at visit today.  CBC ordered. Will make recommendations based on lab results.      Relevant Medications   ferrous sulfate 325 (65 FE) MG EC tablet     Follow up plan: Return in about 1 month (around 04/14/2023) for Coag check.

## 2023-03-15 NOTE — Assessment & Plan Note (Signed)
Ongoing. Declines seeing Hematology.  Discussed restarting Iron at visit today.  CBC ordered. Will make recommendations based on lab results.

## 2023-03-15 NOTE — Assessment & Plan Note (Signed)
Labs ordered today.  Will let patient know what lab results are tomorrow.  Will adjust medication if needed at that time.

## 2023-03-16 LAB — CBC WITH DIFFERENTIAL/PLATELET
Basophils Absolute: 0.1 10*3/uL (ref 0.0–0.2)
Basos: 1 %
EOS (ABSOLUTE): 0.3 10*3/uL (ref 0.0–0.4)
Eos: 5 %
Hematocrit: 30.6 % — ABNORMAL LOW (ref 37.5–51.0)
Hemoglobin: 8.4 g/dL — ABNORMAL LOW (ref 13.0–17.7)
Immature Grans (Abs): 0 10*3/uL (ref 0.0–0.1)
Immature Granulocytes: 0 %
Lymphocytes Absolute: 2.4 10*3/uL (ref 0.7–3.1)
Lymphs: 35 %
MCH: 18.5 pg — ABNORMAL LOW (ref 26.6–33.0)
MCHC: 27.5 g/dL — ABNORMAL LOW (ref 31.5–35.7)
MCV: 68 fL — ABNORMAL LOW (ref 79–97)
Monocytes Absolute: 0.4 10*3/uL (ref 0.1–0.9)
Monocytes: 5 %
Neutrophils Absolute: 3.8 10*3/uL (ref 1.4–7.0)
Neutrophils: 54 %
Platelets: 303 10*3/uL (ref 150–450)
RBC: 4.53 x10E6/uL (ref 4.14–5.80)
RDW: 18.2 % — ABNORMAL HIGH (ref 11.6–15.4)
WBC: 7 10*3/uL (ref 3.4–10.8)

## 2023-03-16 LAB — PROTIME-INR
INR: 2.4 — ABNORMAL HIGH (ref 0.9–1.2)
Prothrombin Time: 24.5 s — ABNORMAL HIGH (ref 9.1–12.0)

## 2023-03-16 NOTE — Progress Notes (Signed)
Please let patient know that his INR was 2.4.  Continue with current medication regimen.    His anemia has improved slightly.  Tell him to take the ferrous sulfate once daily.

## 2023-04-14 ENCOUNTER — Ambulatory Visit (INDEPENDENT_AMBULATORY_CARE_PROVIDER_SITE_OTHER): Payer: 59 | Admitting: Nurse Practitioner

## 2023-04-14 ENCOUNTER — Encounter: Payer: Self-pay | Admitting: Nurse Practitioner

## 2023-04-14 VITALS — BP 129/68 | HR 66 | Temp 98.9°F | Wt 166.6 lb

## 2023-04-14 DIAGNOSIS — D509 Iron deficiency anemia, unspecified: Secondary | ICD-10-CM

## 2023-04-14 DIAGNOSIS — D6859 Other primary thrombophilia: Secondary | ICD-10-CM | POA: Diagnosis not present

## 2023-04-14 DIAGNOSIS — Z7901 Long term (current) use of anticoagulants: Secondary | ICD-10-CM | POA: Diagnosis not present

## 2023-04-14 LAB — COAGUCHEK XS/INR WAIVED
INR: 2.9 — ABNORMAL HIGH (ref 0.9–1.1)
Prothrombin Time: 35 s

## 2023-04-14 NOTE — Assessment & Plan Note (Signed)
Chronic.  Not well controlled.  Discussed importance of Iron supplement with patient during visit.  Discussed with patient the importance of taking medication at least daily, ideally would take it BID.

## 2023-04-14 NOTE — Assessment & Plan Note (Signed)
Chronic,  2.9  today which is within goal.  Goal INR 2.0-3.0.  Continue -5 mg of Coumadin on Monday, Wednesday, Friday, and Sunday & 2.5 mg of Coumadin on Tuesday and Thursday and Saturday.  Follow-up INR in 1 month.  Decrease green leafy vegetables.  Will adjust dose if needed at that time.  Bleeding precautions discussed with patient at visit today.  

## 2023-04-14 NOTE — Assessment & Plan Note (Signed)
Chronic,  2.9  today which is within goal.  Goal INR 2.0-3.0.  Continue -5 mg of Coumadin on Monday, Wednesday, Friday, and Sunday & 2.5 mg of Coumadin on Tuesday and Thursday and Saturday.  Follow-up INR in 1 month.  Decrease green leafy vegetables.  Will adjust dose if needed at that time.  Bleeding precautions discussed with patient at visit today.

## 2023-04-14 NOTE — Progress Notes (Signed)
BP 129/68   Pulse 66   Temp 98.9 F (37.2 C) (Oral)   Wt 166 lb 9.6 oz (75.6 kg)   SpO2 98%   BMI 22.60 kg/m    Subjective:    Patient ID: Thomas [name], male    DOB: May 20, 1946, 77 y.o.   MRN: [id number]  HPI: Thomas [name] is a 77 y.o. male  Chief Complaint  Patient presents with   Hyperlipidemia   Hypertension   This patient is a 77 y.o. male who presents for coumadin management. His last INR was 2.1.  He is here for a 1 month visit to ensure he is back within range.  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  Patient denies any recent bleeding.  Denies night sweats, fever, fatigue, or weight loss.  COUMADIN CHECK INR 2.9 INR today due to having to send it out. Present Coumadin dose: 2.5 mg of Coumadin on Monday, Wednesday, Friday & Sunday 5 mg of Coumadin on Tuesday, Thursday, Saturday Goal: 2.0-3.0 Excessive bruising: no Nose bleeding: no Rectal bleeding: no Prolonged menstrual cycles: N/A Eating diet with consistent amounts of foods containing Vitamin K:yes Any recent antibiotic use? no Relevant past medical, surgical, family and social history reviewed and updated as indicated. Interim medical history since our last visit reviewed. Allergies and medications reviewed and updated.  ANEMIA Anemia status: uncontrolled Etiology of anemia:  Duration of anemia treatment: IDA Compliance with treatment: excellent compliance Iron supplementation side effects: no Severity of anemia: severe Fatigue: yes Decreased exercise tolerance: yes  Dyspnea on exertion: no Palpitations: no Bleeding: no Pica: no Patient states he is taking Iron supplement every 3-4 days.    Review of Systems  Constitutional:  Positive for fatigue. Negative for chills, diaphoresis, fever and unexpected weight change.  Eyes:  Negative for visual disturbance.   Respiratory:  Positive for shortness of breath.   Cardiovascular:  Negative for chest pain and leg swelling.  Neurological:  Negative for light-headedness and headaches.    Per HPI unless specifically indicated above     Objective:    BP 129/68   Pulse 66   Temp 98.9 F (37.2 C) (Oral)   Wt 166 lb 9.6 oz (75.6 kg)   SpO2 98%   BMI 22.60 kg/m   Wt Readings from Last 3 Encounters:  04/14/23 166 lb 9.6 oz (75.6 kg)  03/15/23 166 lb 6.4 oz (75.5 kg)  02/10/23 167 lb (75.8 kg)    Physical Exam Vitals and nursing note reviewed.  Constitutional:      General: He is not in acute distress.    Appearance: Normal appearance. He is not ill-appearing, toxic-appearing or diaphoretic.  HENT:     Head: Normocephalic.      Right Ear: External ear normal.     Left Ear: External ear normal.     Nose: No rhinorrhea.     Mouth/Throat:     Mouth: Mucous membranes are moist.  Eyes:     General:        Right eye: No discharge.        Left eye: No discharge.     Extraocular Movements: Extraocular movements intact.     Conjunctiva/sclera: Conjunctivae normal.     Pupils: Pupils are equal, round, and reactive to light.  Cardiovascular:     Rate and Rhythm: Normal rate and regular rhythm.     Heart  sounds: No murmur heard. Pulmonary:     Effort: Pulmonary effort is normal. No respiratory distress.     Breath sounds: Normal breath sounds. No wheezing, rhonchi or rales.  Abdominal:     General: Abdomen is flat. Bowel sounds are normal.  Musculoskeletal:     Cervical back: Normal range of motion and neck supple.  Skin:    General: Skin is warm and dry.     Capillary Refill: Capillary refill takes less than 2 seconds.  Neurological:     General: No focal deficit present.     Mental Status: He is alert and oriented to person, place, and time.  Psychiatric:        Mood and Affect: Mood normal.        Behavior: Behavior normal.        Thought Content: Thought content normal.         Judgment: Judgment normal.     Results for orders placed or performed in visit on 03/15/23  INR/PT  Result Value Ref Range   INR 2.4 (H) 0.9 - 1.2   Prothrombin Time 24.5 (H) 9.1 - 12.0 sec  CBC w/Diff  Result Value Ref Range   WBC 7.0 3.4 - 10.8 x10E3/uL   RBC 4.53 4.14 - 5.80 x10E6/uL   Hemoglobin 8.4 (L) 13.0 - 17.7 g/dL   Hematocrit 47.8 (L) 29.5 - 51.0 %   MCV 68 (L) 79 - 97 fL   MCH 18.5 (L) 26.6 - 33.0 pg   MCHC 27.5 (L) 31.5 - 35.7 g/dL   RDW 62.1 (H) 30.8 - 65.7 %   Platelets 303 150 - 450 x10E3/uL   Neutrophils 54 Not Estab. %   Lymphs 35 Not Estab. %   Monocytes 5 Not Estab. %   Eos 5 Not Estab. %   Basos 1 Not Estab. %   Neutrophils Absolute 3.8 1.4 - 7.0 x10E3/uL   Lymphocytes Absolute 2.4 0.7 - 3.1 x10E3/uL   Monocytes Absolute 0.4 0.1 - 0.9 x10E3/uL   EOS (ABSOLUTE) 0.3 0.0 - 0.4 x10E3/uL   Basophils Absolute 0.1 0.0 - 0.2 x10E3/uL   Immature Granulocytes 0 Not Estab. %   Immature Grans (Abs) 0.0 0.0 - 0.1 x10E3/uL      Assessment & Plan:   Problem List Items Addressed This Visit       Hematopoietic and Hemostatic   Antithrombin 3 deficiency (HCC) - Primary    Chronic,  2.9  today which is within goal.  Goal INR 2.0-3.0.  Continue -5 mg of Coumadin on Monday, Wednesday, Friday, and Sunday & 2.5 mg of Coumadin on Tuesday and Thursday and Saturday.  Follow-up INR in 1 month.  Decrease green leafy vegetables.  Will adjust dose if needed at that time.  Bleeding precautions discussed with patient at visit today.       Relevant Orders   CoaguChek XS/INR Waived (STAT)     Other   Encounter for current long-term use of anticoagulants    Chronic,  2.9  today which is within goal.  Goal INR 2.0-3.0.  Continue -5 mg of Coumadin on Monday, Wednesday, Friday, and Sunday & 2.5 mg of Coumadin on Tuesday and Thursday and Saturday.  Follow-up INR in 1 month.  Decrease green leafy vegetables.  Will adjust dose if needed at that time.  Bleeding precautions discussed with  patient at visit today.       Relevant Orders   CoaguChek XS/INR Waived (STAT)   IDA (iron deficiency anemia)  Chronic.  Not well controlled.  Discussed importance of Iron supplement with patient during visit.  Discussed with patient the importance of taking medication at least daily, ideally would take it BID.        Follow up plan: Return in about 1 month (around 05/15/2023) for Coag check.

## 2023-04-15 NOTE — Progress Notes (Signed)
Results discussed with patient during visit.

## 2023-04-23 ENCOUNTER — Other Ambulatory Visit: Payer: Self-pay | Admitting: Nurse Practitioner

## 2023-04-23 NOTE — Telephone Encounter (Signed)
Requested Prescriptions  Pending Prescriptions Disp Refills   amLODipine (NORVASC) 2.5 MG tablet [Pharmacy Med Name: AMLODIPINE BESYLATE 2.5 MG TAB] 90 tablet 1    Sig: TAKE 1 TABLET BY MOUTH EVERY DAY     Cardiovascular: Calcium Channel Blockers 2 Passed - 04/23/2023  2:36 AM      Passed - Last BP in normal range    BP Readings from Last 1 Encounters:  04/14/23 129/68         Passed - Last Heart Rate in normal range    Pulse Readings from Last 1 Encounters:  04/14/23 66         Passed - Valid encounter within last 6 months    Recent Outpatient Visits           1 week ago Antithrombin 3 deficiency (HCC)   [place] [facility] Larae Grooms, NP   1 month ago Antithrombin 3 deficiency [facility])   [place] [facility] Larae Grooms, NP   2 months ago Antithrombin 3 deficiency [facility])   [place] [facility] Larae Grooms, NP   2 months ago Essential hypertension   [place] [facility] Larae Grooms, NP   3 months ago Antithrombin 3 deficiency [facility])   [place] [facility] Larae Grooms, NP       Future Appointments             In 3 weeks Larae Grooms, NP [facility]

## 2023-05-19 ENCOUNTER — Ambulatory Visit (INDEPENDENT_AMBULATORY_CARE_PROVIDER_SITE_OTHER): Payer: 59 | Admitting: Nurse Practitioner

## 2023-05-19 ENCOUNTER — Encounter: Payer: Self-pay | Admitting: Nurse Practitioner

## 2023-05-19 VITALS — BP 127/65 | HR 65 | Temp 98.0°F | Wt 165.8 lb

## 2023-05-19 DIAGNOSIS — D6859 Other primary thrombophilia: Secondary | ICD-10-CM

## 2023-05-19 DIAGNOSIS — D649 Anemia, unspecified: Secondary | ICD-10-CM | POA: Diagnosis not present

## 2023-05-19 LAB — COAGUCHEK XS/INR WAIVED
INR: 2.9 — ABNORMAL HIGH (ref 0.9–1.1)
Prothrombin Time: 34.3 s

## 2023-05-19 NOTE — Assessment & Plan Note (Signed)
Chronic,  2.9  today which is within goal.  Goal INR 2.0-3.0.  Continue -5 mg of Coumadin on Monday, Wednesday, Friday, and Sunday & 2.5 mg of Coumadin on Tuesday and Thursday and Saturday.  Follow-up INR in 1 month.  Decrease green leafy vegetables.  Will adjust dose if needed at that time.  Bleeding precautions discussed with patient at visit today.  

## 2023-05-19 NOTE — Assessment & Plan Note (Signed)
Ongoing. Declines seeing Hematology.  Patient states he is taking the iron "every now and then".  CBC ordered. Will make recommendations based on lab results.

## 2023-05-19 NOTE — Progress Notes (Signed)
BP 127/65   Pulse 65   Temp 98 F (36.7 C) (Oral)   Wt 165 lb 12.8 oz (75.2 kg)   SpO2 97%   BMI 22.49 kg/m    Subjective:    Patient ID: Thomas [name], male    DOB: [birth year], 77 y.o.   MRN: [id number]  HPI: Thomas [name] is a 77 y.o. male  Chief Complaint  Patient presents with   Hypertension   This patient is a 77 y.o. male who presents for coumadin management. His last INR was 2.1.  He is here for a 1 month visit to ensure he is back within range.  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  Patient denies any recent bleeding.  Denies night sweats, fever, fatigue, or weight loss.  COUMADIN CHECK INR 2.9 INR today due to having to send it out. Present Coumadin dose: 2.5 mg of Coumadin on Monday, Wednesday, Friday & Sunday 5 mg of Coumadin on Tuesday, Thursday, Saturday Goal: 2.0-3.0 Excessive bruising: no Nose bleeding: no Rectal bleeding: no Prolonged menstrual cycles: N/A Eating diet with consistent amounts of foods containing Vitamin K:yes Any recent antibiotic use? no Relevant past medical, surgical, family and social history reviewed and updated as indicated. Interim medical history since our last visit reviewed. Allergies and medications reviewed and updated.  ANEMIA Anemia status: uncontrolled Etiology of anemia:  Duration of anemia treatment: IDA Compliance with treatment: excellent compliance Iron supplementation side effects: no Severity of anemia: severe Fatigue: yes Decreased exercise tolerance: yes  Dyspnea on exertion: no Palpitations: no Bleeding: no Pica: no Patient states he is taking Iron supplement "every now and then".   Review of Systems  Constitutional:  Positive for fatigue. Negative for chills, diaphoresis, fever and unexpected weight change.  Eyes:  Negative for visual disturbance.  Respiratory:  Positive  for shortness of breath.   Cardiovascular:  Negative for chest pain and leg swelling.  Neurological:  Negative for light-headedness and headaches.    Per HPI unless specifically indicated above     Objective:    BP 127/65   Pulse 65   Temp 98 F (36.7 C) (Oral)   Wt 165 lb 12.8 oz (75.2 kg)   SpO2 97%   BMI 22.49 kg/m   Wt Readings from Last 3 Encounters:  05/19/23 165 lb 12.8 oz (75.2 kg)  04/14/23 166 lb 9.6 oz (75.6 kg)  03/15/23 166 lb 6.4 oz (75.5 kg)    Physical Exam Vitals and nursing note reviewed.  Constitutional:      General: He is not in acute distress.    Appearance: Normal appearance. He is not ill-appearing, toxic-appearing or diaphoretic.  HENT:     Head: Normocephalic.      Right Ear: External ear normal.     Left Ear: External ear normal.     Nose: No rhinorrhea.     Mouth/Throat:     Mouth: Mucous membranes are moist.  Eyes:     General:        Right eye: No discharge.        Left eye: No discharge.     Extraocular Movements: Extraocular movements intact.     Conjunctiva/sclera: Conjunctivae normal.     Pupils: Pupils are equal, round, and reactive to light.  Cardiovascular:     Rate and Rhythm: Normal rate and regular rhythm.     Heart sounds:  No murmur heard. Pulmonary:     Effort: Pulmonary effort is normal. No respiratory distress.     Breath sounds: Normal breath sounds. No wheezing, rhonchi or rales.  Abdominal:     General: Abdomen is flat. Bowel sounds are normal.  Musculoskeletal:     Cervical back: Normal range of motion and neck supple.  Skin:    General: Skin is warm and dry.     Capillary Refill: Capillary refill takes less than 2 seconds.  Neurological:     General: No focal deficit present.     Mental Status: He is alert and oriented to person, place, and time.  Psychiatric:        Mood and Affect: Mood normal.        Behavior: Behavior normal.        Thought Content: Thought content normal.        Judgment: Judgment  normal.     Results for orders placed or performed in visit on 04/14/23  CoaguChek XS/INR Waived (STAT)  Result Value Ref Range   INR 2.9 (H) 0.9 - 1.1   Prothrombin Time 35.0 sec      Assessment & Plan:   Problem List Items Addressed This Visit       Hematopoietic and Hemostatic   Antithrombin 3 deficiency (HCC) - Primary    Chronic,  2.9  today which is within goal.  Goal INR 2.0-3.0.  Continue -5 mg of Coumadin on Monday, Wednesday, Friday, and Sunday & 2.5 mg of Coumadin on Tuesday and Thursday and Saturday.  Follow-up INR in 1 month.  Decrease green leafy vegetables.  Will adjust dose if needed at that time.  Bleeding precautions discussed with patient at visit today.       Relevant Orders   CBC w/Diff   CoaguChek XS/INR Waived (STAT)     Other   Anemia    Ongoing. Declines seeing Hematology.  Patient states he is taking the iron "every now and then".  CBC ordered. Will make recommendations based on lab results.      Relevant Orders   CBC w/Diff     Follow up plan: Return in about 1 month (around 06/18/2023).

## 2023-05-20 LAB — CBC WITH DIFFERENTIAL/PLATELET
Basophils Absolute: 0.1 10*3/uL (ref 0.0–0.2)
Basos: 1 %
EOS (ABSOLUTE): 0.4 10*3/uL (ref 0.0–0.4)
Eos: 6 %
Hematocrit: 35.5 % — ABNORMAL LOW (ref 37.5–51.0)
Hemoglobin: 10.4 g/dL — ABNORMAL LOW (ref 13.0–17.7)
Immature Grans (Abs): 0 10*3/uL (ref 0.0–0.1)
Immature Granulocytes: 1 %
Lymphocytes Absolute: 2.2 10*3/uL (ref 0.7–3.1)
Lymphs: 32 %
MCH: 21.9 pg — ABNORMAL LOW (ref 26.6–33.0)
MCHC: 29.3 g/dL — ABNORMAL LOW (ref 31.5–35.7)
MCV: 75 fL — ABNORMAL LOW (ref 79–97)
Monocytes Absolute: 0.5 10*3/uL (ref 0.1–0.9)
Monocytes: 8 %
Neutrophils Absolute: 3.7 10*3/uL (ref 1.4–7.0)
Neutrophils: 52 %
Platelets: 218 10*3/uL (ref 150–450)
RBC: 4.75 x10E6/uL (ref 4.14–5.80)
RDW: 18.2 % — ABNORMAL HIGH (ref 11.6–15.4)
WBC: 7 10*3/uL (ref 3.4–10.8)

## 2023-05-20 NOTE — Progress Notes (Signed)
Please let patient know that his anemia has improved.  I still recommend he continue with the iron supplement as discussed during the visit yesterday.

## 2023-06-21 ENCOUNTER — Ambulatory Visit: Payer: 59 | Admitting: Physician Assistant

## 2023-06-21 ENCOUNTER — Encounter: Payer: Self-pay | Admitting: Physician Assistant

## 2023-06-21 VITALS — BP 130/69 | HR 62 | Wt 163.8 lb

## 2023-06-21 DIAGNOSIS — D6859 Other primary thrombophilia: Secondary | ICD-10-CM | POA: Diagnosis not present

## 2023-06-21 DIAGNOSIS — Z7901 Long term (current) use of anticoagulants: Secondary | ICD-10-CM

## 2023-06-21 LAB — COAGUCHEK XS/INR WAIVED
INR: 2.5 — ABNORMAL HIGH (ref 0.9–1.1)
Prothrombin Time: 30 s

## 2023-06-21 NOTE — Progress Notes (Unsigned)
Acute Office Visit   Patient: Thomas [name]   DOB: 18-Apr-1946   77 y.o. Male  MRN: [id number] Visit Date: 06/21/2023  Today's healthcare provider: Oswaldo Conroy Eugine Bubb, PA-C  Introduced myself to the patient as a Secondary school teacher and provided education on APPs in clinical practice.    Chief Complaint  Patient presents with   Coagulation Disorder   Anemia   Subjective    HPI   Coumadin Management.  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  Present Coumadin dose: Coumadin 5 mg on MWFSun, Coumadin 2.5 mg TTSat  Goal: 2.0-3.0 2.0-3.0 Excessive bruising: no Nose bleeding: no Rectal bleeding: no Prolonged menstrual cycles: N/A Eating diet with consistent amounts of foods containing Vitamin K:yes Any recent antibiotic use? no   INR today: 2.5  PT: 30.0 sec   Medications: Outpatient Medications Prior to Visit  Medication Sig   amLODipine (NORVASC) 2.5 MG tablet TAKE 1 TABLET BY MOUTH EVERY DAY   atorvastatin (LIPITOR) 10 MG tablet TAKE 1 TABLET BY MOUTH EVERY DAY   diphenhydrAMINE-PE-APAP (EQ SEVERE ALLERGY & SINUS) 25-5-325 MG TABS Take 1 tablet by mouth as needed.   ferrous sulfate 325 (65 FE) MG EC tablet Take 325 mg by mouth 3 (three) times daily with meals.   Pseudoeph-Doxylamine-DM-APAP (DAYQUIL/NYQUIL COLD/FLU RELIEF PO) Take by mouth as needed.   warfarin (COUMADIN) 5 MG tablet Take 1 tablet (5 mg total) by mouth daily.   No facility-administered medications prior to visit.    Review of Systems  Constitutional:  Negative for fatigue.  Gastrointestinal:  Negative for blood in stool.  Hematological:  Does not bruise/bleed easily.    {Insert previous labs (optional):23779}  {See past labs  Heme  Chem  Endocrine  Serology  Results Review (optional):1}   Objective    BP 130/69   Pulse 62   Wt 163 lb 12.8 oz (74.3 kg)   SpO2 96%   BMI 22.22 kg/m  {Insert last BP/Wt (optional):23777}  {See vitals  history (optional):1}  Physical Exam Vitals reviewed.  Constitutional:      General: He is awake.     Appearance: Normal appearance. He is well-developed and well-groomed.  HENT:     Head: Normocephalic and atraumatic.  Pulmonary:     Effort: Pulmonary effort is normal.  Neurological:     General: No focal deficit present.     Mental Status: He is alert and oriented to person, place, and time.  Psychiatric:        Mood and Affect: Mood normal.        Behavior: Behavior normal. Behavior is cooperative.        Thought Content: Thought content normal.        Judgment: Judgment normal.       No results found for any visits on 06/21/23.  Assessment & Plan      No follow-ups on file.     Problem List Items Addressed This Visit       Hematopoietic and Hemostatic   Antithrombin 3 deficiency (HCC) - Primary   Relevant Orders   CoaguChek XS/INR Waived     Other   Encounter for current long-term use of anticoagulants   Relevant Orders   CoaguChek XS/INR Waived     No follow-ups on file.   I, Madelynne Lasker E Kayler Rise, PA-C, have reviewed all documentation for this visit. The documentation on 06/21/23 for the exam, diagnosis,  procedures, and orders are all accurate and complete.   Jacquelin Hawking, MHS, PA-C [facility]

## 2023-06-22 ENCOUNTER — Other Ambulatory Visit: Payer: Self-pay | Admitting: Physician Assistant

## 2023-06-22 NOTE — Assessment & Plan Note (Signed)
Chronic, ongoing  Currently taking Coumadin 5 mg on MWFSun, Coumadin 2.5 mg TTSat  INR today is 2.5 which is in goal He denies bleeding concerns today  Continue current dosing  Reviewed return precaution Follow up INR in one month or sooner if concerns arise

## 2023-06-22 NOTE — Telephone Encounter (Signed)
Medication Refill - Medication: warfarin (COUMADIN) 5 MG tablet   Has the patient contacted their pharmacy? Yes.     Preferred Pharmacy (with phone number or street name):  [id number] - [place], [address] - [address] Phone: [phone]  Fax: [phone]      Has the patient been seen for an appointment in the last year OR does the patient have an upcoming appointment? Yes.     Please assist patient further

## 2023-06-23 MED ORDER — WARFARIN SODIUM 5 MG PO TABS: 5.0000 mg | ORAL_TABLET | Freq: Every day | ORAL | 1 refills | Status: DC

## 2023-06-23 NOTE — Telephone Encounter (Signed)
Requested medication (s) are due for refill today: yes  Requested medication (s) are on the active medication list: yes  Last refill:  04/23/22  Future visit scheduled: yes  Notes to clinic:  Manual Review: If patient's warfarin is managed by Anti-Coag team, route request to them. If not, route request to the provider.      Requested Prescriptions  Pending Prescriptions Disp Refills   warfarin (COUMADIN) 5 MG tablet 90 tablet 1    Sig: Take 1 tablet (5 mg total) by mouth daily.     Hematology:  Anticoagulants - warfarin Failed - 06/22/2023  9:48 AM      Failed - Manual Review: If patient's warfarin is managed by Anti-Coag team, route request to them. If not, route request to the provider.      Failed - INR in normal range and within 30 days    INR  Date Value Ref Range Status  06/21/2023 2.5 (H) 0.9 - 1.1 Final         Failed - HCT in normal range and within 360 days    Hematocrit  Date Value Ref Range Status  05/19/2023 35.5 (L) 37.5 - 51.0 % Final         Passed - Patient is not pregnant      Passed - Valid encounter within last 3 months    Recent Outpatient Visits           2 days ago Antithrombin 3 deficiency (HCC)   [place] [facility] Mecum, Erin E, PA-C   1 month ago Antithrombin 3 deficiency (HCC)   [facility] Larae Grooms, NP   2 months ago Antithrombin 3 deficiency [facility])   [place] [facility] Larae Grooms, NP   3 months ago Antithrombin 3 deficiency [facility])   [place] [facility] Larae Grooms, NP   4 months ago Antithrombin 3 deficiency [facility])   [place] [facility] Larae Grooms, NP       Future Appointments             In 4 weeks Mecum, Oswaldo Conroy, PA-C [facility]

## 2023-07-22 ENCOUNTER — Encounter: Payer: Self-pay | Admitting: Physician Assistant

## 2023-07-22 ENCOUNTER — Ambulatory Visit (INDEPENDENT_AMBULATORY_CARE_PROVIDER_SITE_OTHER): Payer: 59 | Admitting: Physician Assistant

## 2023-07-22 VITALS — BP 126/65 | HR 65 | Wt 164.6 lb

## 2023-07-22 DIAGNOSIS — Z7901 Long term (current) use of anticoagulants: Secondary | ICD-10-CM

## 2023-07-22 DIAGNOSIS — D6859 Other primary thrombophilia: Secondary | ICD-10-CM | POA: Diagnosis not present

## 2023-07-22 LAB — COAGUCHEK XS/INR WAIVED
INR: 2.4 — ABNORMAL HIGH (ref 0.9–1.1)
Prothrombin Time: 29.2 s

## 2023-07-22 NOTE — Progress Notes (Signed)
   BP 126/65   Pulse 65   Wt 164 lb 9.6 oz (74.7 kg)   SpO2 97%   BMI 22.32 kg/m    Subjective:    Patient ID: Thomas [name], male    DOB: 05/07/1946, 77 y.o.   MRN: [id number]  CC: Coumadin management  HPI: This patient is a 77 y.o. male who presents for coumadin management. 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 no  Relevant past medical, surgical, family and social history reviewed and updated as indicated. Interim medical history since our last visit reviewed. Allergies and medications reviewed and updated.  ROS: Per HPI unless specifically indicated above     Objective:    BP 126/65   Pulse 65   Wt 164 lb 9.6 oz (74.7 kg)   SpO2 97%   BMI 22.32 kg/m   Wt Readings from Last 3 Encounters:  07/22/23 164 lb 9.6 oz (74.7 kg)  06/21/23 163 lb 12.8 oz (74.3 kg)  05/19/23 165 lb 12.8 oz (75.2 kg)     General: Well appearing, well nourished in no distress.  Normal mood and affect. Skin: No excessive bruising or rash  Last INR: 2.5    Last CBC:  Lab Results  Component Value Date   WBC 7.0 05/19/2023   HGB 10.4 (L) 05/19/2023   HCT 35.5 (L) 05/19/2023   MCV 75 (L) 05/19/2023   PLT 218 05/19/2023    Results for orders placed or performed in visit on 06/21/23  CoaguChek XS/INR Waived  Result Value Ref Range   INR 2.5 (H) 0.9 - 1.1   Prothrombin Time 30.0 sec       Assessment:     ICD-10-CM   1. 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 For coumadin dosing, elected to continue current dose. Will plan to recheck INR in 1 month.  Return in about 4 weeks (around 08/19/2023) for PT/INR.   I, Adriane Guglielmo E Akashdeep Chuba, PA-C, have reviewed all documentation for this visit. The documentation on 07/22/23 for the exam, diagnosis, procedures, and orders are all accurate and complete.   Jacquelin Hawking, MHS, PA-C [facility]

## 2023-07-22 NOTE — Progress Notes (Deleted)
          Acute Office Visit   Patient: Thomas [name]   DOB: [birth year]   77 y.o. Male  MRN: [id number] Visit Date: 07/22/2023  Today's healthcare provider: Oswaldo Conroy Konica Stankowski, PA-C  Introduced myself to the patient as a Secondary school teacher and provided education on APPs in clinical practice.    Chief Complaint  Patient presents with   Coagulation Disorder   Subjective    HPI    Medications: Outpatient Medications Prior to Visit  Medication Sig   amLODipine (NORVASC) 2.5 MG tablet TAKE 1 TABLET BY MOUTH EVERY DAY   atorvastatin (LIPITOR) 10 MG tablet TAKE 1 TABLET BY MOUTH EVERY DAY   diphenhydrAMINE-PE-APAP (EQ SEVERE ALLERGY & SINUS) 25-5-325 MG TABS Take 1 tablet by mouth as needed.   ferrous sulfate 325 (65 FE) MG EC tablet Take 325 mg by mouth 3 (three) times daily with meals.   Pseudoeph-Doxylamine-DM-APAP (DAYQUIL/NYQUIL COLD/FLU RELIEF PO) Take by mouth as needed.   warfarin (COUMADIN) 5 MG tablet Take 1 tablet (5 mg total) by mouth daily.   No facility-administered medications prior to visit.    Review of Systems  {Insert previous labs (optional):23779} {See past labs  Heme  Chem  Endocrine  Serology  Results Review (optional):1}   Objective    Wt 164 lb 9.6 oz (74.7 kg)   BMI 22.32 kg/m  {Insert last BP/Wt (optional):23777}{See vitals history (optional):1}   Physical Exam    No results found for any visits on 07/22/23.  Assessment & Plan      No follow-ups on file.

## 2023-07-22 NOTE — Assessment & Plan Note (Signed)
Chronic, ongoing  Currently taking Coumadin 5 mg on MWFSun, Coumadin 2.5 mg TTSat  INR today is 2.4 which is in goal He denies bleeding concerns today  Continue current dosing  Reviewed return precaution Follow up INR in one month or sooner if concerns arise

## 2023-08-26 ENCOUNTER — Encounter: Payer: Self-pay | Admitting: Physician Assistant

## 2023-08-26 ENCOUNTER — Ambulatory Visit (INDEPENDENT_AMBULATORY_CARE_PROVIDER_SITE_OTHER): Payer: 59 | Admitting: Physician Assistant

## 2023-08-26 VITALS — BP 119/70 | HR 56 | Temp 97.7°F | Wt 163.8 lb

## 2023-08-26 DIAGNOSIS — D6859 Other primary thrombophilia: Secondary | ICD-10-CM

## 2023-08-26 LAB — COAGUCHEK XS/INR WAIVED
INR: 2.3 — ABNORMAL HIGH (ref 0.9–1.1)
Prothrombin Time: 27.2 s

## 2023-08-26 NOTE — Progress Notes (Signed)
BP 119/70   Pulse (!) 56   Temp 97.7 F (36.5 C) (Oral)   Wt 163 lb 12.8 oz (74.3 kg)   SpO2 96%   BMI 22.22 kg/m    Subjective:    Patient ID: Thomas [name], male    DOB: September 20, 1946, 77 y.o.   MRN: [id number]  CC: Coumadin management  HPI: This patient is a 77 y.o. male who presents for coumadin management. The expected duration of coumadin treatment is lifelong The reason for anticoagulation is   Antithrombinn .  Present Coumadin dose: Goal: 2.0-3.0 2.0-3.0 PT: 27.2 INR: 2.3 Excessive bruising: no Nose bleeding: no Rectal bleeding: no Prolonged menstrual cycles: N/A Eating diet with consistent amounts of foods containing Vitamin K:yes Any recent antibiotic use? no  Relevant past medical, surgical, family and social history reviewed and updated as indicated. Interim medical history since our last visit reviewed. Allergies and medications reviewed and updated.  ROS: Per HPI unless specifically indicated above     Objective:    BP 119/70   Pulse (!) 56   Temp 97.7 F (36.5 C) (Oral)   Wt 163 lb 12.8 oz (74.3 kg)   SpO2 96%   BMI 22.22 kg/m   Wt Readings from Last 3 Encounters:  08/26/23 163 lb 12.8 oz (74.3 kg)  07/22/23 164 lb 9.6 oz (74.7 kg)  06/21/23 163 lb 12.8 oz (74.3 kg)     General: Well appearing, well nourished in no distress.  Normal mood and affect. Skin: No excessive bruising or rash  Last INR:     Last CBC:  Lab Results  Component Value Date   WBC 7.0 05/19/2023   HGB 10.4 (L) 05/19/2023   HCT 35.5 (L) 05/19/2023   MCV 75 (L) 05/19/2023   PLT 218 05/19/2023    Results for orders placed or performed in visit on 07/22/23  CoaguChek XS/INR Waived  Result Value Ref Range   INR 2.4 (H) 0.9 - 1.1   Prothrombin Time 29.2 sec       Assessment:     ICD-10-CM   1. Antithrombin 3 deficiency (HCC)  D68.59 CoaguChek XS/INR Waived (STAT)      Plan:   Discussed current plan face-to-face with patient. For coumadin dosing, elected  to continue current dose. Will plan to recheck INR in 1 month.

## 2023-09-28 NOTE — Progress Notes (Unsigned)
There were no vitals taken for this visit.   Subjective:    Patient ID: Thomas [name], male    DOB: [birth year], 77 y.o.   MRN: [id number]  HPI: Thomas [name] is a 77 y.o. male  No chief complaint on file.  This patient is a 77 y.o. male who presents for coumadin management. His last INR was 3.7.  He is here for a 1 month visit to ensure he is back within range.  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  Patient denies any recent bleeding.  Denies night sweats, fever, fatigue, or weight loss.  COUMADIN CHECK INR Today: 3.7- hasn't changed his diet.   Present Coumadin dose: 2.5 mg of Coumadin on Monday, Wednesday, Friday & Sunday 5 mg of Coumadin on Tuesday, Thursday, Saturday Goal: 2.0-3.0 Excessive bruising: no Nose bleeding: no Rectal bleeding: no Prolonged menstrual cycles: N/A Eating diet with consistent amounts of foods containing Vitamin K:yes Any recent antibiotic use? no Relevant past medical, surgical, family and social history reviewed and updated as indicated. Interim medical history since our last visit reviewed. Allergies and medications reviewed and updated.  HYPERTENSION Hypertension status: controlled  Satisfied with current treatment? no Duration of hypertension: years BP monitoring frequency:  not checking BP range:  BP medication side effects:  no Medication compliance: excellent compliance Previous BP meds:amlodipine Aspirin:  Coumadin Recurrent headaches: no Visual changes: no Palpitations: no Dyspnea: no Chest pain: no Lower extremity edema: no Dizzy/lightheaded: no   Review of Systems  Constitutional:  Negative for chills, diaphoresis, fatigue, fever and unexpected weight change.  Eyes:  Negative for visual disturbance.  Respiratory:  Negative for shortness of breath.   Cardiovascular:  Negative for chest pain and  leg swelling.  Neurological:  Negative for light-headedness and headaches.    Per HPI unless specifically indicated above     Objective:    There were no vitals taken for this visit.  Wt Readings from Last 3 Encounters:  08/26/23 163 lb 12.8 oz (74.3 kg)  07/22/23 164 lb 9.6 oz (74.7 kg)  06/21/23 163 lb 12.8 oz (74.3 kg)    Physical Exam Vitals and nursing note reviewed.  Constitutional:      General: He is not in acute distress.    Appearance: Normal appearance. He is not ill-appearing, toxic-appearing or diaphoretic.  HENT:     Head: Normocephalic.      Right Ear: External ear normal.     Left Ear: External ear normal.     Nose: No rhinorrhea.     Mouth/Throat:     Mouth: Mucous membranes are moist.  Eyes:     General:        Right eye: No discharge.        Left eye: No discharge.     Extraocular Movements: Extraocular movements intact.     Conjunctiva/sclera: Conjunctivae normal.     Pupils: Pupils are equal, round, and reactive to light.  Cardiovascular:     Rate and Rhythm: Normal rate and regular rhythm.     Heart sounds: No murmur heard. Pulmonary:     Effort: Pulmonary effort is normal. No respiratory distress.     Breath sounds: Normal breath sounds. No wheezing, rhonchi or rales.  Abdominal:     General: Abdomen is flat. Bowel sounds are normal.  Musculoskeletal:     Cervical back: Normal range of motion  and neck supple.  Skin:    General: Skin is warm and dry.     Capillary Refill: Capillary refill takes less than 2 seconds.  Neurological:     General: No focal deficit present.     Mental Status: He is alert and oriented to person, place, and time.  Psychiatric:        Mood and Affect: Mood normal.        Behavior: Behavior normal.        Thought Content: Thought content normal.        Judgment: Judgment normal.    Results for orders placed or performed in visit on 08/26/23  CoaguChek XS/INR Waived (STAT)  Result Value Ref Range   INR 2.3 (H)  0.9 - 1.1   Prothrombin Time 27.2 sec      Assessment & Plan:   Problem List Items Addressed This Visit       Cardiovascular and Mediastinum   Essential hypertension     Hematopoietic and Hemostatic   Antithrombin 3 deficiency (HCC) - Primary     Other   Hyperlipidemia   IDA (iron deficiency anemia)     Follow up plan: No follow-ups on file.

## 2023-09-29 ENCOUNTER — Ambulatory Visit (INDEPENDENT_AMBULATORY_CARE_PROVIDER_SITE_OTHER): Payer: 59 | Admitting: Nurse Practitioner

## 2023-09-29 ENCOUNTER — Encounter: Payer: Self-pay | Admitting: Nurse Practitioner

## 2023-09-29 VITALS — BP 106/63 | HR 65 | Temp 98.1°F | Wt 163.0 lb

## 2023-09-29 DIAGNOSIS — D6859 Other primary thrombophilia: Secondary | ICD-10-CM

## 2023-09-29 DIAGNOSIS — I10 Essential (primary) hypertension: Secondary | ICD-10-CM

## 2023-09-29 DIAGNOSIS — E785 Hyperlipidemia, unspecified: Secondary | ICD-10-CM

## 2023-09-29 DIAGNOSIS — D509 Iron deficiency anemia, unspecified: Secondary | ICD-10-CM

## 2023-09-29 DIAGNOSIS — R7303 Prediabetes: Secondary | ICD-10-CM

## 2023-09-29 LAB — COAGUCHEK XS/INR WAIVED
INR: 2.8 — ABNORMAL HIGH (ref 0.9–1.1)
Prothrombin Time: 34 s

## 2023-09-29 MED ORDER — WARFARIN SODIUM 5 MG PO TABS: 5.0000 mg | ORAL_TABLET | Freq: Every day | ORAL | 1 refills | Status: DC

## 2023-09-29 MED ORDER — AMLODIPINE BESYLATE 2.5 MG PO TABS: 2.5000 mg | ORAL_TABLET | Freq: Every day | ORAL | 1 refills | Status: DC

## 2023-09-29 MED ORDER — ATORVASTATIN CALCIUM 10 MG PO TABS: 10.0000 mg | ORAL_TABLET | Freq: Every day | ORAL | 1 refills | Status: DC

## 2023-09-29 NOTE — Assessment & Plan Note (Addendum)
Chronic, ongoing  Currently taking Coumadin 5 mg on MWFSun, Coumadin 2.5 mg TTSat  INR today is 2.8 which is in goal He denies bleeding concerns today  Continue current dosing  Reviewed return precaution Follow up INR in one month or sooner if concerns arise

## 2023-09-29 NOTE — Assessment & Plan Note (Signed)
Chronic.  Controlled.  Continue with current medication regimen of atorvastatin daily.  Labs ordered today.  Return to clinic in 6 months for reevaluation.  Call sooner if concerns arise.

## 2023-09-29 NOTE — Assessment & Plan Note (Signed)
Chronic.  Not well controlled.  Discussed importance of Iron supplement with patient during visit.  He is taking ferrous sulfate once a day.  Discussed with patient the importance of taking medication at least daily, ideally would take it BID.  Labs ordered at visit today.

## 2023-09-29 NOTE — Assessment & Plan Note (Signed)
Chronic.  Controlled.  Continue with current medication regimen of Amlodipine.  Labs ordered today.  Return to clinic in 3 months for reevaluation.  Call sooner if concerns arise.

## 2023-09-30 LAB — CBC WITH DIFFERENTIAL/PLATELET
Basophils Absolute: 0.1 10*3/uL (ref 0.0–0.2)
Basos: 1 %
EOS (ABSOLUTE): 0.3 10*3/uL (ref 0.0–0.4)
Eos: 3 %
Hematocrit: 38.2 % (ref 37.5–51.0)
Hemoglobin: 11.9 g/dL — ABNORMAL LOW (ref 13.0–17.7)
Immature Grans (Abs): 0 10*3/uL (ref 0.0–0.1)
Immature Granulocytes: 0 %
Lymphocytes Absolute: 2.2 10*3/uL (ref 0.7–3.1)
Lymphs: 26 %
MCH: 25.9 pg — ABNORMAL LOW (ref 26.6–33.0)
MCHC: 31.2 g/dL — ABNORMAL LOW (ref 31.5–35.7)
MCV: 83 fL (ref 79–97)
Monocytes Absolute: 0.6 10*3/uL (ref 0.1–0.9)
Monocytes: 7 %
Neutrophils Absolute: 5.4 10*3/uL (ref 1.4–7.0)
Neutrophils: 63 %
Platelets: 165 10*3/uL (ref 150–450)
RBC: 4.6 x10E6/uL (ref 4.14–5.80)
RDW: 14.4 % (ref 11.6–15.4)
WBC: 8.6 10*3/uL (ref 3.4–10.8)

## 2023-09-30 LAB — LIPID PANEL
Chol/HDL Ratio: 2.5 ratio (ref 0.0–5.0)
Cholesterol, Total: 113 mg/dL (ref 100–199)
HDL: 46 mg/dL (ref >39)
LDL Chol Calc (NIH): 55 mg/dL (ref 0–99)
Triglycerides: 52 mg/dL (ref 0–149)
VLDL Cholesterol Cal: 12 mg/dL (ref 5–40)

## 2023-09-30 LAB — COMPREHENSIVE METABOLIC PANEL
ALT: 6 [IU]/L (ref 0–44)
AST: 15 [IU]/L (ref 0–40)
Albumin: 4.3 g/dL (ref 3.8–4.8)
Alkaline Phosphatase: 59 [IU]/L (ref 44–121)
BUN/Creatinine Ratio: 12 (ref 10–24)
BUN: 12 mg/dL (ref 8–27)
Bilirubin Total: 0.4 mg/dL (ref 0.0–1.2)
CO2: 22 mmol/L (ref 20–29)
Calcium: 9 mg/dL (ref 8.6–10.2)
Chloride: 103 mmol/L (ref 96–106)
Creatinine, Ser: 1.01 mg/dL (ref 0.76–1.27)
Globulin, Total: 2.7 g/dL (ref 1.5–4.5)
Glucose: 87 mg/dL (ref 70–99)
Potassium: 4.2 mmol/L (ref 3.5–5.2)
Sodium: 138 mmol/L (ref 134–144)
Total Protein: 7 g/dL (ref 6.0–8.5)
eGFR: 77 mL/min/{1.73_m2} (ref >59)

## 2023-09-30 LAB — HEMOGLOBIN A1C
Est. average glucose Bld gHb Est-mCnc: 120 mg/dL
Hgb A1c MFr Bld: 5.8 % — ABNORMAL HIGH (ref 4.8–5.6)

## 2023-11-02 ENCOUNTER — Ambulatory Visit: Payer: 59 | Admitting: Nurse Practitioner

## 2023-11-02 ENCOUNTER — Encounter: Payer: Self-pay | Admitting: Nurse Practitioner

## 2023-11-02 VITALS — BP 131/65 | HR 55 | Temp 98.2°F | Ht 72.0 in | Wt 159.8 lb

## 2023-11-02 DIAGNOSIS — Z0000 Encounter for general adult medical examination without abnormal findings: Secondary | ICD-10-CM

## 2023-11-02 DIAGNOSIS — Z7901 Long term (current) use of anticoagulants: Secondary | ICD-10-CM

## 2023-11-02 DIAGNOSIS — Z7189 Other specified counseling: Secondary | ICD-10-CM | POA: Diagnosis not present

## 2023-11-02 DIAGNOSIS — D6859 Other primary thrombophilia: Secondary | ICD-10-CM | POA: Diagnosis not present

## 2023-11-02 LAB — COAGUCHEK XS/INR WAIVED
INR: 3 — ABNORMAL HIGH (ref 0.9–1.1)
Prothrombin Time: 35.9 s

## 2023-11-02 NOTE — Assessment & Plan Note (Signed)
A voluntary discussion about advance care planning including the explanation and discussion of advance directives was extensively discussed  with the patient for 5 minutes with patient.  Explanation about the health care proxy and Living will was reviewed and packet with forms with explanation of how to fill them out was given.  During this discussion, the patient was not able to identify a health care proxy.  And does not plan to fill out the paperwork required.  Patient was offered a separate Advance Care Planning visit for further assistance with forms.

## 2023-11-02 NOTE — Assessment & Plan Note (Signed)
Chronic,  3.0 today which is within goal.  Goal INR 2.0-3.0.  Continue -5 mg of Coumadin on Monday, Wednesday, Friday, and Sunday & 2.5 mg of Coumadin on Tuesday and Thursday and Saturday.  Follow-up INR in 1 month.  Decrease green leafy vegetables.  Will adjust dose if needed at that time.  Bleeding precautions discussed with patient at visit today.

## 2023-11-02 NOTE — Progress Notes (Signed)
BP 131/65 (BP Location: Left Arm, Patient Position: Sitting, Cuff Size: Large)   Pulse (!) 55   Temp 98.2 F (36.8 C) (Oral)   Ht 6' (1.829 m)   Wt 159 lb 12.8 oz (72.5 kg)   SpO2 96%   BMI 21.67 kg/m    Subjective:    Patient ID: Thomas [name], male    DOB: 08/23/1946, 77 y.o.   MRN: [id number]  HPI: Thomas [name] is a 77 y.o. male presenting on 11/02/2023 for comprehensive medical examination. Current medical complaints include:none  He currently lives with: Interim Problems from his last visit: no  COUMADIN CHECK INR Today: 3.0- hasn't changed his diet.   Present Coumadin dose: 2.5 mg of Coumadin on Monday, Wednesday, Friday & Sunday 5 mg of Coumadin on Tuesday, Thursday, Saturday Goal: 2.0-3.0 Excessive bruising: no Nose bleeding: no Rectal bleeding: no Prolonged menstrual cycles: N/A Eating diet with consistent amounts of foods containing Vitamin K:yes Any recent antibiotic use? no Relevant past medical, surgical, family and social history reviewed and updated as indicated. Interim medical history since our last visit reviewed. Allergies and medications reviewed and updated.   Denies HA, CP, SOB, dizziness, palpitations, visual changes, and lower extremity swelling.   Functional Status Survey: Is the patient deaf or have difficulty hearing?: No Does the patient have difficulty seeing, even when wearing glasses/contacts?: No Does the patient have difficulty concentrating, remembering, or making decisions?: No Does the patient have difficulty walking or climbing stairs?: No Does the patient have difficulty dressing or bathing?: No Does the patient have difficulty doing errands alone such as visiting a doctor's office or shopping?: No  FALL RISK:    08/26/2023    1:41 PM 07/22/2023    1:45 PM 06/21/2023    1:19 PM 05/19/2023    2:20 PM 04/14/2023    1:16 PM  Fall Risk   Falls in the past year? 0 0 0 0 0  Number falls in past yr: 0 0 0 0 0  Injury with Fall?  0 0 0 0 0  Risk for fall due to : No Fall Risks No Fall Risks No Fall Risks No Fall Risks No Fall Risks  Follow up Falls evaluation completed Falls evaluation completed Falls evaluation completed Falls evaluation completed Falls evaluation completed    Depression Screen    08/26/2023    1:42 PM 07/22/2023    1:45 PM 06/21/2023    1:19 PM 05/19/2023    2:20 PM 04/14/2023    1:17 PM  Depression screen PHQ 2/9  Decreased Interest 0 0 0 0 0  Down, Depressed, Hopeless 0 0 0 0 0  PHQ - 2 Score 0 0 0 0 0  Altered sleeping 0 0 0 0 0  Tired, decreased energy 0 0 0 0 0  Change in appetite 0 0 0 0 0  Feeling bad or failure about yourself  0 0 0 0 0  Trouble concentrating 0 0 0 0 0  Moving slowly or fidgety/restless 0 0 0 0 0  Suicidal thoughts 0 0 0 0 0  PHQ-9 Score 0 0 0 0 0  Difficult doing work/chores Not difficult at all Not difficult at all Not difficult at all      Advanced Directives Does not have an advanced care plan.  Does not plan to make one. Declines further information.  Past Medical History:  Past Medical History:  Diagnosis Date   Clotting disorder (HCC)    Hyperlipidemia  Hypertension    Sinus infection    Tinnitus     Surgical History:  Past Surgical History:  Procedure Laterality Date   CATARACT EXTRACTION Bilateral 1998, 2007   MOHS SURGERY Left 09/08/2018   Mohs surgery Ear Dr.  [facility], Dr. Campbell Lerner   TONSILLECTOMY  [phone]    Medications:  Current Outpatient Medications on File Prior to Visit  Medication Sig   amLODipine (NORVASC) 2.5 MG tablet Take 1 tablet (2.5 mg total) by mouth daily.   atorvastatin (LIPITOR) 10 MG tablet Take 1 tablet (10 mg total) by mouth daily.   diphenhydrAMINE-PE-APAP (EQ SEVERE ALLERGY & SINUS) 25-5-325 MG TABS Take 1 tablet by mouth as needed.   ferrous sulfate 325 (65 FE) MG EC tablet Take 325 mg by mouth 3 (three) times daily with meals.   Pseudoeph-Doxylamine-DM-APAP (DAYQUIL/NYQUIL COLD/FLU RELIEF PO) Take by  mouth as needed.   warfarin (COUMADIN) 5 MG tablet Take 1 tablet (5 mg total) by mouth daily.   No current facility-administered medications on file prior to visit.    Allergies:  Allergies  Allergen Reactions   Dexamethasone Other (See Comments)    Renal Insufficiency    Social History:  Social History   Socioeconomic History   Marital status: Single    Spouse name: Not on file   Number of children: 0   Years of education: Not on file   Highest education level: Not on file  Occupational History   Occupation: Retried  Tobacco Use   Smoking status: Former    Current packs/day: 0.00    Types: Cigarettes    Quit date: 2002    Years since quitting: 22.9   Smokeless tobacco: Never   Tobacco comments:    started smokng at age 70  Vaping Use   Vaping status: Never Used  Substance and Sexual Activity   Alcohol use: Not Currently    Alcohol/week: 0.0 standard drinks of alcohol    Comment: rare   Drug use: No   Sexual activity: Not Currently  Other Topics Concern   Not on file  Social History Narrative   Not on file   Social Determinants of Health   Financial Resource Strain: Low Risk  (01/31/2021)   Overall Financial Resource Strain (CARDIA)    Difficulty of Paying Living Expenses: Not hard at all  Food Insecurity: No Food Insecurity (01/31/2021)   Hunger Vital Sign    Worried About Running Out of Food in the Last Year: Never true    Ran Out of Food in the Last Year: Never true  Transportation Needs: No Transportation Needs (01/31/2021)   PRAPARE - Administrator, Civil Service (Medical): No    Lack of Transportation (Non-Medical): No  Physical Activity: Inactive (01/31/2021)   Exercise Vital Sign    Days of Exercise per Week: 0 days    Minutes of Exercise per Session: 0 min  Stress: No Stress Concern Present (01/31/2021)   [employer] of Occupational Health - Occupational Stress Questionnaire    Feeling of Stress : Not at all  Social Connections: Not  on file  Intimate Partner Violence: Not on file   Social History   Tobacco Use  Smoking Status Former   Current packs/day: 0.00   Types: Cigarettes   Quit date: 2002   Years since quitting: 22.9  Smokeless Tobacco Never  Tobacco Comments   started smokng at age 9   Social History   Substance and Sexual Activity  Alcohol  Use Not Currently   Alcohol/week: 0.0 standard drinks of alcohol   Comment: rare    Family History:  Family History  Problem Relation Age of Onset   Alzheimer's disease Mother    Heart attack Mother    Heart attack Father    Cancer Sister    Diabetes Sister    Diabetes Sister     Past medical history, surgical history, medications, allergies, family history and social history reviewed with patient today and changes made to appropriate areas of the chart.   Review of Systems  Eyes:  Negative for blurred vision and double vision.  Respiratory:  Negative for shortness of breath.   Cardiovascular:  Negative for chest pain, palpitations and leg swelling.  Neurological:  Negative for dizziness and headaches.   All other ROS negative except what is listed above and in the HPI.      Objective:    BP 131/65 (BP Location: Left Arm, Patient Position: Sitting, Cuff Size: Large)   Pulse (!) 55   Temp 98.2 F (36.8 C) (Oral)   Ht 6' (1.829 m)   Wt 159 lb 12.8 oz (72.5 kg)   SpO2 96%   BMI 21.67 kg/m   Wt Readings from Last 3 Encounters:  11/02/23 159 lb 12.8 oz (72.5 kg)  09/29/23 163 lb (73.9 kg)  08/26/23 163 lb 12.8 oz (74.3 kg)    No results found.  Physical Exam Vitals and nursing note reviewed.  Constitutional:      General: He is not in acute distress.    Appearance: Normal appearance. He is not ill-appearing, toxic-appearing or diaphoretic.  HENT:     Head: Normocephalic.     Right Ear: Tympanic membrane, ear canal and external ear normal.     Left Ear: Tympanic membrane, ear canal and external ear normal.     Nose: Nose normal. No  congestion or rhinorrhea.     Mouth/Throat:     Mouth: Mucous membranes are moist.  Eyes:     General:        Right eye: No discharge.        Left eye: No discharge.     Extraocular Movements: Extraocular movements intact.     Conjunctiva/sclera: Conjunctivae normal.     Pupils: Pupils are equal, round, and reactive to light.  Cardiovascular:     Rate and Rhythm: Normal rate and regular rhythm.     Heart sounds: No murmur heard. Pulmonary:     Effort: Pulmonary effort is normal. No respiratory distress.     Breath sounds: Normal breath sounds. No wheezing, rhonchi or rales.  Abdominal:     General: Abdomen is flat. Bowel sounds are normal. There is no distension.     Palpations: Abdomen is soft.     Tenderness: There is no abdominal tenderness. There is no guarding.  Musculoskeletal:     Cervical back: Normal range of motion and neck supple.  Skin:    General: Skin is warm and dry.     Capillary Refill: Capillary refill takes less than 2 seconds.  Neurological:     General: No focal deficit present.     Mental Status: He is alert and oriented to person, place, and time.     Cranial Nerves: No cranial nerve deficit.     Motor: No weakness.     Deep Tendon Reflexes: Reflexes normal.  Psychiatric:        Mood and Affect: Mood normal.  Behavior: Behavior normal.        Thought Content: Thought content normal.        Judgment: Judgment normal.        11/02/2023    2:18 PM 01/31/2021    1:50 PM  6CIT Screen  What Year? 0 points 0 points  What month? 0 points 0 points  What time? 0 points 0 points  Count back from 20 0 points 0 points  Months in reverse 2 points 2 points  Repeat phrase 4 points 4 points  Total Score 6 points 6 points    Cognitive Testing - 6-CIT  Correct? Score   What year is it? yes 0 Yes = 0    No = 4  What month is it? yes 0 Yes = 0    No = 3  Remember:     Floyde Parkins, [address], [place]     What time is it? yes 0 Yes = 0    No = 3   Count backwards from 20 to 1 yes 0 Correct = 0    1 error = 2   More than 1 error = 4  Say the months of the year in reverse. yes 0 Correct = 0    1 error = 2   More than 1 error = 4  What address did I ask you to remember? yes 2 Correct = 0  1 error = 2    2 error = 4    3 error = 6    4 error = 8    All wrong = 10       TOTAL SCORE  2/28   Interpretation:  Normal  Normal (0-7) Abnormal (8-28)    Results for orders placed or performed in visit on 09/29/23  Comp Met (CMET)  Result Value Ref Range   Glucose 87 70 - 99 mg/dL   BUN 12 8 - 27 mg/dL   Creatinine, Ser 1.61 0.76 - 1.27 mg/dL   eGFR 77 >09 UE/AVW/0.98   BUN/Creatinine Ratio 12 10 - 24   Sodium 138 134 - 144 mmol/L   Potassium 4.2 3.5 - 5.2 mmol/L   Chloride 103 96 - 106 mmol/L   CO2 22 20 - 29 mmol/L   Calcium 9.0 8.6 - 10.2 mg/dL   Total Protein 7.0 6.0 - 8.5 g/dL   Albumin 4.3 3.8 - 4.8 g/dL   Globulin, Total 2.7 1.5 - 4.5 g/dL   Bilirubin Total 0.4 0.0 - 1.2 mg/dL   Alkaline Phosphatase 59 44 - 121 IU/L   AST 15 0 - 40 IU/L   ALT 6 0 - 44 IU/L  CBC w/Diff  Result Value Ref Range   WBC 8.6 3.4 - 10.8 x10E3/uL   RBC 4.60 4.14 - 5.80 x10E6/uL   Hemoglobin 11.9 (L) 13.0 - 17.7 g/dL   Hematocrit 11.9 14.7 - 51.0 %   MCV 83 79 - 97 fL   MCH 25.9 (L) 26.6 - 33.0 pg   MCHC 31.2 (L) 31.5 - 35.7 g/dL   RDW 82.9 56.2 - 13.0 %   Platelets 165 150 - 450 x10E3/uL   Neutrophils 63 Not Estab. %   Lymphs 26 Not Estab. %   Monocytes 7 Not Estab. %   Eos 3 Not Estab. %   Basos 1 Not Estab. %   Neutrophils Absolute 5.4 1.4 - 7.0 x10E3/uL   Lymphocytes Absolute 2.2 0.7 - 3.1 x10E3/uL   Monocytes Absolute 0.6  0.1 - 0.9 x10E3/uL   EOS (ABSOLUTE) 0.3 0.0 - 0.4 x10E3/uL   Basophils Absolute 0.1 0.0 - 0.2 x10E3/uL   Immature Granulocytes 0 Not Estab. %   Immature Grans (Abs) 0.0 0.0 - 0.1 x10E3/uL  Lipid Profile  Result Value Ref Range   Cholesterol, Total 113 100 - 199 mg/dL   Triglycerides 52 0 - 149 mg/dL   HDL 46 >16  mg/dL   VLDL Cholesterol Cal 12 5 - 40 mg/dL   LDL Chol Calc (NIH) 55 0 - 99 mg/dL   Chol/HDL Ratio 2.5 0.0 - 5.0 ratio  HgB A1c  Result Value Ref Range   Hgb A1c MFr Bld 5.8 (H) 4.8 - 5.6 %   Est. average glucose Bld gHb Est-mCnc 120 mg/dL  CoaguChek XS/INR Waived (STAT)  Result Value Ref Range   INR 2.8 (H) 0.9 - 1.1   Prothrombin Time 34.0 sec      Assessment & Plan:   Problem List Items Addressed This Visit       Hematopoietic and Hemostatic   Antithrombin 3 deficiency (HCC)    Chronic,  3.0 today which is within goal.  Goal INR 2.0-3.0.  Continue -5 mg of Coumadin on Monday, Wednesday, Friday, and Sunday & 2.5 mg of Coumadin on Tuesday and Thursday and Saturday.  Follow-up INR in 1 month.  Decrease green leafy vegetables.  Will adjust dose if needed at that time.  Bleeding precautions discussed with patient at visit today.       Relevant Orders   CoaguChek XS/INR Waived (STAT)     Other   Encounter for current long-term use of anticoagulants    Chronic,  3.0 today which is within goal.  Goal INR 2.0-3.0.  Continue -5 mg of Coumadin on Monday, Wednesday, Friday, and Sunday & 2.5 mg of Coumadin on Tuesday and Thursday and Saturday.  Follow-up INR in 1 month.  Decrease green leafy vegetables.  Will adjust dose if needed at that time.  Bleeding precautions discussed with patient at visit today.       Advanced care planning/counseling discussion    A voluntary discussion about advance care planning including the explanation and discussion of advance directives was extensively discussed  with the patient for 5 minutes with patient.  Explanation about the health care proxy and Living will was reviewed and packet with forms with explanation of how to fill them out was given.  During this discussion, the patient was not able to identify a health care proxy.  And does not plan to fill out the paperwork required.  Patient was offered a separate Advance Care Planning visit for further  assistance with forms.         Other Visit Diagnoses     Encounter for annual wellness exam in Medicare patient    -  Primary        Preventative Services:  Health Risk Assessment and Personalized Prevention Plan: Up to date Bone Mass Measurements: Up to date CVD Screening: Up to date Colon Cancer Screening: NA Depression Screening: Up to date Diabetes Screening: Up to date Glaucoma Screening: declines Hepatitis B vaccine: NA Hepatitis C screening: Needs to get lab drawn HIV Screening: up to date Flu Vaccine: Declines Lung cancer Screening: NA Obesity Screening: Up to date Pneumonia Vaccines (2): Up to date STI Screening: NA PSA screening: Up todate  Discussed aspirin prophylaxis for myocardial infarction prevention and decision was it was not indicated  LABORATORY TESTING:  Health maintenance labs ordered today  as discussed above.   PSA was done already this year.   IMMUNIZATIONS:   - Tdap: Tetanus vaccination status reviewed: last tetanus booster within 10 years. - Influenza: Refused - Pneumovax: Up to date - Prevnar: Up to date - Zostavax vaccine: Refused  SCREENING: - Colonoscopy: Not applicable  Discussed with patient purpose of the colonoscopy is to detect colon cancer at curable precancerous or early stages   - AAA Screening: Not applicable  -Hearing Test: Not applicable  -Spirometry: Not applicable   PATIENT COUNSELING:    Sexuality: Discussed sexually transmitted diseases, partner selection, use of condoms, avoidance of unintended pregnancy  and contraceptive alternatives.   Advised to avoid cigarette smoking.  I discussed with the patient that most people either abstain from alcohol or drink within safe limits (<=14/week and <=4 drinks/occasion for males, <=7/weeks and <= 3 drinks/occasion for females) and that the risk for alcohol disorders and other health effects rises proportionally with the number of drinks per week and how often a drinker  exceeds daily limits.  Discussed cessation/primary prevention of drug use and availability of treatment for abuse.   Diet: Encouraged to adjust caloric intake to maintain  or achieve ideal body weight, to reduce intake of dietary saturated fat and total fat, to limit sodium intake by avoiding high sodium foods and not adding table salt, and to maintain adequate dietary potassium and calcium preferably from fresh fruits, vegetables, and low-fat dairy products.    stressed the importance of regular exercise  Injury prevention: Discussed safety belts, safety helmets, smoke detector, smoking near bedding or upholstery.   Dental health: Discussed importance of regular tooth brushing, flossing, and dental visits.   Follow up plan: NEXT PREVENTATIVE PHYSICAL DUE IN 1 YEAR. Return in about 1 month (around 12/03/2023) for Coag check.

## 2023-11-30 ENCOUNTER — Ambulatory Visit: Payer: Self-pay

## 2023-11-30 ENCOUNTER — Inpatient Hospital Stay
Admission: EM | Admit: 2023-11-30 | Discharge: 2023-12-09 | DRG: 330 | Disposition: A | Discharge status: Home / Self Care | Payer: 59 | Attending: Internal Medicine | Admitting: Internal Medicine

## 2023-11-30 ENCOUNTER — Emergency Department: Payer: 59

## 2023-11-30 ENCOUNTER — Encounter: Payer: Self-pay | Admitting: Emergency Medicine

## 2023-11-30 ENCOUNTER — Other Ambulatory Visit: Payer: Self-pay

## 2023-11-30 DIAGNOSIS — R112 Nausea with vomiting, unspecified: Secondary | ICD-10-CM

## 2023-11-30 DIAGNOSIS — Z87891 Personal history of nicotine dependence: Secondary | ICD-10-CM

## 2023-11-30 DIAGNOSIS — Z7901 Long term (current) use of anticoagulants: Secondary | ICD-10-CM | POA: Diagnosis not present

## 2023-11-30 DIAGNOSIS — Z9889 Other specified postprocedural states: Secondary | ICD-10-CM | POA: Diagnosis not present

## 2023-11-30 DIAGNOSIS — R5383 Other fatigue: Secondary | ICD-10-CM | POA: Diagnosis not present

## 2023-11-30 DIAGNOSIS — C184 Malignant neoplasm of transverse colon: Secondary | ICD-10-CM | POA: Diagnosis not present

## 2023-11-30 DIAGNOSIS — D72829 Elevated white blood cell count, unspecified: Secondary | ICD-10-CM | POA: Diagnosis not present

## 2023-11-30 DIAGNOSIS — Z833 Family history of diabetes mellitus: Secondary | ICD-10-CM

## 2023-11-30 DIAGNOSIS — Z79899 Other long term (current) drug therapy: Secondary | ICD-10-CM | POA: Diagnosis not present

## 2023-11-30 DIAGNOSIS — Z86718 Personal history of other venous thrombosis and embolism: Secondary | ICD-10-CM | POA: Diagnosis not present

## 2023-11-30 DIAGNOSIS — K6389 Other specified diseases of intestine: Secondary | ICD-10-CM

## 2023-11-30 DIAGNOSIS — D509 Iron deficiency anemia, unspecified: Secondary | ICD-10-CM | POA: Diagnosis not present

## 2023-11-30 DIAGNOSIS — Z820 Family history of epilepsy and other diseases of the nervous system: Secondary | ICD-10-CM | POA: Diagnosis not present

## 2023-11-30 DIAGNOSIS — K56601 Complete intestinal obstruction, unspecified as to cause: Principal | ICD-10-CM

## 2023-11-30 DIAGNOSIS — E279 Disorder of adrenal gland, unspecified: Secondary | ICD-10-CM | POA: Diagnosis present

## 2023-11-30 DIAGNOSIS — K56691 Other complete intestinal obstruction: Secondary | ICD-10-CM | POA: Diagnosis not present

## 2023-11-30 DIAGNOSIS — F32A Depression, unspecified: Secondary | ICD-10-CM | POA: Diagnosis present

## 2023-11-30 DIAGNOSIS — I10 Essential (primary) hypertension: Secondary | ICD-10-CM | POA: Diagnosis present

## 2023-11-30 DIAGNOSIS — K567 Ileus, unspecified: Secondary | ICD-10-CM | POA: Diagnosis not present

## 2023-11-30 DIAGNOSIS — Z85828 Personal history of other malignant neoplasm of skin: Secondary | ICD-10-CM | POA: Diagnosis not present

## 2023-11-30 DIAGNOSIS — C182 Malignant neoplasm of ascending colon: Secondary | ICD-10-CM | POA: Diagnosis not present

## 2023-11-30 DIAGNOSIS — Z888 Allergy status to other drugs, medicaments and biological substances status: Secondary | ICD-10-CM

## 2023-11-30 DIAGNOSIS — N400 Enlarged prostate without lower urinary tract symptoms: Secondary | ICD-10-CM | POA: Diagnosis present

## 2023-11-30 DIAGNOSIS — R140 Abdominal distension (gaseous): Secondary | ICD-10-CM | POA: Diagnosis not present

## 2023-11-30 DIAGNOSIS — Z8249 Family history of ischemic heart disease and other diseases of the circulatory system: Secondary | ICD-10-CM

## 2023-11-30 DIAGNOSIS — I700 Atherosclerosis of aorta: Secondary | ICD-10-CM | POA: Diagnosis not present

## 2023-11-30 DIAGNOSIS — K8020 Calculus of gallbladder without cholecystitis without obstruction: Secondary | ICD-10-CM | POA: Diagnosis present

## 2023-11-30 DIAGNOSIS — K56609 Unspecified intestinal obstruction, unspecified as to partial versus complete obstruction: Secondary | ICD-10-CM | POA: Diagnosis not present

## 2023-11-30 DIAGNOSIS — E876 Hypokalemia: Secondary | ICD-10-CM | POA: Diagnosis not present

## 2023-11-30 DIAGNOSIS — R1084 Generalized abdominal pain: Secondary | ICD-10-CM | POA: Diagnosis not present

## 2023-11-30 DIAGNOSIS — C786 Secondary malignant neoplasm of retroperitoneum and peritoneum: Secondary | ICD-10-CM | POA: Diagnosis not present

## 2023-11-30 DIAGNOSIS — Z86711 Personal history of pulmonary embolism: Secondary | ICD-10-CM | POA: Diagnosis not present

## 2023-11-30 DIAGNOSIS — Z608 Other problems related to social environment: Secondary | ICD-10-CM | POA: Diagnosis present

## 2023-11-30 DIAGNOSIS — N200 Calculus of kidney: Secondary | ICD-10-CM | POA: Diagnosis not present

## 2023-11-30 DIAGNOSIS — D6859 Other primary thrombophilia: Secondary | ICD-10-CM | POA: Diagnosis present

## 2023-11-30 DIAGNOSIS — K639 Disease of intestine, unspecified: Secondary | ICD-10-CM | POA: Diagnosis not present

## 2023-11-30 DIAGNOSIS — E785 Hyperlipidemia, unspecified: Secondary | ICD-10-CM | POA: Diagnosis not present

## 2023-11-30 DIAGNOSIS — R109 Unspecified abdominal pain: Secondary | ICD-10-CM | POA: Diagnosis present

## 2023-11-30 DIAGNOSIS — Z4682 Encounter for fitting and adjustment of non-vascular catheter: Secondary | ICD-10-CM | POA: Diagnosis not present

## 2023-11-30 LAB — CBC
HCT: 41.4 % (ref 39.0–52.0)
Hemoglobin: 13.7 g/dL (ref 13.0–17.0)
MCH: 27.5 pg (ref 26.0–34.0)
MCHC: 33.1 g/dL (ref 30.0–36.0)
MCV: 83 fL (ref 80.0–100.0)
Platelets: 268 10*3/uL (ref 150–400)
RBC: 4.99 MIL/uL (ref 4.22–5.81)
RDW: 15 % (ref 11.5–15.5)
WBC: 7 10*3/uL (ref 4.0–10.5)
nRBC: 0 % (ref 0.0–0.2)

## 2023-11-30 LAB — URINALYSIS, ROUTINE W REFLEX MICROSCOPIC
Bilirubin Urine: NEGATIVE
Glucose, UA: NEGATIVE mg/dL
Hgb urine dipstick: NEGATIVE
Ketones, ur: 5 mg/dL — AB
Leukocytes,Ua: NEGATIVE
Nitrite: NEGATIVE
Protein, ur: NEGATIVE mg/dL
Specific Gravity, Urine: >1.046 — ABNORMAL HIGH (ref 1.005–1.030)
pH: 5 (ref 5.0–8.0)

## 2023-11-30 LAB — COMPREHENSIVE METABOLIC PANEL
ALT: 14 U/L (ref 0–44)
AST: 29 U/L (ref 15–41)
Albumin: 3.7 g/dL (ref 3.5–5.0)
Alkaline Phosphatase: 50 U/L (ref 38–126)
Anion gap: 13 (ref 5–15)
BUN: 46 mg/dL — ABNORMAL HIGH (ref 8–23)
CO2: 28 mmol/L (ref 22–32)
Calcium: 9.1 mg/dL (ref 8.9–10.3)
Chloride: 93 mmol/L — ABNORMAL LOW (ref 98–111)
Creatinine, Ser: 1.13 mg/dL (ref 0.61–1.24)
GFR, Estimated: >60 mL/min (ref >60)
Glucose, Bld: 118 mg/dL — ABNORMAL HIGH (ref 70–99)
Potassium: 3.4 mmol/L — ABNORMAL LOW (ref 3.5–5.1)
Sodium: 134 mmol/L — ABNORMAL LOW (ref 135–145)
Total Bilirubin: 0.8 mg/dL (ref 0.0–1.2)
Total Protein: 7.3 g/dL (ref 6.5–8.1)

## 2023-11-30 LAB — LIPASE, BLOOD: Lipase: 73 U/L — ABNORMAL HIGH (ref 11–51)

## 2023-11-30 LAB — MAGNESIUM: Magnesium: 2.3 mg/dL (ref 1.7–2.4)

## 2023-11-30 MED ORDER — HEPARIN SODIUM (PORCINE) 5000 UNIT/ML IJ SOLN
5000.0000 [IU] | Freq: Three times a day (TID) | INTRAMUSCULAR | Status: AC
  Administered 2023-11-30: 5000 [IU] via SUBCUTANEOUS
  Filled 2023-11-30: qty 1

## 2023-11-30 MED ORDER — ONDANSETRON HCL 4 MG/2ML IJ SOLN
4.0000 mg | Freq: Once | INTRAMUSCULAR | Status: AC
  Administered 2023-11-30: 4 mg via INTRAVENOUS
  Filled 2023-11-30: qty 2

## 2023-11-30 MED ORDER — ONDANSETRON HCL 4 MG/2ML IJ SOLN: 4.0000 mg | Freq: Four times a day (QID) | INTRAMUSCULAR | Status: AC | PRN

## 2023-11-30 MED ORDER — HYDRALAZINE HCL 20 MG/ML IJ SOLN: 5.0000 mg | Freq: Four times a day (QID) | INTRAMUSCULAR | Status: DC | PRN

## 2023-11-30 MED ORDER — MORPHINE SULFATE (PF) 4 MG/ML IV SOLN
4.0000 mg | INTRAVENOUS | Status: AC | PRN
  Filled 2023-11-30: qty 1

## 2023-11-30 MED ORDER — LACTATED RINGERS IV BOLUS
1000.0000 mL | Freq: Once | INTRAVENOUS | Status: AC
  Administered 2023-11-30: 1000 mL via INTRAVENOUS

## 2023-11-30 MED ORDER — SODIUM CHLORIDE 0.9 % IV SOLN: 12.5000 mg | Freq: Three times a day (TID) | INTRAVENOUS | Status: AC | PRN

## 2023-11-30 MED ORDER — HYDROMORPHONE HCL 1 MG/ML IJ SOLN: 1.0000 mg | INTRAMUSCULAR | Status: DC | PRN

## 2023-11-30 MED ORDER — MORPHINE SULFATE (PF) 2 MG/ML IV SOLN: 2.0000 mg | INTRAVENOUS | Status: DC | PRN

## 2023-11-30 MED ORDER — ONDANSETRON HCL 4 MG PO TABS
4.0000 mg | ORAL_TABLET | Freq: Four times a day (QID) | ORAL | Status: DC | PRN
Start: 2023-11-30 — End: 2023-11-30

## 2023-11-30 MED ORDER — LACTATED RINGERS IV SOLN: INTRAVENOUS | Status: DC

## 2023-11-30 MED ORDER — MORPHINE SULFATE (PF) 4 MG/ML IV SOLN
4.0000 mg | Freq: Once | INTRAVENOUS | Status: AC
  Administered 2023-11-30: 4 mg via INTRAVENOUS
  Filled 2023-11-30: qty 1

## 2023-11-30 MED ORDER — ACETAMINOPHEN 10 MG/ML IV SOLN
1000.0000 mg | Freq: Four times a day (QID) | INTRAVENOUS | Status: AC | PRN
  Administered 2023-12-01: 1000 mg via INTRAVENOUS
  Filled 2023-11-30 (×2): qty 100

## 2023-11-30 MED ORDER — IOHEXOL 300 MG/ML  SOLN
100.0000 mL | Freq: Once | INTRAMUSCULAR | Status: AC | PRN
  Administered 2023-11-30: 100 mL via INTRAVENOUS

## 2023-11-30 NOTE — Assessment & Plan Note (Addendum)
-   Follow-up surgical pathology - Hematology/oncology consult pending pathology but will try to arrange prior to discharge

## 2023-11-30 NOTE — ED Provider Triage Note (Signed)
 Emergency Medicine Provider Triage Evaluation Note  MEIKO STRANAHAN , a 77 y.o. male  was evaluated in triage.  Pt complains of abdominal pain, distention, no difficulty with urination, unsure if he is constipated has not had a bowel movement in 2 days.  Review of Systems  Positive:  Negative:   Physical Exam  BP 122/80   Pulse 98   Temp 97.8 F (36.6 C) (Oral)   Resp 20   Ht 6' (1.829 m)   Wt 70.8 kg   SpO2 94%   BMI 21.16 kg/m  Gen:   Awake, no distress   Resp:  Normal effort  MSK:   Moves extremities without difficulty  Other:  Abdomen distended and hard, tender to palpation  Medical Decision Making  Medically screening exam initiated at 3:24 PM.  Appropriate orders placed.  [name] was informed that the remainder of the evaluation will be completed by another provider, this initial triage assessment does not replace that evaluation, and the importance of remaining in the ED until their evaluation is complete.  Had nursing staff insert IV, CT abdomen pelvis IV contrast ordered   Gasper Devere ORN, PA-C 11/30/23 1524

## 2023-11-30 NOTE — ED Triage Notes (Signed)
 Patient to ED via [facility] from home for abd pain x2 days. States he did vomit yesterday but not today. Has not had BM in 2 days. States he was able to keep down juice today but has not ate food.

## 2023-11-30 NOTE — Telephone Encounter (Signed)
Called and LVM notifying patient of providers advice.

## 2023-11-30 NOTE — Assessment & Plan Note (Addendum)
-   Secondary to large bowel obstruction - see treatment above

## 2023-11-30 NOTE — ED Provider Notes (Signed)
 [facility] Provider Note    Event Date/Time   First MD Initiated Contact with Patient 11/30/23 2143     (approximate)   History   Chief Complaint Abdominal Pain   HPI  Thomas [name] is a 77 y.o. male with past medical history of hypertension, hyperlipidemia, pulmonary embolism, and Antithrombin III deficiency on Coumadin  who presents to the ED complaining of abdominal pain.  Patient ports that he has had 3 days of increasing pain across his entire abdomen associated with distention, nausea, and vomiting.  He states that he has been able to keep down liquids recently but unable to keep down any solids.  He has not had a bowel movement in 2 days, does state that he continues to pass small amounts of gas.  He denies any fevers, flank pain, or dysuria.  He denies any history of similar symptoms, has never had a colonoscopy.      Physical Exam   Triage Vital Signs: ED Triage Vitals  Encounter Vitals Group     BP 11/30/23 1516 122/80     Systolic BP Percentile --      Diastolic BP Percentile --      Pulse Rate 11/30/23 1516 98     Resp 11/30/23 1516 20     Temp 11/30/23 1516 97.8 F (36.6 C)     Temp Source 11/30/23 1516 Oral     SpO2 11/30/23 1516 94 %     Weight 11/30/23 1517 156 lb (70.8 kg)     Height 11/30/23 1517 6' (1.829 m)     Head Circumference --      Peak Flow --      Pain Score 11/30/23 1517 6     Pain Loc --      Pain Education --      Exclude from Growth Chart --     Most recent vital signs: Vitals:   11/30/23 1516 11/30/23 2129  BP: 122/80 128/79  Pulse: 98 82  Resp: 20 17  Temp: 97.8 F (36.6 C) 97.9 F (36.6 C)  SpO2: 94% 93%    Constitutional: Alert and oriented. Eyes: Conjunctivae are normal. Head: Atraumatic. Nose: No congestion/rhinnorhea. Mouth/Throat: Mucous membranes are moist.  Cardiovascular: Normal rate, regular rhythm. Grossly normal heart sounds.  2+ radial pulses bilaterally. Respiratory: Normal  respiratory effort.  No retractions. Lungs CTAB. Gastrointestinal: Soft and diffusely tender to palpation with mild distention. Musculoskeletal: No lower extremity tenderness nor edema.  Neurologic:  Normal speech and language. No gross focal neurologic deficits are appreciated.    ED Results / Procedures / Treatments   Labs (all labs ordered are listed, but only abnormal results are displayed) Labs Reviewed  LIPASE, BLOOD - Abnormal; Notable for the following components:      Result Value   Lipase 73 (*)    All other components within normal limits  COMPREHENSIVE METABOLIC PANEL - Abnormal; Notable for the following components:   Sodium 134 (*)    Potassium 3.4 (*)    Chloride 93 (*)    Glucose, Bld 118 (*)    BUN 46 (*)    All other components within normal limits  CBC  URINALYSIS, ROUTINE W REFLEX MICROSCOPIC  PROTIME-INR  BASIC METABOLIC PANEL  CBC  MAGNESIUM  TYPE AND SCREEN  PREPARE FRESH FROZEN PLASMA   RADIOLOGY CT abdomen/pelvis reviewed and interpreted by me with high-grade bowel obstruction.  PROCEDURES:  Critical Care performed: No  Procedures   MEDICATIONS ORDERED IN ED:  Medications  lactated ringers  bolus 1,000 mL (has no administration in time range)  morphine  (PF) 4 MG/ML injection 4 mg (has no administration in time range)  ondansetron  (ZOFRAN ) injection 4 mg (has no administration in time range)  ondansetron  (ZOFRAN ) injection 4 mg (has no administration in time range)  acetaminophen  (OFIRMEV ) IV 1,000 mg (has no administration in time range)  lactated ringers  infusion (has no administration in time range)  morphine  (PF) 2 MG/ML injection 2 mg (has no administration in time range)  morphine  (PF) 4 MG/ML injection 4 mg (has no administration in time range)  HYDROmorphone  (DILAUDID ) injection 1 mg (has no administration in time range)  hydrALAZINE  (APRESOLINE ) injection 5 mg (has no administration in time range)  promethazine (PHENERGAN) 12.5 mg  in sodium chloride  0.9 % 50 mL IVPB (has no administration in time range)  iohexol  (OMNIPAQUE ) 300 MG/ML solution 100 mL (100 mLs Intravenous Contrast Given 11/30/23 1623)     IMPRESSION / MDM / ASSESSMENT AND PLAN / ED COURSE  I reviewed the triage vital signs and the nursing notes.                              77 y.o. male with past medical history of hypertension, hyperlipidemia, PE, and Antithrombin III deficiency on Coumadin  who presents to the ED complaining of 3 days of increasing abdominal pain, distention, nausea, and vomiting.  Patient's presentation is most consistent with acute presentation with potential threat to life or bodily function.  Differential diagnosis includes, but is not limited to, bowel obstruction, malignancy, diverticulitis, appendicitis, kidney stone, UTI, gastritis, anemia, electrolyte abnormality, AKI.  Patient nontoxic-appearing and in no acute distress, vital signs are unremarkable.  His abdomen is soft but diffusely tender to palpation and mildly distended.  Labs show no significant anemia, leukocytosis, electrolyte abnormality, or AKI.  LFTs are unremarkable, lipase mildly elevated.  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  Findings discussed with Dr. Cesar of general surgery, who does not plan on any emergent surgical intervention but recommends admission to the hospitalist service.  He request that we type and crossmatch 2 units of FFP in the event that patient would need surgery given he is on Coumadin  for clotting disorder.  We will treat symptomatically with IV morphine  and Zofran , case discussed with hospitalist for admission.      FINAL CLINICAL IMPRESSION(S) / ED DIAGNOSES   Final diagnoses:  Complete intestinal obstruction, unspecified cause (HCC)  Colonic mass     Rx / DC Orders   ED Discharge Orders     None        Note:  This document was  prepared using Dragon voice recognition software and may include unintentional dictation errors.   Willo Dunnings, MD 11/30/23 2249

## 2023-11-30 NOTE — Assessment & Plan Note (Addendum)
-   PRN meds for now

## 2023-11-30 NOTE — Telephone Encounter (Signed)
I agree patient should be seen in the ED.

## 2023-11-30 NOTE — H&P (Addendum)
 History and Physical   [name] FMW:[id number] DOB: 1946/05/13 DOA: 11/30/2023  PCP: Melvin Pao, NP  Outpatient Specialists: Dr. Teena Everts, [facility] dermatology Patient coming from: Home via EMS  I have personally briefly reviewed patient's old medical records in [facility] EMR.  Chief Concern: Abdominal pain, vomiting  HPI: 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  Per ED documentation, patient has not had a bowel movement in 2 days.  Vitals in the ED showed temperature 97.9, respiration rate 17, heart rate 82, blood pressure 128/79, SpO2 of 93% on room air.  Serum sodium is 134, potassium 3.4, chloride 93, bicarb 28, BUN 46, serum creatinine 1.13, eGFR greater than 60, nonfasting blood glucose 118, WBC 7, hemoglobin 13.7, platelets of 268.  CT abdomen pelvis w contrast: Apple core lesion within proximal transverse: Measuring 4.3 cm in length, consistent with colonic malignancy.  High-grade bowel obstruction as a result of transverse colon mass with marked distention of the small and proximal colon.  Cholelithiasis and gallbladder sludge.  Indeterminate bilateral adrenal masses, probably benign adenomas.  Tree-in-bud groundglass airspace disease throughout the visualized right lung.  ED treatment: Morphine  4 mg IV one-time dose, ondansetron  4 mg IV one-time dose, LR 1 L bolus.  EDP consulted general surgery who recommends NG tube placement and type and screen with preparation for fresh frozen plasma. -------------------------------- At bedside, patient able to tell me his name, age, location, current calendar year.  Patient reports that his belly pain started about 2 days ago.  He reports he vomited once yesterday after  having tried chicken and rice soup.  He denies vomiting today.  He states he is hungry and would like a large burger.  He denies trauma to his person, dysuria, hematuria, chest pain, blood in his stool.  He reports he has not had a bowel movement in 2 days.  Health maintenance: Patient has never had a colonoscopy.  Social history: He lives at home and his brother who is 55 years old and has Alzheimer lives with him.  He denies current tobacco use, quitting approximately 20 years ago.  He denies EtOH, last drink was 12 years ago.  He denies recreational drug use.  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  Patient does not have children.  ROS: Constitutional: no weight change, no fever ENT/Mouth: no sore throat, no rhinorrhea Eyes: no eye pain, no vision changes Cardiovascular: no chest pain, no dyspnea,  no edema, no palpitations Respiratory: no cough, no sputum, no wheezing Gastrointestinal: + nausea, + vomiting, no diarrhea, no constipation Genitourinary: no urinary incontinence, no dysuria, no hematuria Musculoskeletal: no arthralgias, no myalgias Skin: no skin lesions, no pruritus, Neuro: + weakness, no loss of consciousness, no syncope Psych: no anxiety, no depression, + decrease appetite Heme/Lymph: no bruising, no bleeding  ED Course: Discussed with the EDP, patient requiring hospitalization for chief concerns of high-grade bowel obstruction.  Assessment/Plan  Principal Problem:   Large bowel obstruction (HCC) Active Problems:   Antithrombin 3 deficiency (HCC)   Clinical depression   H/O deep venous thrombosis   Hyperlipidemia   Essential hypertension   Healed or old pulmonary embolism   History of basal cell carcinoma (BCC) excision   IDA (iron deficiency anemia)   Carcinoma  of transverse colon (HCC)   Nausea and vomiting   Assessment and Plan:  * Large bowel obstruction (HCC) Secondary to apple core lesion within the  proximal transverse colon N.p.o. NG tube placement per EDP Strict I's and O's Status post LR 1 L bolus LR 125 mL/ hour infusion ordered on admission, 1 day ordered Symptomatic support: Morphine  2 mg IV every 4 hours as needed for moderate pain, 20 hours ordered; morphine  4 mg IV every 4 hours as needed for severe pain, 20 hours of coverage ordered; Dilaudid  1 mg IV every 4 hours as needed for severe pain not responsive to IV morphine  4 mg, 12 hours of coverage ordered AM team to reevaluate patient at bedside for continued IV pain medication requirements Admit to telemetry surgical, inpatient  Nausea and vomiting Secondary to large bowel obstruction Symptomatic support: Ondansetron  4 mg IV every 6 hours as needed for nausea, vomiting, 5 days ordered; Phenergan 12.5 mg IV every 8 hours as needed for refractory nausea, vomiting, 1 day ordered LR 1 L bolus per EDP; on admission LR fusion at 125 mL/h, 1 day ordered  Carcinoma of transverse colon [facility]) General Surgery has been consulted Patient will need oncology referral and follow-up  Essential hypertension Hydralazine  5 mg IV every 6 hours as needed for SBP greater 165, 7 days ordered  Hyperlipidemia Home atorvastatin  10 mg daily not resumed on admission  Antithrombin 3 deficiency [facility]) Patient takes warfarin outpatient Type and cross with FFP preparation at the request of general surgery Type and screen and FFP preparation ordered by EDP  Chart reviewed.   DVT prophylaxis: Heparin  5000 units subcutaneous one-time dose ordered on admission; TED hose.  AM team to reinitiate pharmacologic DVT prophylaxis when the benefits outweigh the risk. Code Status: Full code Diet: N.p.o. Family Communication: A phone call was offered, patient declined stating that he already let his sister know.  And his brother has Alzheimer.  Patient states he does not have children.  Patient reports he has never been married. Disposition Plan: Pending clinical  course, guarded to poor prognosis Consults called: General Surgery via EDP Admission status: Telemetry surgical, inpatient  Past Medical History:  Diagnosis Date   Clotting disorder (HCC)    Hyperlipidemia    Hypertension    Sinus infection    Tinnitus    Past Surgical History:  Procedure Laterality Date   CATARACT EXTRACTION Bilateral 1998, 2007   MOHS SURGERY Left 09/08/2018   Mohs surgery Ear Dr.  Adine Rakers [facility], Dr. Jennine   TONSILLECTOMY  1956   Social History:  reports that he quit smoking about 23 years ago. His smoking use included cigarettes. He has never used smokeless tobacco. He reports that he does not currently use alcohol. He reports that he does not use drugs.  Allergies  Allergen Reactions   Dexamethasone Other (See Comments)    Renal Insufficiency   Family History  Problem Relation Age of Onset   Alzheimer's disease Mother    Heart attack Mother    Heart attack Father    Cancer Sister    Diabetes Sister    Diabetes Sister    Alzheimer's disease Brother    Family history: Family history reviewed and not pertinent.  Prior to Admission medications   Medication Sig Start Date End Date Taking? Authorizing Provider  amLODipine  (NORVASC ) 2.5 MG tablet Take 1 tablet (2.5 mg total) by mouth daily. 09/29/23   Melvin Pao, NP  atorvastatin  (LIPITOR) 10 MG tablet Take 1  tablet (10 mg total) by mouth daily. 09/29/23   Melvin Pao, NP  diphenhydrAMINE-PE-APAP [facility] SEVERE ALLERGY & SINUS) 25-5-325 MG TABS Take 1 tablet by mouth as needed.    [provider]  ferrous sulfate 325 (65 FE) MG EC tablet Take 325 mg by mouth 3 (three) times daily with meals.    [provider]  Pseudoeph-Doxylamine-DM-APAP (DAYQUIL/NYQUIL COLD/FLU RELIEF PO) Take by mouth as needed.    [provider]  warfarin (COUMADIN ) 5 MG tablet Take 1 tablet (5 mg total) by mouth daily. 09/29/23   Melvin Pao, NP   Physical Exam: Vitals:    11/30/23 1516 11/30/23 1517 11/30/23 2129  BP: 122/80  128/79  Pulse: 98  82  Resp: 20  17  Temp: 97.8 F (36.6 C)  97.9 F (36.6 C)  TempSrc: Oral  Oral  SpO2: 94%  93%  Weight:  70.8 kg   Height:  6' (1.829 m)    Constitutional: appears frail, NAD, calm Eyes: PERRL, lids and conjunctivae normal ENMT: Mucous membranes are moist. Posterior pharynx clear of any exudate or lesions. Age-appropriate dentition. Hearing appropriate Neck: normal, supple, no masses, no thyromegaly Respiratory: clear to auscultation bilaterally, no wheezing, no crackles. Normal respiratory effort. No accessory muscle use.  Cardiovascular: Regular rate and rhythm, no murmurs / rubs / gallops. No extremity edema. 2+ pedal pulses. No carotid bruits.  Abdomen: + Generalized tenderness and distended, no masses palpated, no hepatosplenomegaly.  No bowel sounds.  Musculoskeletal: no clubbing / cyanosis. No joint deformity upper and lower extremities. Good ROM, no contractures, no atrophy. Normal muscle tone.  Skin: no rashes, lesions, ulcers. No induration Neurologic: Sensation intact. Strength 5/5 in all 4.  Psychiatric: Normal judgment and insight. Alert and oriented x 3. Normal mood.   EKG: Not indicated at this time  Chest x-ray on Admission: Not indicated at this time  CT ABDOMEN PELVIS W CONTRAST Result Date: 11/30/2023 CLINICAL DATA:  Abdominal pain for 2 days, vomiting yesterday EXAM: CT ABDOMEN AND PELVIS WITH CONTRAST TECHNIQUE: Multidetector CT imaging of the abdomen and pelvis was performed using the standard protocol following bolus administration of intravenous contrast. RADIATION DOSE REDUCTION: This exam was performed according to the departmental dose-optimization program which includes automated exposure control, adjustment of the mA and/or kV according to patient size and/or use of iterative reconstruction technique. CONTRAST:  OMNIPAQUE  IOHEXOL  300 MG/ML  SOLN COMPARISON:  None Available.  FINDINGS: Lower chest: Diffuse tree in bud ground-glass airspace disease throughout the right middle and right lower lobe may reflect inflammation, infection, or sequela of aspiration given history of vomiting. Small hiatal hernia. Hepatobiliary: Cholelithiasis and gallbladder sludge without evidence of acute cholecystitis. The liver is unremarkable. No biliary duct dilation. Pancreas: Unremarkable. No pancreatic ductal dilatation or surrounding inflammatory changes. Spleen: Normal in size without focal abnormality. Adrenals/Urinary Tract: 4 mm nonobstructing calculus upper pole left kidney. 2 mm nonobstructing calculus lower pole right kidney. Bilateral simple renal cortical cysts do not require imaging follow-up. There are bilateral indeterminate adrenal nodules, measuring 1 cm on the right with attenuation of 115 HU, measuring 0.9 cm on the left with attenuation 98 HU. Bladder is unremarkable. Stomach/Bowel: There is an apple-core lesion within the proximal transverse colon measuring 4.3 cm in length consistent with colonic malignancy. There is functional obstruction as result of this mass, with marked distension of the ascending colon and small bowel. Numerous gas fluid levels are seen throughout the dilated small bowel and proximal colon. Normal appendix central upper  abdomen. No acute inflammatory changes. Vascular/Lymphatic: Aortic atherosclerosis. No enlarged abdominal or pelvic lymph nodes. Reproductive: Prostate is enlarged measuring 5.7 x 4.7 cm, with central calcified concretions. There are bilateral hydroceles, large on the right and small on the left, incompletely evaluated. Other: Small volume ascites within the lower pelvis. No free intraperitoneal gas. No abdominal wall hernia. Musculoskeletal: No acute or destructive bony abnormalities. Reconstructed images demonstrate no additional findings. IMPRESSION: 1. Apple-core lesion within the proximal transverse colon measuring 4.3 cm in length,  consistent with colonic malignancy. 2. High-grade bowel obstruction as result of the transverse colon mass, with marked distension of the small bowel and proximal colon. Surgical consultation recommended. 3. Cholelithiasis and gallbladder sludge. No evidence of acute cholecystitis. 4. Indeterminate bilateral adrenal masses, probably benign adenomas. Recommend follow-up adrenal washout CT in 1 year. If stable for = 1 year, no further follow-up imaging. JACR 2017 Aug; 14(8):1038-44, JCAT 2016 Mar-Apr; 40(2):194-200, Urol J 2006 Spring; 3(2):71-4. 5. Tree in bud ground-glass airspace disease throughout the visualized right lung, which may reflect aspiration, infection, or inflammation. 6. Small hiatal hernia. 7. Minimal pelvic ascites. 8. Bilateral hydroceles, right larger than left. 9. Enlarged prostate. 10.  Aortic Atherosclerosis (ICD10-I70.0). Electronically Signed   By: Ozell Daring M.D.   On: 11/30/2023 19:02   Labs on Admission: I have personally reviewed following labs  CBC: Recent Labs  Lab 11/30/23 1519  WBC 7.0  HGB 13.7  HCT 41.4  MCV 83.0  PLT 268   Basic Metabolic Panel: Recent Labs  Lab 11/30/23 1519  NA 134*  K 3.4*  CL 93*  CO2 28  GLUCOSE 118*  BUN 46*  CREATININE 1.13  CALCIUM  9.1   GFR: Estimated Creatinine Clearance: 54.8 mL/min (by C-G formula based on SCr of 1.13 mg/dL).  Liver Function Tests: Recent Labs  Lab 11/30/23 1519  AST 29  ALT 14  ALKPHOS 50  BILITOT 0.8  PROT 7.3  ALBUMIN 3.7   Recent Labs  Lab 11/30/23 1519  LIPASE 73*   This document was prepared using Dragon Voice Recognition software and may include unintentional dictation errors.  Dr. Sherre [facility]  If 7PM-7AM, please contact overnight-coverage provider If 7AM-7PM, please contact day attending provider [email or web address]  11/30/2023, 10:45 PM

## 2023-11-30 NOTE — Assessment & Plan Note (Addendum)
-   Secondary to apple core lesion within the proximal transverse colon -General Surgery following, appreciate assistance -S/p right hemicolectomy with primary anastomosis on 12/01/2023 - continue NPO; NG tube in place - continue WV - expected post-op ileus - continue pain/nausea control -Given slow return of bowel function, agree with PICC line and initiating TPN

## 2023-11-30 NOTE — Hospital Course (Addendum)
 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 He had not been eating well at home before multiple days due to worsening abdominal distention and pain.  He had been still taking Coumadin .  CT abdomen/pelvis showed apple core lesion within the proximal transverse colon measuring 4.3 cm in length concerning for colonic malignancy and high-grade bowel obstruction due to this. General surgery was consulted and patient was admitted for further workup.  INR was markedly elevated, 9.4 on admission and he underwent reversal with vitamin K  and FFP. He then underwent right hemicolectomy with primary anastomosis with general surgery on 12/01/2023.

## 2023-11-30 NOTE — Assessment & Plan Note (Addendum)
-   On Coumadin  outpatient - Patient has been compliant with Coumadin  but having extremely poor oral intake for several days likely accounting for supratherapeutic INR on admission.  No evidence of bleeding - Initial INR 9.4 with peak at 10.4; received 10 mg vitamin K  and 2 units FFP - s/p reversal and surgery on 1/1 - Hgb now stable post-op; heparin  has been resumed

## 2023-11-30 NOTE — Assessment & Plan Note (Addendum)
-   on lipitor at home; will resume as able

## 2023-11-30 NOTE — Telephone Encounter (Signed)
 Chief Complaint: Abdominal Pain  Symptoms: Vomiting, swollen abdomen, urine retention, abdomen pain 7/10 Frequency: constant  Pertinent Negatives: Patient denies blood in vomit or urine Disposition: [x] ED /[] Urgent Care (no appt availability in office) / [] Appointment(In office/virtual)/ []  [place] [facility]/ [] Home Care/ [] Refused Recommended Disposition /[] [place] [facility]/ []  Follow-up with PCP Additional Notes: Patient stated he started vomiting on Friday and had abdominal pain 7/10. Patient states the abdomen is swollen like he is pregnant and he was having trouble urinating but now he is going again, patient states he is vomiting food and fluids. Care advice was given and patient stated he will call EMT later and go. Advised patient to call now and offered to call patient declined and stated he would think about it.   Reason for Disposition  [1] SEVERE pain AND [2] age > 60 years  Answer Assessment - Initial Assessment Questions 1. LOCATION: Where does it hurt?      Swollen  2. RADIATION: Does the pain shoot anywhere else? (e.g., chest, back)     No  3. ONSET: When did the pain begin? (Minutes, hours or days ago)      Friday  4. SUDDEN: Gradual or sudden onset?     Sudden 5. PATTERN Does the pain come and go, or is it constant?    - If it comes and goes: How long does it last? Do you have pain now?     (Note: Comes and goes means the pain is intermittent. It goes away completely between bouts.)    - If constant: Is it getting better, staying the same, or getting worse?      (Note: Constant means the pain never goes away completely; most serious pain is constant and gets worse.)      Constant 6. SEVERITY: How bad is the pain?  (e.g., Scale 1-10; mild, moderate, or severe)    - MILD (1-3): Doesn't interfere with normal activities, abdomen soft and not tender to touch.     - MODERATE (4-7): Interferes with normal activities or awakens from sleep, abdomen  tender to touch.     - SEVERE (8-10): Excruciating pain, doubled over, unable to do any normal activities.       7/10 7. RECURRENT SYMPTOM: Have you ever had this type of stomach pain before? If Yes, ask: When was the last time? and What happened that time?      No  8. CAUSE: What do you think is causing the stomach pain?     I don't know  9. RELIEVING/AGGRAVATING FACTORS: What makes it better or worse? (e.g., antacids, bending or twisting motion, bowel movement)     Nothing  10. OTHER SYMPTOMS: Do you have any other symptoms? (e.g., back pain, diarrhea, fever, urination pain, vomiting)       Vomiting, Constipation, difficulty urinating, loss of appetite  Protocols used: Abdominal Pain - Male-A-AH

## 2023-12-01 ENCOUNTER — Inpatient Hospital Stay: Payer: 59

## 2023-12-01 ENCOUNTER — Encounter
Admission: EM | Disposition: A | Discharge status: Home / Self Care | Payer: Self-pay | Source: Home / Self Care | Attending: Internal Medicine

## 2023-12-01 ENCOUNTER — Other Ambulatory Visit: Payer: Self-pay

## 2023-12-01 ENCOUNTER — Encounter: Payer: Self-pay | Admitting: Internal Medicine

## 2023-12-01 ENCOUNTER — Inpatient Hospital Stay: Payer: 59 | Admitting: Registered Nurse

## 2023-12-01 DIAGNOSIS — K56609 Unspecified intestinal obstruction, unspecified as to partial versus complete obstruction: Secondary | ICD-10-CM | POA: Diagnosis not present

## 2023-12-01 DIAGNOSIS — R112 Nausea with vomiting, unspecified: Secondary | ICD-10-CM | POA: Diagnosis not present

## 2023-12-01 DIAGNOSIS — D6859 Other primary thrombophilia: Secondary | ICD-10-CM

## 2023-12-01 HISTORY — PX: COLECTOMY: SHX59

## 2023-12-01 LAB — CBC
HCT: 36.3 % — ABNORMAL LOW (ref 39.0–52.0)
HCT: 41 % (ref 39.0–52.0)
Hemoglobin: 11.8 g/dL — ABNORMAL LOW (ref 13.0–17.0)
Hemoglobin: 12.8 g/dL — ABNORMAL LOW (ref 13.0–17.0)
MCH: 27.4 pg (ref 26.0–34.0)
MCH: 27.4 pg (ref 26.0–34.0)
MCHC: 32.5 g/dL (ref 30.0–36.0)
MCHC: 31.2 g/dL (ref 30.0–36.0)
MCV: 84.4 fL (ref 80.0–100.0)
MCV: 87.8 fL (ref 80.0–100.0)
Platelets: 220 10*3/uL (ref 150–400)
Platelets: 219 10*3/uL (ref 150–400)
RBC: 4.3 MIL/uL (ref 4.22–5.81)
RBC: 4.67 MIL/uL (ref 4.22–5.81)
RDW: 15.1 % (ref 11.5–15.5)
RDW: 15 % (ref 11.5–15.5)
WBC: 5 10*3/uL (ref 4.0–10.5)
WBC: 6.5 10*3/uL (ref 4.0–10.5)
nRBC: 0 % (ref 0.0–0.2)
nRBC: 0 % (ref 0.0–0.2)

## 2023-12-01 LAB — BASIC METABOLIC PANEL
Anion gap: 12 (ref 5–15)
Anion gap: 13 (ref 5–15)
BUN: 42 mg/dL — ABNORMAL HIGH (ref 8–23)
BUN: 38 mg/dL — ABNORMAL HIGH (ref 8–23)
CO2: 26 mmol/L (ref 22–32)
CO2: 24 mmol/L (ref 22–32)
Calcium: 8.4 mg/dL — ABNORMAL LOW (ref 8.9–10.3)
Calcium: 7.8 mg/dL — ABNORMAL LOW (ref 8.9–10.3)
Chloride: 97 mmol/L — ABNORMAL LOW (ref 98–111)
Chloride: 100 mmol/L (ref 98–111)
Creatinine, Ser: 0.92 mg/dL (ref 0.61–1.24)
Creatinine, Ser: 0.94 mg/dL (ref 0.61–1.24)
GFR, Estimated: >60 mL/min (ref >60)
GFR, Estimated: >60 mL/min (ref >60)
Glucose, Bld: 99 mg/dL (ref 70–99)
Glucose, Bld: 111 mg/dL — ABNORMAL HIGH (ref 70–99)
Potassium: 3.3 mmol/L — ABNORMAL LOW (ref 3.5–5.1)
Potassium: 3.3 mmol/L — ABNORMAL LOW (ref 3.5–5.1)
Sodium: 135 mmol/L (ref 135–145)
Sodium: 137 mmol/L (ref 135–145)

## 2023-12-01 LAB — PROTIME-INR
INR: 9.4 (ref 0.8–1.2)
INR: 10.4 (ref 0.8–1.2)
INR: 5.2 (ref 0.8–1.2)
INR: 1.8 — ABNORMAL HIGH (ref 0.8–1.2)
INR: 1.5 — ABNORMAL HIGH (ref 0.8–1.2)
Prothrombin Time: 76.3 s — ABNORMAL HIGH (ref 11.4–15.2)
Prothrombin Time: 82.5 s — ABNORMAL HIGH (ref 11.4–15.2)
Prothrombin Time: 48.4 s — ABNORMAL HIGH (ref 11.4–15.2)
Prothrombin Time: 20.9 s — ABNORMAL HIGH (ref 11.4–15.2)
Prothrombin Time: 18.7 s — ABNORMAL HIGH (ref 11.4–15.2)

## 2023-12-01 LAB — TYPE AND SCREEN

## 2023-12-01 LAB — ABO/RH: ABO/RH(D): O POS

## 2023-12-01 SURGERY — COLECTOMY, TOTAL
Anesthesia: General | Site: Abdomen | Laterality: Right

## 2023-12-01 MED ORDER — KETAMINE HCL 50 MG/5ML IJ SOSY
PREFILLED_SYRINGE | INTRAMUSCULAR | Status: DC | PRN
  Administered 2023-12-01: 30 mg via INTRAVENOUS

## 2023-12-01 MED ORDER — SODIUM CHLORIDE (PF) 0.9 % IJ SOLN
INTRAMUSCULAR | Status: AC
  Filled 2023-12-01: qty 50

## 2023-12-01 MED ORDER — PHENYLEPHRINE 80 MCG/ML (10ML) SYRINGE FOR IV PUSH (FOR BLOOD PRESSURE SUPPORT)
PREFILLED_SYRINGE | INTRAVENOUS | Status: AC
  Filled 2023-12-01: qty 40

## 2023-12-01 MED ORDER — LACTATED RINGERS IV SOLN: INTRAVENOUS | Status: AC

## 2023-12-01 MED ORDER — PROPOFOL 10 MG/ML IV BOLUS
INTRAVENOUS | Status: AC
Start: 2023-12-01 — End: ?
  Filled 2023-12-01: qty 20

## 2023-12-01 MED ORDER — ROCURONIUM BROMIDE 100 MG/10ML IV SOLN
INTRAVENOUS | Status: DC | PRN
  Administered 2023-12-01: 70 mg via INTRAVENOUS
  Administered 2023-12-01: 10 mg via INTRAVENOUS

## 2023-12-01 MED ORDER — PROPOFOL 10 MG/ML IV BOLUS
INTRAVENOUS | Status: DC | PRN
  Administered 2023-12-01: 100 mg via INTRAVENOUS

## 2023-12-01 MED ORDER — FENTANYL CITRATE (PF) 100 MCG/2ML IJ SOLN
25.0000 ug | INTRAMUSCULAR | Status: DC | PRN
  Administered 2023-12-01: 25 ug via INTRAVENOUS
  Administered 2023-12-01: 50 ug via INTRAVENOUS
  Administered 2023-12-01: 25 ug via INTRAVENOUS

## 2023-12-01 MED ORDER — LIDOCAINE HCL (CARDIAC) PF 100 MG/5ML IV SOSY
PREFILLED_SYRINGE | INTRAVENOUS | Status: DC | PRN
  Administered 2023-12-01: 60 mg via INTRAVENOUS

## 2023-12-01 MED ORDER — VITAMIN K1 10 MG/ML IJ SOLN
10.0000 mg | Freq: Once | INTRAVENOUS | Status: AC
  Administered 2023-12-01: 10 mg via INTRAVENOUS
  Filled 2023-12-01: qty 1

## 2023-12-01 MED ORDER — SUGAMMADEX SODIUM 200 MG/2ML IV SOLN
INTRAVENOUS | Status: DC | PRN
  Administered 2023-12-01: 200 mg via INTRAVENOUS

## 2023-12-01 MED ORDER — SODIUM CHLORIDE (PF) 0.9 % IJ SOLN
INTRAMUSCULAR | Status: DC | PRN
  Administered 2023-12-01: 100 mL

## 2023-12-01 MED ORDER — KETAMINE HCL 50 MG/5ML IJ SOSY
PREFILLED_SYRINGE | INTRAMUSCULAR | Status: AC
  Filled 2023-12-01: qty 5

## 2023-12-01 MED ORDER — CEFAZOLIN SODIUM 1 G IJ SOLR
INTRAMUSCULAR | Status: AC
  Filled 2023-12-01: qty 20

## 2023-12-01 MED ORDER — BUPIVACAINE LIPOSOME 1.3 % IJ SUSP
INTRAMUSCULAR | Status: AC
  Filled 2023-12-01: qty 20

## 2023-12-01 MED ORDER — ONDANSETRON HCL 4 MG/2ML IJ SOLN
INTRAMUSCULAR | Status: AC
  Filled 2023-12-01: qty 2

## 2023-12-01 MED ORDER — FENTANYL CITRATE (PF) 100 MCG/2ML IJ SOLN
INTRAMUSCULAR | Status: AC
  Filled 2023-12-01: qty 2

## 2023-12-01 MED ORDER — BUPIVACAINE HCL (PF) 0.5 % IJ SOLN
INTRAMUSCULAR | Status: AC
  Filled 2023-12-01: qty 30

## 2023-12-01 MED ORDER — ACETAMINOPHEN 10 MG/ML IV SOLN
INTRAVENOUS | Status: DC | PRN
  Administered 2023-12-01: 1000 mg via INTRAVENOUS

## 2023-12-01 MED ORDER — ACETAMINOPHEN 10 MG/ML IV SOLN
INTRAVENOUS | Status: AC
  Filled 2023-12-01: qty 100

## 2023-12-01 MED ORDER — CHLORHEXIDINE GLUCONATE CLOTH 2 % EX PADS
6.0000 | MEDICATED_PAD | Freq: Every day | CUTANEOUS | Status: DC
  Administered 2023-12-02 – 2023-12-09 (×8): 6 via TOPICAL

## 2023-12-01 MED ORDER — LIDOCAINE HCL (PF) 2 % IJ SOLN
INTRAMUSCULAR | Status: AC
  Filled 2023-12-01: qty 5

## 2023-12-01 MED ORDER — ONDANSETRON HCL 4 MG/2ML IJ SOLN
INTRAMUSCULAR | Status: DC | PRN
  Administered 2023-12-01: 4 mg via INTRAVENOUS

## 2023-12-01 MED ORDER — 0.9 % SODIUM CHLORIDE (POUR BTL) OPTIME
TOPICAL | Status: DC | PRN
  Administered 2023-12-01: 1000 mL
  Administered 2023-12-01: 500 mL
  Administered 2023-12-01 (×2): 1000 mL

## 2023-12-01 MED ORDER — SODIUM CHLORIDE 0.9 % IV SOLN: 12.5000 mg | INTRAVENOUS | Status: DC | PRN

## 2023-12-01 MED ORDER — PHENYLEPHRINE 80 MCG/ML (10ML) SYRINGE FOR IV PUSH (FOR BLOOD PRESSURE SUPPORT)
PREFILLED_SYRINGE | INTRAVENOUS | Status: DC | PRN
  Administered 2023-12-01: 80 ug via INTRAVENOUS

## 2023-12-01 MED ORDER — IRRISEPT - 450ML BOTTLE WITH 0.05% CHG IN STERILE WATER, USP 99.95% OPTIME
TOPICAL | Status: DC | PRN
  Administered 2023-12-01: 450 mL

## 2023-12-01 MED ORDER — FENTANYL CITRATE (PF) 100 MCG/2ML IJ SOLN
INTRAMUSCULAR | Status: DC | PRN
  Administered 2023-12-01 (×3): 50 ug via INTRAVENOUS

## 2023-12-01 MED ORDER — PANTOPRAZOLE SODIUM 40 MG IV SOLR
40.0000 mg | Freq: Every day | INTRAVENOUS | Status: DC
  Administered 2023-12-01 – 2023-12-09 (×9): 40 mg via INTRAVENOUS
  Filled 2023-12-01 (×9): qty 10

## 2023-12-01 MED ORDER — SODIUM CHLORIDE 0.9% IV SOLUTION
Freq: Once | INTRAVENOUS | Status: AC
Start: 2023-12-01 — End: 2023-12-01
  Filled 2023-12-01: qty 250

## 2023-12-01 MED ORDER — MORPHINE SULFATE (PF) 4 MG/ML IV SOLN
4.0000 mg | INTRAVENOUS | Status: AC | PRN
  Administered 2023-12-01: 4 mg via INTRAVENOUS

## 2023-12-01 SURGICAL SUPPLY — 58 items
BLADE SURG 15 STRL LF DISP TIS (BLADE) ×1 IMPLANT
CANISTER WOUND CARE 500ML ATS (WOUND CARE) IMPLANT
CHLORAPREP W/TINT 26 (MISCELLANEOUS) ×1 IMPLANT
CLIP TI LARGE 6 (CLIP) IMPLANT
CLIP TI MEDIUM 6 (CLIP) IMPLANT
DRAIN PENROSE 5/8X18 LTX STRL (DRAIN) IMPLANT
DRAPE LAPAROTOMY 100X77 ABD (DRAPES) ×1 IMPLANT
DRAPE LEGGINS SURG 28X43 STRL (DRAPES) IMPLANT
DRAPE UNDER BUTTOCK W/FLU (DRAPES) IMPLANT
DRSG OPSITE POSTOP 4X10 (GAUZE/BANDAGES/DRESSINGS) IMPLANT
DRSG OPSITE POSTOP 4X8 (GAUZE/BANDAGES/DRESSINGS) IMPLANT
ELECT REM PT RETURN 9FT ADLT (ELECTROSURGICAL) ×1
ELECTRODE REM PT RTRN 9FT ADLT (ELECTROSURGICAL) ×1 IMPLANT
GAUZE 4X4 16PLY ~~LOC~~+RFID DBL (SPONGE) ×1 IMPLANT
GLOVE BIO SURGEON STRL SZ 6.5 (GLOVE) ×2 IMPLANT
GLOVE BIOGEL PI IND STRL 6.5 (GLOVE) ×2 IMPLANT
GOWN STRL REUS W/ TWL LRG LVL3 (GOWN DISPOSABLE) ×6 IMPLANT
HOLDER FOLEY CATH W/STRAP (MISCELLANEOUS) ×1 IMPLANT
KIT PREVENA INCISION MGT20CM45 (CANNISTER) IMPLANT
KIT TURNOVER KIT A (KITS) ×1 IMPLANT
LABEL OR SOLS (LABEL) ×1 IMPLANT
LIGASURE IMPACT 36 18CM CVD LR (INSTRUMENTS) ×1 IMPLANT
MANIFOLD NEPTUNE II (INSTRUMENTS) ×1 IMPLANT
NDL HYPO 22X1.5 SAFETY MO (MISCELLANEOUS) ×2 IMPLANT
NEEDLE HYPO 22X1.5 SAFETY MO (MISCELLANEOUS) ×2 IMPLANT
NS IRRIG 1000ML POUR BTL (IV SOLUTION) ×1 IMPLANT
PACK BASIN MAJOR ARMC (MISCELLANEOUS) ×1 IMPLANT
PACK COLON CLEAN CLOSURE (MISCELLANEOUS) ×1 IMPLANT
PAD PREP OB/GYN DISP 24X41 (PERSONAL CARE ITEMS) IMPLANT
RELOAD LINEAR CUT PROX 55 BLUE (ENDOMECHANICALS) IMPLANT
RELOAD PROXIMATE 75MM BLUE (ENDOMECHANICALS) ×3 IMPLANT
RELOAD STAPLE 55 3.8 BLU REG (ENDOMECHANICALS) IMPLANT
RELOAD STAPLE 75 3.8 BLU REG (ENDOMECHANICALS) IMPLANT
RELOAD STAPLER LINEAR PROX 30 (STAPLE) IMPLANT
RETRACTOR WND ALEXIS-O 25 LRG (MISCELLANEOUS) IMPLANT
RTRCTR WOUND ALEXIS O 25CM LRG (MISCELLANEOUS) ×1
SOL PREP PVP 2OZ (MISCELLANEOUS) ×1
SOLUTION PREP PVP 2OZ (MISCELLANEOUS) ×1 IMPLANT
SPONGE T-LAP 18X18 ~~LOC~~+RFID (SPONGE) ×3 IMPLANT
STAPLER GUN LINEAR PROX 60 (STAPLE) IMPLANT
STAPLER PROXIMATE 55 BLUE (STAPLE) IMPLANT
STAPLER PROXIMATE 75MM BLUE (STAPLE) IMPLANT
STAPLER RELOAD LINEAR PROX 30 (STAPLE)
STAPLER RELOADABLE 30 BLU REG (STAPLE) IMPLANT
STAPLER SKIN PROX 35W (STAPLE) ×1 IMPLANT
SURGILUBE 2OZ TUBE FLIPTOP (MISCELLANEOUS) ×1 IMPLANT
SUT ETHILON 4-0 FS2 18XMFL BLK (SUTURE)
SUT NYLON 2-0 (SUTURE) IMPLANT
SUT PDS AB 0 CT1 27 (SUTURE) IMPLANT
SUT SILK 3 0 SH 30 (SUTURE) IMPLANT
SUT SILK 3 0 SH CR/8 (SUTURE) ×1 IMPLANT
SUT STRATAFIX PDS 30 CT-1 (SUTURE) ×2 IMPLANT
SUT VIC AB 3-0 SH 27X BRD (SUTURE) ×2 IMPLANT
SUTURE ETHLN 4-0 FS2 18XMF BLK (SUTURE) IMPLANT
SYR 20ML LL LF (SYRINGE) ×2 IMPLANT
TRAP FLUID SMOKE EVACUATOR (MISCELLANEOUS) ×1 IMPLANT
TRAY FOLEY MTR SLVR 16FR STAT (SET/KITS/TRAYS/PACK) ×1 IMPLANT
WATER STERILE IRR 500ML POUR (IV SOLUTION) ×1 IMPLANT

## 2023-12-01 NOTE — Assessment & Plan Note (Signed)
-   On Coumadin at home

## 2023-12-01 NOTE — Anesthesia Procedure Notes (Signed)
 Procedure Name: Intubation Date/Time: 12/01/2023 2:30 PM  Performed by: Erie Chock, CRNAPre-anesthesia Checklist: Patient identified, Patient being monitored, Timeout performed, Emergency Drugs available and Suction available Patient Re-evaluated:Patient Re-evaluated prior to induction Oxygen Delivery Method: Circle system utilized Preoxygenation: Pre-oxygenation with 100% oxygen Induction Type: IV induction and Rapid sequence Laryngoscope Size: 4 and McGraph Grade View: Grade I Tube type: Oral Tube size: 7.5 mm Number of attempts: 1 Airway Equipment and Method: Stylet Placement Confirmation: ETT inserted through vocal cords under direct vision, positive ETCO2 and breath sounds checked- equal and bilateral Secured at: 22 cm Tube secured with: Tape Dental Injury: Teeth and Oropharynx as per pre-operative assessment

## 2023-12-01 NOTE — ED Notes (Signed)
 Pt was desaturating to 86% when RN returned with second unit of FFP. Repositioned to sitting at side of bed. Pt remained 86% and felt SOB. Placed on 3L. Currently 89%. MD aware.

## 2023-12-01 NOTE — ED Notes (Signed)
 Dr Frederick Peers at bedside, FFP to be given over 15-30 minutes.

## 2023-12-01 NOTE — Transfer of Care (Signed)
 Immediate Anesthesia Transfer of Care Note  Patient: Thomas [name]  Procedure(s) Performed: RIGHT COLECTOMY (Right: Abdomen)  Patient Location: PACU  Anesthesia Type:General  Level of Consciousness: awake, alert , and oriented  Airway & Oxygen Therapy: Patient Spontanous Breathing  Post-op Assessment: Report given to RN and Post -op Vital signs reviewed and stable  Post vital signs: Reviewed and stable  Last Vitals:  Vitals Value Taken Time  BP 148/73 12/01/23 1711  Temp    Pulse 68 12/01/23 1713  Resp 16 12/01/23 1713  SpO2 98 % 12/01/23 1713  Vitals shown include unfiled device data.  Last Pain:  Vitals:   12/01/23 1200  TempSrc:   PainSc: 5          Complications: No notable events documented.

## 2023-12-01 NOTE — Anesthesia Postprocedure Evaluation (Signed)
 Anesthesia Post Note  Patient: Thomas [name]  Procedure(s) Performed: RIGHT COLECTOMY (Right: Abdomen)  Patient location during evaluation: PACU Anesthesia Type: General Level of consciousness: awake and alert Pain management: pain level controlled Vital Signs Assessment: post-procedure vital signs reviewed and stable Respiratory status: spontaneous breathing, nonlabored ventilation, respiratory function stable and patient connected to nasal cannula oxygen Cardiovascular status: blood pressure returned to baseline and stable Postop Assessment: no apparent nausea or vomiting Anesthetic complications: no   No notable events documented.   Last Vitals:  Vitals:   12/01/23 1711 12/01/23 1715  BP: (!) 148/73 139/76  Pulse: 68 69  Resp: 16 15  Temp: 36.4 C   SpO2: 99% 94%    Last Pain:  Vitals:   12/01/23 1200  TempSrc:   PainSc: 5                  Prentice Murphy

## 2023-12-01 NOTE — Assessment & Plan Note (Signed)
-  resume home meds as able

## 2023-12-01 NOTE — Progress Notes (Signed)
       CROSS COVER NOTE  NAME: Thomas [name] MRN: [id number] DOB : 02/03/1946    Date of Service   12/01/2023   HPI/Events of Note   Nurse paged to inform this provider that patient's NG tube was placed successfully waiting on the KUB for confirmation.  During chart review it was noted that patient's INR resulted at 9.4.  No documentation found in notes to support possible causes or plans for treatment.   Chart review from previous encounters shows that patient's INR has been within therapeutic range while is on the warfarin.  No signs of active bleeding and the patient.  Hemoglobin stable.   Latest Reference Range & Units 11/30/23 23:18 12/01/23 04:10  Prothrombin Time 11.4 - 15.2 seconds 76.3 (H) 82.5 (H)  INR 0.8 - 1.2  9.4 (HH) 10.4 (HH)  (HH): Data is critically high (H): Data is abnormally high  Interventions   -INR repeated again to verify that initial results was not a lab error.  Repeat value was 10.4. -10 mg of vitamin K  was ordered to be administered IV. -Please follow in the a.m. patient is possibly a surgical candidate for his bowel obstruction. -FFP's already crossmatched at the lab if needed currently placed to transfuse order.     Saje Gallop Lamin Esperansa Sarabia, MSN, APRN, AGACNP-BC [facility] [place] Pager: [phone]. Check Amion for Availability

## 2023-12-01 NOTE — Progress Notes (Signed)
 Progress Note    Thomas [name]   FMW:[id number]  DOB: 1946/09/23  DOA: 11/30/2023     1 PCP: Melvin Pao, NP  Initial CC: abd pain, N/V  Hospital Course: 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 He had not been eating well at home before multiple days due to worsening abdominal distention and pain.  He had been still taking Coumadin .  CT abdomen/pelvis showed apple core lesion within the proximal transverse colon measuring 4.3 cm in length concerning for colonic malignancy and high-grade bowel obstruction due to this. General surgery was consulted and patient was admitted for further workup.  INR was markedly elevated, 9.4 on admission and he underwent reversal with vitamin K  and FFP.  Interval History:  More comfortable when seen in the ER this morning.  No further vomiting.  Still has ongoing abdominal discomfort and distention. Denies any bleeding.  Confirms he had not been eating well at home although was still taking Coumadin .  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           Barriers to discharge: none Disposition Plan:  Home HH orders placed: TBD Status is: Inpt  Objective: Blood pressure 132/77, pulse 77, temperature 98 F (36.7 C), temperature source Oral, resp. rate 18, height 6' (1.829 m), weight 70.8 kg, SpO2 100%.  Examination:  Physical Exam Constitutional:      General: He is not in acute distress.    Appearance: Normal appearance.  HENT:     Head: Normocephalic and atraumatic.     Mouth/Throat:     Mouth: Mucous membranes are moist.  Eyes:     Extraocular Movements: Extraocular movements intact.  Cardiovascular:     Rate and Rhythm: Normal rate and regular rhythm.  Pulmonary:     Effort: Pulmonary effort is normal. No respiratory distress.     Breath sounds: Normal breath sounds. No wheezing.  Abdominal:     General: There is distension.     Palpations: Abdomen is soft.     Tenderness: There is abdominal tenderness.  Musculoskeletal:  General: Normal range of motion.     Cervical back: Normal range of motion and neck supple.  Skin:    General: Skin is warm and dry.  Neurological:     General: No focal deficit present.     Mental Status: He is alert.  Psychiatric:        Mood and Affect: Mood normal.        Behavior: Behavior normal.      Consultants:  General surgery  Procedures:    Data Reviewed: Results for orders  placed or performed during the hospital encounter of 11/30/23 (from the past 24 hours)  Lipase, blood     Status: Abnormal   Collection Time: 11/30/23  3:19 PM  Result Value Ref Range   Lipase 73 (H) 11 - 51 U/L  Comprehensive metabolic panel     Status: Abnormal   Collection Time: 11/30/23  3:19 PM  Result Value Ref Range   Sodium 134 (L) 135 - 145 mmol/L   Potassium 3.4 (L) 3.5 - 5.1 mmol/L   Chloride 93 (L) 98 - 111 mmol/L   CO2 28 22 - 32 mmol/L   Glucose, Bld 118 (H) 70 - 99 mg/dL   BUN 46 (H) 8 - 23 mg/dL   Creatinine, Ser 8.86 0.61 - 1.24 mg/dL   Calcium  9.1 8.9 - 10.3 mg/dL   Total Protein 7.3 6.5 - 8.1 g/dL   Albumin 3.7 3.5 - 5.0 g/dL   AST 29 15 - 41 U/L   ALT 14 0 - 44 U/L   Alkaline Phosphatase 50 38 - 126 U/L   Total Bilirubin 0.8 0.0 - 1.2 mg/dL   GFR, Estimated >39 >39 mL/min   Anion gap 13 5 - 15  CBC     Status: None   Collection Time: 11/30/23  3:19 PM  Result Value Ref Range   WBC 7.0 4.0 - 10.5 K/uL   RBC 4.99 4.22 - 5.81 MIL/uL   Hemoglobin 13.7 13.0 - 17.0 g/dL   HCT 58.5 60.9 - 47.9 %   MCV 83.0 80.0 - 100.0 fL   MCH 27.5 26.0 - 34.0 pg   MCHC 33.1 30.0 - 36.0 g/dL   RDW 84.9 88.4 - 84.4 %   Platelets 268 150 - 400 K/uL   nRBC 0.0 0.0 - 0.2 %  Prepare fresh frozen plasma     Status: None (Preliminary result)   Collection Time: 11/30/23  9:58 PM  Result Value Ref Range   Unit Number [id number]    Blood Component Type THW PLS APHR    Unit division B0    Status of Unit ISSUED    Transfusion Status OK TO TRANSFUSE    Unit Number [id number]    Blood Component Type THW PLS APHR    Unit division 00    Status of Unit ISSUED    Transfusion Status      OK TO TRANSFUSE Performed at [facility], [address] [place], [place] [address]   Protime-INR     Status: Abnormal   Collection Time: 11/30/23 11:18 PM  Result Value Ref Range   Prothrombin Time 76.3 (H) 11.4 - 15.2 seconds   INR 9.4 (HH) 0.8 - 1.2  Type and screen [facility]     Status: None   Collection Time: 11/30/23 11:18 PM  Result Value Ref Range   ABO/RH(D) O POS    Antibody Screen NEG    Sample Expiration  12/03/2023,2359 Performed at [facility], [address] [place], [place] [address]   Magnesium     Status: None   Collection Time: 11/30/23 11:18 PM  Result Value Ref Range   Magnesium 2.3 1.7 - 2.4 mg/dL  Urinalysis, Routine w reflex microscopic -Urine, Clean Catch     Status: Abnormal   Collection Time: 11/30/23 11:21 PM  Result Value Ref Range   Color, Urine YELLOW (A) YELLOW   APPearance CLEAR (A) CLEAR   Specific Gravity, Urine >1.046 (H) 1.005 - 1.030   pH 5.0 5.0 - 8.0   Glucose, UA NEGATIVE NEGATIVE mg/dL   Hgb urine dipstick NEGATIVE NEGATIVE   Bilirubin Urine NEGATIVE NEGATIVE   Ketones, ur 5 (A) NEGATIVE mg/dL   Protein, ur NEGATIVE NEGATIVE mg/dL   Nitrite NEGATIVE NEGATIVE   Leukocytes,Ua NEGATIVE NEGATIVE  Protime-INR     Status: Abnormal   Collection Time: 12/01/23  4:10 AM  Result Value Ref Range   Prothrombin Time 82.5 (H) 11.4 - 15.2 seconds   INR 10.4 (HH) 0.8 - 1.2  Basic metabolic panel     Status: Abnormal   Collection Time: 12/01/23  4:59 AM  Result Value Ref Range   Sodium 135 135 - 145 mmol/L   Potassium 3.3 (L) 3.5 - 5.1 mmol/L   Chloride 97 (L) 98 - 111 mmol/L   CO2 26 22 - 32 mmol/L   Glucose, Bld 99 70 - 99 mg/dL   BUN 42 (H) 8 - 23 mg/dL   Creatinine, Ser 9.07 0.61 - 1.24 mg/dL   Calcium  8.4 (L) 8.9 - 10.3 mg/dL   GFR, Estimated >39 >39 mL/min   Anion gap 12 5 - 15  CBC     Status: Abnormal   Collection Time: 12/01/23  4:59 AM  Result Value Ref Range   WBC 5.0 4.0 - 10.5 K/uL   RBC 4.30 4.22 - 5.81 MIL/uL   Hemoglobin 11.8 (L) 13.0 - 17.0 g/dL   HCT 63.6 (L) 60.9 - 47.9 %   MCV 84.4 80.0 - 100.0 fL   MCH 27.4 26.0 - 34.0 pg   MCHC 32.5 30.0 - 36.0 g/dL   RDW 84.8 88.4 - 84.4 %   Platelets 220 150 - 400 K/uL   nRBC 0.0 0.0 - 0.2 %  ABO/Rh     Status:  None   Collection Time: 12/01/23  4:59 AM  Result Value Ref Range   ABO/RH(D)      O POS Performed at [facility], [address] [place], [place] [address]   Protime-INR     Status: Abnormal   Collection Time: 12/01/23  7:15 AM  Result Value Ref Range   Prothrombin Time 48.4 (H) 11.4 - 15.2 seconds   INR 5.2 (HH) 0.8 - 1.2  Prepare fresh frozen plasma     Status: None (Preliminary result)   Collection Time: 12/01/23  7:31 AM  Result Value Ref Range   Unit Number [id number]    Blood Component Type THW PLS APHR    Unit division B0    Status of Unit ALLOCATED    Transfusion Status OK TO TRANSFUSE    Unit Number [id number]    Blood Component Type THW PLS APHR    Unit division A0    Status of Unit ALLOCATED    Transfusion Status OK TO TRANSFUSE     I have reviewed pertinent nursing notes, vitals, labs, and images as necessary. I have ordered labwork to follow up on as  indicated.  I have reviewed the last notes from staff over past 24 hours. I have discussed patient's care plan and test results with nursing staff, CM/SW, and other staff as appropriate.  Time spent: Greater than 50% of the 55 minute visit was spent in counseling/coordination of care for the patient as laid out in the A&P.   LOS: 1 day   Alm Apo, MD [facility] 12/01/2023, 12:35 PM

## 2023-12-01 NOTE — ED Notes (Signed)
 Introduced self to pt. Surgeon was at bedside. Pt denies pain at this time. VSS.

## 2023-12-01 NOTE — Consult Note (Signed)
 SURGICAL CONSULTATION NOTE   HISTORY OF PRESENT ILLNESS (HPI):  78 y.o. male presented to [facility] ED for evaluation of abdominal distention. Patient reports he has been feeling abdominal distention for the last 2 to 3 days.  He endorses the distention with associated nausea.  No significant pain.  No pain radiation.  Distention is generalized.  No alleviating factor.  No aggravating factors.  Patient denies any previous history of abdominal surgery.  Patient denies any previous history of small bowel obstruction.  Patient denies any previous colonoscopy  In the ED he was found with stable vital signs.  On physical exam the abdomen is distended but nontender to palpation.  Labs showed a white blood cell count of 7.0 with hemoglobin of 13.7, platelets 268.  Patient had a mild hypokalemia of 3.4 but otherwise no significant electrolyte disturbance.  His INR initially was 9.4 and repeat INR is 10.4.  This was reported to overnight nurse practitioner and was treated with vitamin K .  Patient had a CT scan of the abdomen and pelvis that showed severe ascending large intestine dilation with small bowel dilation with an upper correlation of the proximal transverse as the point of obstruction.  There is gas and stool distal to the mass as well.  There is no free air or free fluid.  I personally evaluated the images.  Surgery is consulted by Dr. Willo in this context for evaluation and management of malignant neoplasm of the transverse colon with obstruction.  PAST MEDICAL HISTORY (PMH):  Past Medical History:  Diagnosis Date   Clotting disorder (HCC)    Hyperlipidemia    Hypertension    Sinus infection    Tinnitus      PAST SURGICAL HISTORY (PSH):  Past Surgical History:  Procedure Laterality Date   CATARACT EXTRACTION Bilateral 1998, 2007   MOHS SURGERY Left 09/08/2018   Mohs surgery Ear Dr.  Adine Rakers [facility], Dr. Jennine   TONSILLECTOMY  [phone]     MEDICATIONS:  Prior to Admission  medications   Medication Sig Start Date End Date Taking? Authorizing Provider  amLODipine  (NORVASC ) 2.5 MG tablet Take 1 tablet (2.5 mg total) by mouth daily. 09/29/23  Yes Melvin Pao, NP  atorvastatin  (LIPITOR) 10 MG tablet Take 1 tablet (10 mg total) by mouth daily. 09/29/23  Yes Melvin Pao, NP  diphenhydrAMINE-PE-APAP (EQ SEVERE ALLERGY & SINUS) 25-5-325 MG TABS Take 1 tablet by mouth as needed.   Yes [provider]  ferrous sulfate 325 (65 FE) MG EC tablet Take 325 mg by mouth daily with breakfast.   Yes [provider]  Pseudoeph-Doxylamine-DM-APAP (DAYQUIL/NYQUIL COLD/FLU RELIEF PO) Take by mouth as needed.   Yes [provider]  warfarin (COUMADIN ) 5 MG tablet Take 1 tablet (5 mg total) by mouth daily. 09/29/23  Yes Melvin Pao, NP     ALLERGIES:  Allergies  Allergen Reactions   Dexamethasone Other (See Comments)    Renal Insufficiency     SOCIAL HISTORY:  Social History   Socioeconomic History   Marital status: Single    Spouse name: Not on file   Number of children: 0   Years of education: Not on file   Highest education level: Not on file  Occupational History   Occupation: Retried  Tobacco Use   Smoking status: Former    Current packs/day: 0.00    Types: Cigarettes    Quit date: 2002    Years since quitting: 23.0   Smokeless tobacco: Never   Tobacco  comments:    started smokng at age 5  Vaping Use   Vaping status: Never Used  Substance and Sexual Activity   Alcohol use: Not Currently    Comment: no etoh in 12 years   Drug use: Never   Sexual activity: Not Currently  Other Topics Concern   Not on file  Social History Narrative   Not on file   Social Drivers of Health   Financial Resource Strain: Low Risk  (01/31/2021)   Overall Financial Resource Strain (CARDIA)    Difficulty of Paying Living Expenses: Not hard at all  Food Insecurity: No Food Insecurity (01/31/2021)   Hunger Vital Sign    Worried About  Running Out of Food in the Last Year: Never true    Ran Out of Food in the Last Year: Never true  Transportation Needs: No Transportation Needs (01/31/2021)   PRAPARE - Administrator, Civil Service (Medical): No    Lack of Transportation (Non-Medical): No  Physical Activity: Inactive (01/31/2021)   Exercise Vital Sign    Days of Exercise per Week: 0 days    Minutes of Exercise per Session: 0 min  Stress: No Stress Concern Present (01/31/2021)   [employer] of Occupational Health - Occupational Stress Questionnaire    Feeling of Stress : Not at all  Social Connections: Not on file  Intimate Partner Violence: Not on file      FAMILY HISTORY:  Family History  Problem Relation Age of Onset   Alzheimer's disease Mother    Heart attack Mother    Heart attack Father    Cancer Sister    Diabetes Sister    Diabetes Sister    Alzheimer's disease Brother      REVIEW OF SYSTEMS:  Constitutional: denies weight loss, fever, chills, or sweats  Eyes: denies any other vision changes, history of eye injury  ENT: denies sore throat, hearing problems  Respiratory: denies shortness of breath, wheezing  Cardiovascular: denies chest pain, palpitations  Gastrointestinal: Positive nausea and vomiting Genitourinary: denies burning with urination or urinary frequency Musculoskeletal: denies any other joint pains or cramps  Skin: denies any other rashes or skin discolorations  Neurological: denies any other headache, dizziness, weakness  Psychiatric: denies any other depression, anxiety   All other review of systems were negative   VITAL SIGNS:  Temp:  [97.6 F (36.4 C)-97.9 F (36.6 C)] 97.6 F (36.4 C) (01/01 0505) Pulse Rate:  [66-98] 66 (01/01 0600) Resp:  [15-20] 15 (01/01 0600) BP: (110-142)/(70-80) 110/70 (01/01 0600) SpO2:  [92 %-95 %] 93 % (01/01 0600) Weight:  [70.8 kg] 70.8 kg (12/31 1517)     Height: 6' (182.9 cm) Weight: 70.8 kg BMI (Calculated): 21.15    INTAKE/OUTPUT:  This shift: Total I/O In: 50 [IV Piggyback:50] Out: -   Last 2 shifts: @IOLAST2SHIFTS @   PHYSICAL EXAM:  Constitutional:  -- Normal body habitus  -- Awake, alert, and oriented x3  Eyes:  -- Pupils equally round and reactive to light  -- No scleral icterus  Ear, nose, and throat:  -- No jugular venous distension  Pulmonary:  -- No crackles  -- Equal breath sounds bilaterally -- Breathing non-labored at rest Cardiovascular:  -- S1, S2 present  -- No pericardial rubs Gastrointestinal:  -- Abdomen soft, nontender, but distended, no guarding or rebound tenderness -- No abdominal masses appreciated, pulsatile or otherwise  Musculoskeletal and Integumentary:  -- Wounds: None appreciated -- Extremities: B/L UE and LE FROM, hands and  feet warm, no edema  Neurologic:  -- Motor function: intact and symmetric -- Sensation: intact and symmetric   Labs:     Latest Ref Rng & Units 12/01/2023    4:59 AM 11/30/2023    3:19 PM 09/29/2023    1:48 PM  CBC  WBC 4.0 - 10.5 K/uL 5.0  7.0  8.6   Hemoglobin 13.0 - 17.0 g/dL 88.1  86.2  88.0   Hematocrit 39.0 - 52.0 % 36.3  41.4  38.2   Platelets 150 - 400 K/uL 220  268  165       Latest Ref Rng & Units 12/01/2023    4:59 AM 11/30/2023    3:19 PM 09/29/2023    1:48 PM  CMP  Glucose 70 - 99 mg/dL 99  881  87   BUN 8 - 23 mg/dL 42  46  12   Creatinine 0.61 - 1.24 mg/dL 9.07  8.86  8.98   Sodium 135 - 145 mmol/L 135  134  138   Potassium 3.5 - 5.1 mmol/L 3.3  3.4  4.2   Chloride 98 - 111 mmol/L 97  93  103   CO2 22 - 32 mmol/L 26  28  22    Calcium  8.9 - 10.3 mg/dL 8.4  9.1  9.0   Total Protein 6.5 - 8.1 g/dL  7.3  7.0   Total Bilirubin 0.0 - 1.2 mg/dL  0.8  0.4   Alkaline Phos 38 - 126 U/L  50  59   AST 15 - 41 U/L  29  15   ALT 0 - 44 U/L  14  6     Imaging studies:  EXAM: CT ABDOMEN AND PELVIS WITH CONTRAST   TECHNIQUE: Multidetector CT imaging of the abdomen and pelvis was performed using the standard  protocol following bolus administration of intravenous contrast.   RADIATION DOSE REDUCTION: This exam was performed according to the departmental dose-optimization program which includes automated exposure control, adjustment of the mA and/or kV according to patient size and/or use of iterative reconstruction technique.   CONTRAST:  OMNIPAQUE  IOHEXOL  300 MG/ML  SOLN   COMPARISON:  None Available.   FINDINGS: Lower chest: Diffuse tree in bud ground-glass airspace disease throughout the right middle and right lower lobe may reflect inflammation, infection, or sequela of aspiration given history of vomiting. Small hiatal hernia.   Hepatobiliary: Cholelithiasis and gallbladder sludge without evidence of acute cholecystitis. The liver is unremarkable. No biliary duct dilation.   Pancreas: Unremarkable. No pancreatic ductal dilatation or surrounding inflammatory changes.   Spleen: Normal in size without focal abnormality.   Adrenals/Urinary Tract: 4 mm nonobstructing calculus upper pole left kidney. 2 mm nonobstructing calculus lower pole right kidney. Bilateral simple renal cortical cysts do not require imaging follow-up.   There are bilateral indeterminate adrenal nodules, measuring 1 cm on the right with attenuation of 115 HU, measuring 0.9 cm on the left with attenuation 98 HU. Bladder is unremarkable.   Stomach/Bowel: There is an apple-core lesion within the proximal transverse colon measuring 4.3 cm in length consistent with colonic malignancy. There is functional obstruction as result of this mass, with marked distension of the ascending colon and small bowel. Numerous gas fluid levels are seen throughout the dilated small bowel and proximal colon. Normal appendix central upper abdomen. No acute inflammatory changes.   Vascular/Lymphatic: Aortic atherosclerosis. No enlarged abdominal or pelvic lymph nodes.   Reproductive: Prostate is enlarged measuring 5.7 x 4.7  cm, with central  calcified concretions. There are bilateral hydroceles, large on the right and small on the left, incompletely evaluated.   Other: Small volume ascites within the lower pelvis. No free intraperitoneal gas. No abdominal wall hernia.   Musculoskeletal: No acute or destructive bony abnormalities. Reconstructed images demonstrate no additional findings.   IMPRESSION: 1. Apple-core lesion within the proximal transverse colon measuring 4.3 cm in length, consistent with colonic malignancy. 2. High-grade bowel obstruction as result of the transverse colon mass, with marked distension of the small bowel and proximal colon. Surgical consultation recommended. 3. Cholelithiasis and gallbladder sludge. No evidence of acute cholecystitis. 4. Indeterminate bilateral adrenal masses, probably benign adenomas. Recommend follow-up adrenal washout CT in 1 year. If stable for = 1 year, no further follow-up imaging. JACR 2017 Aug; 14(8):1038-44, JCAT 2016 Mar-Apr; 40(2):194-200, Urol J 2006 Spring; 3(2):71-4. 5. Tree in bud ground-glass airspace disease throughout the visualized right lung, which may reflect aspiration, infection, or inflammation. 6. Small hiatal hernia. 7. Minimal pelvic ascites. 8. Bilateral hydroceles, right larger than left. 9. Enlarged prostate. 10.  Aortic Atherosclerosis (ICD10-I70.0).     Electronically Signed   By: Ozell Daring M.D.   On: 11/30/2023 19:02  Assessment/Plan:  78 y.o. male with apple core lesion of the transverse colon causing obstruction of the large intestine, complicated by pertinent comorbidities including Antithrombin III deficiency with INR of 10.4  Neoplasm of large intestine with obstruction -Most likely due to malignant neoplasm with obstruction -Significant dilation of the cecum up to 11 cm -Patient needs urgent surgical intervention to avoid the risk of perforation. -Physical exam right now with distended abdomen but nontender  to palpation, no acute abdomen.  Surgery will be needed in an urgent matter but should wait until his INR is optimized.  Antithrombin III deficiency -Patient on chronic warfarin.  As per chart review he has been stable between 2.5-3 of INR -Today INR is 10.4.  Patient does not recall having any changes on his medications -Discussed case with hospitalist.  Vitamin K  already administered.  Will also treated with FFP -Once INR is optimized, again patient will need surgical intervention Will continue to follow closely  Patient has very poor social support.  As per patient close his family's sister that he is in rehab and is unable to help.  Patiently by himself.  Lucas Petrin, MD

## 2023-12-01 NOTE — Anesthesia Preprocedure Evaluation (Signed)
 Anesthesia Evaluation  Patient identified by MRN, date of birth, ID band Patient awake    Reviewed: Allergy & Precautions, H&P , NPO status , Patient's Chart, lab work & pertinent test results, reviewed documented beta blocker date and time   History of Anesthesia Complications Negative for: history of anesthetic complications  Airway Mallampati: II  TM Distance: >3 FB Neck ROM: full    Dental  (+) Dental Advidsory Given, Poor Dentition, Missing   Pulmonary shortness of breath, asthma , neg sleep apnea, neg COPD, neg recent URI, Not current smoker, former smoker   Pulmonary exam normal breath sounds clear to auscultation       Cardiovascular Exercise Tolerance: Good hypertension, (-) angina (-) Past MI and (-) Cardiac Stents Normal cardiovascular exam(-) dysrhythmias (-) Valvular Problems/Murmurs Rhythm:regular Rate:Normal     Neuro/Psych  PSYCHIATRIC DISORDERS  Depression    negative neurological ROS     GI/Hepatic negative GI ROS, Neg liver ROS,,,  Endo/Other  negative endocrine ROS    Renal/GU negative Renal ROS  negative genitourinary   Musculoskeletal   Abdominal   Peds  Hematology Antithrombin III deficiency   Anesthesia Other Findings Past Medical History: No date: Clotting disorder (HCC) No date: Hyperlipidemia No date: Hypertension No date: Sinus infection No date: Tinnitus   Reproductive/Obstetrics negative OB ROS                              Anesthesia Physical Anesthesia Plan  ASA: 2  Anesthesia Plan: General   Post-op Pain Management:    Induction: Intravenous  PONV Risk Score and Plan: 2 and Ondansetron , Dexamethasone and Treatment may vary due to age or medical condition  Airway Management Planned: Oral ETT  Additional Equipment:   Intra-op Plan:   Post-operative Plan: Extubation in OR  Informed Consent: I have reviewed the patients History and Physical,  chart, labs and discussed the procedure including the risks, benefits and alternatives for the proposed anesthesia with the patient or authorized representative who has indicated his/her understanding and acceptance.     Dental Advisory Given  Plan Discussed with: Anesthesiologist, CRNA and Surgeon  Anesthesia Plan Comments:          Anesthesia Quick Evaluation

## 2023-12-01 NOTE — Op Note (Signed)
 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   Anesthesia: GETA  Surgeon: Dr. Rodolph, MD  Wound Classification: Contaminated  Indications: Patient is a 78 y.o. male with symptoms of obstruction was found to have carcinoma involving the transverse colon.  Oriented resection was indicated due to impending perforation of the cecum.  Findings: 1. Palpable mass at the proximal transverse 2. Tension free anastomosis 3. Adequate gross circulation on anastomosis 4. No gross metastatic disease identified 5. Adequate hemostasis    Description of procedure:  The patient was placed in the supine position and general endotracheal anesthesia was induced. Sequential pneumatic compression devices were placed on lower extremities. Preoperative antibiotics were given. A Foley catheter and nasogastric tube were placed. The abdomen was prepped and draped in the usual sterile fashion. A time-out was completed verifying correct patient, procedure, site, positioning, and implant(s) and/or special equipment prior to beginning this procedure.  A vertical midline incision was made from xiphoid to just above the pubis. This was deepened through the subcutaneous tissues and hemostasis was achieved with electrocautery. The linea alba was identified and incised and the peritoneal cavity entered.  The abdomen was explored. A mass was palpated in the transverse colon with severe dilation of the cecum and ascending colon.  Moderate amount of stool spillage was identified when trying to decompress the cecum.  The liver, omentum, and peritoneum, were inspected for the evidence of metastatic disease. No metastatic disease was noted.  The small bowel was inspected and retracted to the left using a moist gauze and self-retaining retractor.  Very difficult and  time-consuming mobilization of the ascending colon was done due to the severity of the dilation of the ascending and small intestine.  Using electrocautery, the colon was freed from its peritoneal attachments along the avascular line of Toldt from the cecum to the hepatic flexure. Additional lateral peritoneal coverings were incised to further mobilize the colon. The dissection was extended across the ileocolic junction and terminal ileum was mobilized. Both ureters were identified and protected, as were the duodenum, right kidney, and gonadal vessels. The hepatic flexure was carefully mobilized by dividing the peritoneum in the hepatorenal fossa.  Points of transection were selected proximally and distally.  The distal transection was distal to the middle colic artery.  The bowel was divided with the linear cutting stapler. The peritoneum overlying the mesentery was then scored with electrocautery and the mesentery and its vascular supply was ligated and divided with LigaSure device. The specimen was removed. Hemostasis was checked in the operative field. The two ends of bowel were checked and found to be viable, with excellent blood supply. The proximal and distal segments were brought into apposition and found to lie comfortably next to each other. Two stay sutures of 3-0 Vicryl were placed to approximate the antimesenteric borders of the bowel segments. Enterotomies were made on the antimesenteric corner of the staple line on the ileum and transverse colon and the linear cutting stapler inserted and fired. Hemostasis was checked on the staple line. The enterotomies were then closed with a linear stapler. The anastomosis was checked and found to be intact and widely patent. Mesenteric defect was closed with interrupted 3-0 Vicryl. The abdominal cavity was then copiously irrigated and hemostasis was checked.  The fascia was closed with a running suture of PDS 0.  Skin approximated with staples.  Negative  pressure dressing was personally applied  in layers.  Area of coverage of the negative pressure was 20 x 2 cm for a total of 40 cm.   The patient tolerated the procedure well and was taken to the postanesthesia care unit in stable condition.   Specimen: Right colon  Complications: None  Estimated Blood Loss: 100 mL

## 2023-12-01 NOTE — Progress Notes (Signed)
 Patient awake, alert to name, surgery completed. Indwelling foley catheter in place, continues to want to get up and urinate. Re-oriented that she has a indwelling foley catheter. Abd dressing with prevena c/d/I . Medicated for discomfort as ordered. Upon transfer back to room denies pain.  NPO , NGT to LIS, report given to accepting RN

## 2023-12-02 ENCOUNTER — Ambulatory Visit: Payer: 59 | Admitting: Nurse Practitioner

## 2023-12-02 ENCOUNTER — Encounter: Payer: Self-pay | Admitting: General Surgery

## 2023-12-02 DIAGNOSIS — D6859 Other primary thrombophilia: Secondary | ICD-10-CM | POA: Diagnosis not present

## 2023-12-02 DIAGNOSIS — K56609 Unspecified intestinal obstruction, unspecified as to partial versus complete obstruction: Secondary | ICD-10-CM | POA: Diagnosis not present

## 2023-12-02 LAB — PREPARE FRESH FROZEN PLASMA: Unit division: 0

## 2023-12-02 LAB — CBC WITH DIFFERENTIAL/PLATELET
Abs Immature Granulocytes: 0.05 10*3/uL (ref 0.00–0.07)
Basophils Absolute: 0 10*3/uL (ref 0.0–0.1)
Basophils Relative: 0 %
Eosinophils Absolute: 0 10*3/uL (ref 0.0–0.5)
Eosinophils Relative: 0 %
HCT: 40 % (ref 39.0–52.0)
Hemoglobin: 12.7 g/dL — ABNORMAL LOW (ref 13.0–17.0)
Immature Granulocytes: 1 %
Lymphocytes Relative: 10 %
Lymphs Abs: 1 10*3/uL (ref 0.7–4.0)
MCH: 26.9 pg (ref 26.0–34.0)
MCHC: 31.8 g/dL (ref 30.0–36.0)
MCV: 84.7 fL (ref 80.0–100.0)
Monocytes Absolute: 0.4 10*3/uL (ref 0.1–1.0)
Monocytes Relative: 4 %
Neutro Abs: 8.2 10*3/uL — ABNORMAL HIGH (ref 1.7–7.7)
Neutrophils Relative %: 85 %
Platelets: 187 10*3/uL (ref 150–400)
RBC: 4.72 MIL/uL (ref 4.22–5.81)
RDW: 14.8 % (ref 11.5–15.5)
Smear Review: NORMAL
WBC: 9.6 10*3/uL (ref 4.0–10.5)
nRBC: 0 % (ref 0.0–0.2)

## 2023-12-02 LAB — BPAM FFP
Blood Product Expiration Date: 202501062359
Blood Product Expiration Date: 202501062359
ISSUE DATE / TIME: 202501010828
ISSUE DATE / TIME: 202501010920
Unit Type and Rh: 5100
Unit Type and Rh: 5100

## 2023-12-02 LAB — BASIC METABOLIC PANEL
Anion gap: 13 (ref 5–15)
BUN: 29 mg/dL — ABNORMAL HIGH (ref 8–23)
CO2: 22 mmol/L (ref 22–32)
Calcium: 7.8 mg/dL — ABNORMAL LOW (ref 8.9–10.3)
Chloride: 102 mmol/L (ref 98–111)
Creatinine, Ser: 0.81 mg/dL (ref 0.61–1.24)
GFR, Estimated: >60 mL/min (ref >60)
Glucose, Bld: 94 mg/dL (ref 70–99)
Potassium: 3.4 mmol/L — ABNORMAL LOW (ref 3.5–5.1)
Sodium: 137 mmol/L (ref 135–145)

## 2023-12-02 LAB — MAGNESIUM: Magnesium: 2.2 mg/dL (ref 1.7–2.4)

## 2023-12-02 MED ORDER — HEPARIN (PORCINE) 25000 UT/250ML-% IV SOLN
2050.0000 [IU]/h | INTRAVENOUS | Status: DC
  Administered 2023-12-02: 1000 [IU]/h via INTRAVENOUS
  Administered 2023-12-03: 1250 [IU]/h via INTRAVENOUS
  Administered 2023-12-04 – 2023-12-06 (×3): 1550 [IU]/h via INTRAVENOUS
  Administered 2023-12-08: 1900 [IU]/h via INTRAVENOUS
  Administered 2023-12-09: 2050 [IU]/h via INTRAVENOUS
  Filled 2023-12-02 (×11): qty 250

## 2023-12-02 MED ORDER — POTASSIUM CHLORIDE 10 MEQ/100ML IV SOLN
10.0000 meq | INTRAVENOUS | Status: AC
  Administered 2023-12-02 (×4): 10 meq via INTRAVENOUS
  Filled 2023-12-02 (×3): qty 100

## 2023-12-02 MED ORDER — HEPARIN BOLUS VIA INFUSION
4000.0000 [IU] | Freq: Once | INTRAVENOUS | Status: AC
  Administered 2023-12-02: 4000 [IU] via INTRAVENOUS
  Filled 2023-12-02: qty 4000

## 2023-12-02 NOTE — Plan of Care (Signed)
  Problem: Education: Goal: Knowledge of General Education information will improve Description: Including pain rating scale, medication(s)/side effects and non-pharmacologic comfort measures 12/02/2023 0651 by Debby Truett FALCON, RN Outcome: Progressing 12/02/2023 0416 by Debby Truett FALCON, RN Outcome: Progressing   Problem: Health Behavior/Discharge Planning: Goal: Ability to manage health-related needs will improve 12/02/2023 0651 by Debby Truett FALCON, RN Outcome: Progressing 12/02/2023 0416 by Debby Truett FALCON, RN Outcome: Progressing   Problem: Clinical Measurements: Goal: Ability to maintain clinical measurements within normal limits will improve 12/02/2023 0651 by Debby Truett FALCON, RN Outcome: Progressing 12/02/2023 0416 by Debby Truett FALCON, RN Outcome: Progressing Goal: Will remain free from infection 12/02/2023 0651 by Debby Truett FALCON, RN Outcome: Progressing 12/02/2023 0416 by Debby Truett FALCON, RN Outcome: Progressing Goal: Diagnostic test results will improve 12/02/2023 0651 by Debby Truett FALCON, RN Outcome: Progressing 12/02/2023 0416 by Debby Truett FALCON, RN Outcome: Progressing Goal: Respiratory complications will improve 12/02/2023 0651 by Debby Truett FALCON, RN Outcome: Progressing 12/02/2023 0416 by Debby Truett FALCON, RN Outcome: Progressing Goal: Cardiovascular complication will be avoided 12/02/2023 0651 by Debby Truett FALCON, RN Outcome: Progressing 12/02/2023 0416 by Debby Truett FALCON, RN Outcome: Progressing   Problem: Activity: Goal: Risk for activity intolerance will decrease 12/02/2023 0651 by Debby Truett FALCON, RN Outcome: Progressing 12/02/2023 0416 by Debby Truett FALCON, RN Outcome: Progressing   Problem: Nutrition: Goal: Adequate nutrition will be maintained 12/02/2023 0651 by Debby Truett FALCON, RN Outcome: Progressing 12/02/2023 0416 by Debby Truett FALCON, RN Outcome: Progressing   Problem: Coping: Goal: Level of anxiety will decrease 12/02/2023 0651 by Debby Truett FALCON, RN Outcome:  Progressing 12/02/2023 0416 by Debby Truett FALCON, RN Outcome: Progressing   Problem: Elimination: Goal: Will not experience complications related to bowel motility 12/02/2023 0651 by Debby Truett FALCON, RN Outcome: Progressing 12/02/2023 0416 by Debby Truett FALCON, RN Outcome: Progressing Goal: Will not experience complications related to urinary retention 12/02/2023 0651 by Debby Truett FALCON, RN Outcome: Progressing 12/02/2023 0416 by Debby Truett FALCON, RN Outcome: Progressing   Problem: Pain Management: Goal: General experience of comfort will improve 12/02/2023 0651 by Debby Truett FALCON, RN Outcome: Progressing 12/02/2023 0416 by Debby Truett FALCON, RN Outcome: Progressing   Problem: Safety: Goal: Ability to remain free from injury will improve 12/02/2023 0651 by Debby Truett FALCON, RN Outcome: Progressing 12/02/2023 0416 by Debby Truett FALCON, RN Outcome: Progressing   Problem: Skin Integrity: Goal: Risk for impaired skin integrity will decrease 12/02/2023 0651 by Debby Truett FALCON, RN Outcome: Progressing 12/02/2023 0416 by Debby Truett FALCON, RN Outcome: Progressing

## 2023-12-02 NOTE — Evaluation (Signed)
 Occupational Therapy Evaluation Patient Details Name: Thomas [name] MRN: [id number] DOB: 29-Dec-1945 Today's Date: 12/02/2023   History of Present Illness Pt is a 78 y/o M admitted on 11/30/23 after presenting with c/o abdominal pain & vomiting. CT of abdomen/pelvis concerning for colonic malignancy & high-grade bowel obstruction.  Pt underwent extended R hemicolectomy with primary anastomosis on 11/30/22. PMH: HTN, DVT, Iron deficiency anemia   Clinical Impression   Mr. Lott was seen for OT evaluation this date. Prior to hospital admission, pt was independent with ADL/IADL mgt. 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 Pt lives with his brother in a mobile home with ~3 STE. Pt presents to acute OT demonstrating impaired ADL performance and functional mobility 2/2 increased abdominal pain with mobility, decreased LB access, decreased balance, and decreased awareness of AE/DME for ADL management (See OT problem list for additional functional deficits). Pt currently requires CGA for safety with functional mobility as well as MIN A For LB ADL Management.  Pt would benefit from skilled OT services to address noted impairments and functional limitations (see below for any additional details) in order to maximize safety and independence while minimizing falls risk and caregiver burden. Anticipate the need for follow up OT services in the home setting upon acute hospital DC.        If plan is discharge home, recommend the following: A little help with walking and/or transfers;A little help with bathing/dressing/bathroom;Assistance with cooking/housework;Assist for transportation;Help with stairs or ramp for entrance    Functional Status Assessment  Patient has had a recent decline in their functional status and demonstrates the ability to make significant improvements in function in a reasonable and predictable amount of time.  Equipment Recommendations  BSC/3in1     Recommendations for Other Services       Precautions / Restrictions Precautions Precautions: Fall Restrictions Weight Bearing Restrictions Per Provider Order: No      Mobility Bed Mobility Overal bed mobility: Needs Assistance             General bed mobility comments: Pt seated in recliner at start/end of session.    Transfers Overall transfer level: Needs assistance Equipment used: Rolling walker (2 wheels) Transfers: Sit to/from Stand Sit to Stand: Contact guard assist                  Balance Overall balance assessment: Needs assistance Sitting-balance support: Feet supported, No upper extremity supported Sitting balance-Leahy Scale: Good     Standing balance support: During functional activity, Bilateral upper extremity supported, Reliant on assistive device for balance Standing balance-Leahy Scale: Fair                             ADL either performed or assessed with clinical judgement   ADL Overall ADL's : Needs assistance/impaired                                       General ADL Comments: Decreased LB access 2/2 abdominal incision/pain as well as decreased activity tolerance. MIN A for LB ADL management, CGA For safety with functional mobility/transfers as well as assist for mgt of lines/leads. Educated on abdominal bracing for improved comfort with bed mobility, positional changes, coughing, etc.     Vision Baseline Vision/History: 1 Wears glasses Ability to See in Adequate Light: 1 Impaired Patient Visual Report:  No change from baseline       Perception         Praxis         Pertinent Vitals/Pain Pain Assessment Pain Assessment: 0-10 Pain Score: 3  Pain Location: abdomen with some movement, coughing Pain Descriptors / Indicators: Discomfort, Guarding, Grimacing Pain Intervention(s): Limited activity within patient's tolerance, Monitored during session, Other (comment) (Provided pillow for abdominal  bracing with positional changes, coughing, etc.)     Extremity/Trunk Assessment Upper Extremity Assessment Upper Extremity Assessment: Overall WFL for tasks assessed   Lower Extremity Assessment Lower Extremity Assessment: Generalized weakness   Cervical / Trunk Assessment Cervical / Trunk Assessment: Normal   Communication Communication Communication: Hearing impairment Cueing Techniques: Verbal cues;Gestural cues   Cognition Arousal: Alert Behavior During Therapy: WFL for tasks assessed/performed Overall Cognitive Status: Within Functional Limits for tasks assessed                                 General Comments: pleasant, coversational t/o session.     General Comments  SpO2 remained >/= 90% throughout session when intermittently checked, pt left on room air.    Exercises Other Exercises Other Exercises: Pt educated on role of OT in acute setting, safety, falls prevention strategies, and safe use of AE/DME for ADL management t/o session.   Shoulder Instructions      Home Living Family/patient expects to be discharged to:: Private residence Living Arrangements: Other relatives (Brother) Available Help at Discharge: Family (Pt is caregiver for brother with dementia) Type of Home: Mobile home Home Access: Stairs to enter Entrance Stairs-Number of Steps: 3 Entrance Stairs-Rails: Right Home Layout: One level     Bathroom Shower/Tub: Producer, Television/film/video: Standard     Home Equipment: Cane - single point          Prior Functioning/Environment Prior Level of Function : Driving;Independent/Modified Independent             Mobility Comments: Independent, denies falls. ADLs Comments: Generally independent, does light meal prep for himself and his brother, responsible for IADL management and providing supervision for his brother who has dementia.        OT Problem List: Decreased strength;Pain;Decreased range of motion;Decreased  activity tolerance;Impaired balance (sitting and/or standing);Decreased coordination      OT Treatment/Interventions: Self-care/ADL training;Therapeutic exercise;Energy conservation;Patient/family education;Balance training;DME and/or AE instruction;Therapeutic activities    OT Goals(Current goals can be found in the care plan section) Acute Rehab OT Goals Patient Stated Goal: To feel better OT Goal Formulation: With patient Time For Goal Achievement: 12/16/23 Potential to Achieve Goals: Good ADL Goals Pt Will Perform Grooming: sitting;standing;with modified independence Pt Will Perform Lower Body Dressing: sit to/from stand;with adaptive equipment;with modified independence Pt Will Transfer to Toilet: ambulating;with modified independence;bedside commode;regular height toilet Pt Will Perform Toileting - Clothing Manipulation and hygiene: with set-up;with supervision;sit to/from stand  OT Frequency: Min 1X/week    Co-evaluation              AM-PAC OT 6 Clicks Daily Activity     Outcome Measure Help from another person eating meals?: A Little (Based on function only, NPO on NGT at time of eval) Help from another person taking care of personal grooming?: A Little Help from another person toileting, which includes using toliet, bedpan, or urinal?: A Little Help from another person bathing (including washing, rinsing, drying)?: A Little Help from another person to put  on and taking off regular upper body clothing?: A Little Help from another person to put on and taking off regular lower body clothing?: A Little 6 Click Score: 18   End of Session Equipment Utilized During Treatment: Gait belt;Rolling walker (2 wheels) Nurse Communication: Mobility status  Activity Tolerance: Patient tolerated treatment well Patient left: in chair;with call bell/phone within reach;with chair alarm set  OT Visit Diagnosis: Other abnormalities of gait and mobility (R26.89);Muscle weakness  (generalized) (M62.81);Pain Pain - Right/Left:  (abdomen)                Time: 8996-8982 OT Time Calculation (min): 14 min Charges:  OT General Charges $OT Visit: 1 Visit OT Evaluation $OT Eval Moderate Complexity: 1 Mod  Jaidev Sanger Shasta, M.S., OTR/L 12/02/23, 11:17 AM

## 2023-12-02 NOTE — Progress Notes (Signed)
 PHARMACY - ANTICOAGULATION CONSULT NOTE  Pharmacy Consult for heparin  infusion Indication: antithrombin III deficiency  Allergies  Allergen Reactions   Dexamethasone Other (See Comments)    Renal Insufficiency    Patient Measurements: Height: 6' (182.9 cm) Weight: 70.8 kg (156 lb) IBW/kg (Calculated) : 77.6 Heparin  Dosing Weight: 70.8 kg  Vital Signs: Temp: 98 F (36.7 C) (01/02 0806) BP: 124/75 (01/02 0806) Pulse Rate: 78 (01/02 0806)  Labs: Recent Labs    12/01/23 0459 12/01/23 0715 12/01/23 1222 12/01/23 1852 12/02/23 0429  HGB 11.8*  --   --  12.8* 12.7*  HCT 36.3*  --   --  41.0 40.0  PLT 220  --   --  219 187  LABPROT  --  48.4* 20.9* 18.7*  --   INR  --  5.2* 1.8* 1.5*  --   CREATININE 0.92  --   --  0.94 0.81    Estimated Creatinine Clearance: 76.5 mL/min (by C-G formula based on SCr of 0.81 mg/dL).   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  He is on warfarin prior to admission (last INR 1.5) and is being placed on IV heparin  until warfarin can be resumed  Goal of Therapy:  Heparin  level 0.3-0.7 units/ml Monitor platelets by anticoagulation protocol: Yes   Plan:  Give 4000 units bolus x 1 Start heparin  infusion at 1000 units/hr Check anti-Xa level in 8 hours and daily while on heparin  Continue to monitor H&H and platelets  Adriana JONETTA Bolster 12/02/2023,2:19 PM

## 2023-12-02 NOTE — Progress Notes (Signed)
 Patient ID: Thomas [name], male   DOB: 06/25/1946, 78 y.o.   MRN: [id number]     SURGICAL PROGRESS NOTE   Hospital Day(s): 2.   Interval History: Patient seen and examined, no acute events or new complaints overnight. Patient reports feeling okay with soreness on the abdomen.  Denies any severe pain.  Denies any nausea or vomiting.  Denies any gas or bowel movement  Vital signs in last 24 hours: [min-max] current  Temp:  [97.5 F (36.4 C)-98.1 F (36.7 C)] 97.5 F (36.4 C) (01/02 0549) Pulse Rate:  [64-96] 79 (01/02 0549) Resp:  [11-22] 18 (01/02 0549) BP: (111-148)/(61-96) 124/69 (01/02 0549) SpO2:  [86 %-100 %] 100 % (01/02 0549)     Height: 6' (182.9 cm) Weight: 70.8 kg BMI (Calculated): 21.15   Physical Exam:  Constitutional: alert, cooperative and no distress  Respiratory: breathing non-labored at rest  Cardiovascular: regular rate and sinus rhythm  Gastrointestinal: soft, non-tender, and mild-distended  Labs:     Latest Ref Rng & Units 12/02/2023    4:29 AM 12/01/2023    6:52 PM 12/01/2023    4:59 AM  CBC  WBC 4.0 - 10.5 K/uL 9.6  6.5  5.0   Hemoglobin 13.0 - 17.0 g/dL 87.2  87.1  88.1   Hematocrit 39.0 - 52.0 % 40.0  41.0  36.3   Platelets 150 - 400 K/uL 187  219  220       Latest Ref Rng & Units 12/02/2023    4:29 AM 12/01/2023    6:52 PM 12/01/2023    4:59 AM  CMP  Glucose 70 - 99 mg/dL 94  888  99   BUN 8 - 23 mg/dL 29  38  42   Creatinine 0.61 - 1.24 mg/dL 9.18  9.05  9.07   Sodium 135 - 145 mmol/L 137  137  135   Potassium 3.5 - 5.1 mmol/L 3.4  3.3  3.3   Chloride 98 - 111 mmol/L 102  100  97   CO2 22 - 32 mmol/L 22  24  26    Calcium  8.9 - 10.3 mg/dL 7.8  7.8  8.4     Imaging studies: No new pertinent imaging studies   Assessment/Plan:  78 y.o. male with mass of the transverse colon with obstruction 1 Day Post-Op s/p extended right hemicolectomy, complicated by pertinent comorbidities including Antithrombin III deficiency.  -Patient recovering from surgery  adequately.  No sign of complications -Will continue with bowel rest, NGT to suction until ileus resolves -PT/OT consulted to mobilize patient -No sign of bleeding in the first 24 hours after surgery.  May restart heparin  drip as a short acting anticoagulation to eventually transition to oral anticoagulation once we rule out any early complications or need of other interventions -Discontinue Foley catheter -Continue IV fluids -Continue pain management -Continue Prevena in place -Waiting for pathology.  Patient might need hematology/oncology evaluation once we get the results from the pathology for evaluation and management of colon cancer and hypercoagulable state disorder   Lucas Petrin, MD

## 2023-12-02 NOTE — Evaluation (Signed)
 Physical Therapy Evaluation Patient Details Name: Thomas [name] MRN: [id number] DOB: 1946-03-15 Today's Date: 12/02/2023  History of Present Illness  Pt is a 78 y/o M admitted on 11/30/23 after presenting with c/o abdominal pain & vomiting. CT of abdomen/pelvis concerning for colonic malignancy & high-grade bowel obstruction.  Pt underwent extended R hemicolectomy with primary anastomosis on 11/30/22. PMH: HTN, DVT, Iron deficiency anemia  Clinical Impression  Pt seen for PT evaluation with pt agreeable to tx. Pt reports prior to admission he was mod I with SPC, living with brother (who has Alzheimer's) in mobile home with 3 steps with L rail to enter. Pt reports he performed cooking, denies falls. On this date, PT educated pt on log rolling technique for decreased pain with bed mobility. Pt requests to walk with RW vs IV pole (to simulate SPC) just to be safe. Pt is able to ambulate around nurses station with RW & CGA, ongoing cuing to ambulate within base of AD. Pt would benefit from ongoing skilled PT treatment to address balance, strengthening, gait & stair negotiation with LRAD.        If plan is discharge home, recommend the following: A little help with walking and/or transfers;A little help with bathing/dressing/bathroom;Assistance with cooking/housework;Assist for transportation;Help with stairs or ramp for entrance   Can travel by private vehicle        Equipment Recommendations Rolling walker (2 wheels)  Recommendations for Other Services       Functional Status Assessment Patient has had a recent decline in their functional status and demonstrates the ability to make significant improvements in function in a reasonable and predictable amount of time.     Precautions / Restrictions Precautions Precautions: Fall Restrictions Weight Bearing Restrictions Per Provider Order: No      Mobility  Bed Mobility Overal bed mobility: Needs Assistance Bed Mobility: Rolling,  Sidelying to Sit Rolling: Supervision, Used rails Sidelying to sit: Supervision, Used rails, HOB elevated       General bed mobility comments: education re: technique for log rolling    Transfers Overall transfer level: Needs assistance Equipment used: Rolling walker (2 wheels) Transfers: Sit to/from Stand Sit to Stand: Supervision           General transfer comment: cuing re: hand placement to push to standing    Ambulation/Gait Ambulation/Gait assistance: Contact guard assist Gait Distance (Feet): 160 Feet Assistive device: Rolling walker (2 wheels) Gait Pattern/deviations: Decreased step length - right, Decreased step length - left, Decreased stride length Gait velocity: decreased     General Gait Details: cuing to ambulate within base of AD vs pushing it in front  Stairs            Wheelchair Mobility     Tilt Bed    Modified Rankin (Stroke Patients Only)       Balance Overall balance assessment: Needs assistance Sitting-balance support: Feet supported, No upper extremity supported Sitting balance-Leahy Scale: Good     Standing balance support: During functional activity, Bilateral upper extremity supported, Reliant on assistive device for balance Standing balance-Leahy Scale: Fair                               Pertinent Vitals/Pain Pain Assessment Pain Assessment: Faces Faces Pain Scale: Hurts even more Pain Location: abdomen with some movement, coughing Pain Descriptors / Indicators: Discomfort, Guarding, Grimacing Pain Intervention(s): Monitored during session, Limited activity within patient's tolerance    Home  Living Family/patient expects to be discharged to:: Private residence Living Arrangements:  (brother) Available Help at Discharge: Family (lives with brother who has Alzheimer's) Type of Home: Mobile home Home Access: Stairs to enter Entrance Stairs-Rails: Right Entrance Stairs-Number of Steps: 3   Home Layout:  One level Home Equipment: Cane - single point      Prior Function Prior Level of Function : Driving;Independent/Modified Independent             Mobility Comments: denies falls       Extremity/Trunk Assessment   Upper Extremity Assessment Upper Extremity Assessment: Overall WFL for tasks assessed    Lower Extremity Assessment Lower Extremity Assessment: Generalized weakness       Communication   Communication Communication: Hearing impairment  Cognition Arousal: Alert Behavior During Therapy: WFL for tasks assessed/performed Overall Cognitive Status: Within Functional Limits for tasks assessed                                 General Comments: very pleasant, joking throughout session        General Comments General comments (skin integrity, edema, etc.): Pt received on 4L/min, weaned to room air, SpO2 remained >/= 90% throughout session when intermittently checked, pt left on room air.    Exercises     Assessment/Plan    PT Assessment Patient needs continued PT services  PT Problem List Decreased strength;Pain;Cardiopulmonary status limiting activity;Decreased activity tolerance;Decreased balance;Decreased mobility;Decreased knowledge of use of DME;Decreased knowledge of precautions       PT Treatment Interventions Modalities;DME instruction;Gait training;Balance training;Neuromuscular re-education;Stair training;Functional mobility training;Patient/family education;Therapeutic activities;Therapeutic exercise;Manual techniques    PT Goals (Current goals can be found in the Care Plan section)  Acute Rehab PT Goals Patient Stated Goal: decreased pain PT Goal Formulation: With patient Time For Goal Achievement: 12/16/23 Potential to Achieve Goals: Good    Frequency Min 1X/week     Co-evaluation               AM-PAC PT 6 Clicks Mobility  Outcome Measure Help needed turning from your back to your side while in a flat bed without  using bedrails?: None Help needed moving from lying on your back to sitting on the side of a flat bed without using bedrails?: A Little Help needed moving to and from a bed to a chair (including a wheelchair)?: A Little Help needed standing up from a chair using your arms (e.g., wheelchair or bedside chair)?: A Little Help needed to walk in hospital room?: A Little Help needed climbing 3-5 steps with a railing? : A Little 6 Click Score: 19    End of Session   Activity Tolerance: Patient tolerated treatment well Patient left: in chair;with call bell/phone within reach Nurse Communication: Mobility status (O2) PT Visit Diagnosis: Unsteadiness on feet (R26.81);Muscle weakness (generalized) (M62.81)    Time: 0931-1000 PT Time Calculation (min) (ACUTE ONLY): 29 min   Charges:   PT Evaluation $PT Eval Moderate Complexity: 1 Mod   PT General Charges $$ ACUTE PT VISIT: 1 Visit        [name], PT, DPT 12/02/23, 10:15 AM   [name] 12/02/2023, 10:13 AM

## 2023-12-02 NOTE — Progress Notes (Signed)
 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 He had not been eating well at home before multiple days due to worsening abdominal distention and pain.  He had been still taking Coumadin .  CT abdomen/pelvis showed apple core lesion within the proximal transverse colon measuring 4.3 cm in length concerning for colonic malignancy and high-grade bowel obstruction due to this. General surgery was consulted and patient was admitted for further workup.  INR was markedly elevated, 9.4 on admission and he underwent reversal with vitamin K  and FFP. He then underwent right hemicolectomy with primary anastomosis with general surgery on 12/01/2023.  Interval History:  Tolerated surgery well yesterday.  Sitting on edge of bed when seen this morning with essentially no complaints.  Denied much abdominal pain.  Has no nausea nor vomiting.  Was actually asking for ice chips when able.  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 Level 5 (Walks with assist in room/hall) - Balance while stepping forward/back and can walk in room with assist - Complete Level 5 (Walks with assist in room/hall) - Balance while stepping forward/back and can walk in room with assist - Complete Level 5 (Walks with assist in room/hall) - Balance while stepping forward/back and can walk in room with assist - Complete Level 4 (Walks with assist in room) - Balance while marching in place and cannot step forward and back - Complete Level 4 (Walks with assist in room) -  Balance while marching in place and cannot step forward and back - Complete            Barriers to discharge: none Disposition Plan:  Home HH orders placed: TBD Status is: Inpt  Objective: Blood pressure 124/75, pulse 78, temperature 98 F (36.7 C), resp. rate 18, height 6' (1.829 m), weight  70.8 kg, SpO2 99%.  Examination:  Physical Exam Constitutional:      General: He is not in acute distress.    Appearance: Normal appearance.  HENT:     Head: Normocephalic and atraumatic.     Mouth/Throat:     Mouth: Mucous membranes are moist.  Eyes:     Extraocular Movements: Extraocular movements intact.  Cardiovascular:     Rate and Rhythm: Normal rate and regular rhythm.  Pulmonary:     Effort: Pulmonary effort is normal. No respiratory distress.     Breath sounds: Normal breath sounds. No wheezing.  Abdominal:     General: Bowel sounds are decreased. There is no distension.     Palpations: Abdomen is soft.     Tenderness: There is abdominal tenderness (Appropriate from post op).     Comments: Midline surgical incision noted with wound VAC in place  Musculoskeletal:        General: Normal range of motion.     Cervical back: Normal range of motion and neck supple.  Skin:    General: Skin is warm and dry.  Neurological:     General: No focal deficit present.     Mental Status: He is alert.  Psychiatric:        Mood and Affect: Mood normal.        Behavior: Behavior normal.      Consultants:  General surgery  Procedures:    Data Reviewed: Results for orders placed or performed during the hospital encounter of 11/30/23 (from the past 24 hours)  Basic metabolic panel     Status: Abnormal   Collection Time: 12/01/23  6:52 PM  Result Value Ref Range   Sodium 137 135 - 145 mmol/L   Potassium 3.3 (L) 3.5 - 5.1 mmol/L   Chloride 100 98 - 111 mmol/L   CO2 24 22 - 32 mmol/L   Glucose, Bld 111 (H) 70 - 99 mg/dL   BUN 38 (H) 8 - 23 mg/dL   Creatinine, Ser 9.05 0.61 - 1.24 mg/dL   Calcium  7.8 (L) 8.9 - 10.3 mg/dL   GFR, Estimated >39 >39 mL/min   Anion gap 13 5 - 15  CBC     Status: Abnormal   Collection Time: 12/01/23  6:52 PM  Result Value Ref Range   WBC 6.5 4.0 - 10.5 K/uL   RBC 4.67 4.22 - 5.81 MIL/uL   Hemoglobin 12.8 (L) 13.0 - 17.0 g/dL   HCT 58.9 60.9 -  47.9 %   MCV 87.8 80.0 - 100.0 fL   MCH 27.4 26.0 - 34.0 pg   MCHC 31.2 30.0 - 36.0 g/dL   RDW 84.9 88.4 - 84.4 %   Platelets 219 150 - 400 K/uL   nRBC 0.0 0.0 - 0.2 %  Protime-INR     Status: Abnormal   Collection Time: 12/01/23  6:52 PM  Result Value Ref Range   Prothrombin Time 18.7 (H) 11.4 - 15.2 seconds   INR 1.5 (H) 0.8 - 1.2  Basic metabolic panel     Status: Abnormal   Collection Time: 12/02/23  4:29 AM  Result Value Ref Range  Sodium 137 135 - 145 mmol/L   Potassium 3.4 (L) 3.5 - 5.1 mmol/L   Chloride 102 98 - 111 mmol/L   CO2 22 22 - 32 mmol/L   Glucose, Bld 94 70 - 99 mg/dL   BUN 29 (H) 8 - 23 mg/dL   Creatinine, Ser 9.18 0.61 - 1.24 mg/dL   Calcium  7.8 (L) 8.9 - 10.3 mg/dL   GFR, Estimated >39 >39 mL/min   Anion gap 13 5 - 15  CBC with Differential/Platelet     Status: Abnormal   Collection Time: 12/02/23  4:29 AM  Result Value Ref Range   WBC 9.6 4.0 - 10.5 K/uL   RBC 4.72 4.22 - 5.81 MIL/uL   Hemoglobin 12.7 (L) 13.0 - 17.0 g/dL   HCT 59.9 60.9 - 47.9 %   MCV 84.7 80.0 - 100.0 fL   MCH 26.9 26.0 - 34.0 pg   MCHC 31.8 30.0 - 36.0 g/dL   RDW 85.1 88.4 - 84.4 %   Platelets 187 150 - 400 K/uL   nRBC 0.0 0.0 - 0.2 %   Neutrophils Relative % 85 %   Neutro Abs 8.2 (H) 1.7 - 7.7 K/uL   Lymphocytes Relative 10 %   Lymphs Abs 1.0 0.7 - 4.0 K/uL   Monocytes Relative 4 %   Monocytes Absolute 0.4 0.1 - 1.0 K/uL   Eosinophils Relative 0 %   Eosinophils Absolute 0.0 0.0 - 0.5 K/uL   Basophils Relative 0 %   Basophils Absolute 0.0 0.0 - 0.1 K/uL   WBC Morphology MORPHOLOGY UNREMARKABLE    RBC Morphology MORPHOLOGY UNREMARKABLE    Smear Review Normal platelet morphology    Immature Granulocytes 1 %   Abs Immature Granulocytes 0.05 0.00 - 0.07 K/uL  Magnesium     Status: None   Collection Time: 12/02/23  4:29 AM  Result Value Ref Range   Magnesium 2.2 1.7 - 2.4 mg/dL    I have reviewed pertinent nursing notes, vitals, labs, and images as necessary. I have  ordered labwork to follow up on as indicated.  I have reviewed the last notes from staff over past 24 hours. I have discussed patient's care plan and test results with nursing staff, CM/SW, and other staff as appropriate.  Time spent: Greater than 50% of the 55 minute visit was spent in counseling/coordination of care for the patient as laid out in the A&P.   LOS: 2 days   Alm Apo, MD [facility] 12/02/2023, 2:19 PM

## 2023-12-03 ENCOUNTER — Inpatient Hospital Stay: Payer: 59

## 2023-12-03 DIAGNOSIS — D6859 Other primary thrombophilia: Secondary | ICD-10-CM | POA: Diagnosis not present

## 2023-12-03 DIAGNOSIS — R112 Nausea with vomiting, unspecified: Secondary | ICD-10-CM | POA: Diagnosis not present

## 2023-12-03 DIAGNOSIS — K56609 Unspecified intestinal obstruction, unspecified as to partial versus complete obstruction: Secondary | ICD-10-CM | POA: Diagnosis not present

## 2023-12-03 LAB — BASIC METABOLIC PANEL
Anion gap: 13 (ref 5–15)
BUN: 23 mg/dL (ref 8–23)
CO2: 22 mmol/L (ref 22–32)
Calcium: 7.7 mg/dL — ABNORMAL LOW (ref 8.9–10.3)
Chloride: 103 mmol/L (ref 98–111)
Creatinine, Ser: 0.85 mg/dL (ref 0.61–1.24)
GFR, Estimated: >60 mL/min (ref >60)
Glucose, Bld: 75 mg/dL (ref 70–99)
Potassium: 3.4 mmol/L — ABNORMAL LOW (ref 3.5–5.1)
Sodium: 138 mmol/L (ref 135–145)

## 2023-12-03 LAB — CBC WITH DIFFERENTIAL/PLATELET
Abs Immature Granulocytes: 0.04 10*3/uL (ref 0.00–0.07)
Basophils Absolute: 0 10*3/uL (ref 0.0–0.1)
Basophils Relative: 0 %
Eosinophils Absolute: 0.1 10*3/uL (ref 0.0–0.5)
Eosinophils Relative: 1 %
HCT: 35.1 % — ABNORMAL LOW (ref 39.0–52.0)
Hemoglobin: 11.2 g/dL — ABNORMAL LOW (ref 13.0–17.0)
Immature Granulocytes: 1 %
Lymphocytes Relative: 23 %
Lymphs Abs: 1.6 10*3/uL (ref 0.7–4.0)
MCH: 27.5 pg (ref 26.0–34.0)
MCHC: 31.9 g/dL (ref 30.0–36.0)
MCV: 86.2 fL (ref 80.0–100.0)
Monocytes Absolute: 0.5 10*3/uL (ref 0.1–1.0)
Monocytes Relative: 8 %
Neutro Abs: 4.7 10*3/uL (ref 1.7–7.7)
Neutrophils Relative %: 67 %
Platelets: 175 10*3/uL (ref 150–400)
RBC: 4.07 MIL/uL — ABNORMAL LOW (ref 4.22–5.81)
RDW: 15.2 % (ref 11.5–15.5)
Smear Review: NORMAL
WBC Morphology: ABNORMAL
WBC: 6.9 10*3/uL (ref 4.0–10.5)
nRBC: 0 % (ref 0.0–0.2)

## 2023-12-03 LAB — PREPARE FRESH FROZEN PLASMA

## 2023-12-03 LAB — BPAM FFP
Blood Product Expiration Date: 202501062359
Blood Product Expiration Date: 202501062359
Unit Type and Rh: 5100
Unit Type and Rh: 9500

## 2023-12-03 LAB — HEPARIN LEVEL (UNFRACTIONATED)
Heparin Unfractionated: 0.13 [IU]/mL — ABNORMAL LOW (ref 0.30–0.70)
Heparin Unfractionated: 0.55 [IU]/mL (ref 0.30–0.70)
Heparin Unfractionated: <0.1 [IU]/mL — ABNORMAL LOW (ref 0.30–0.70)

## 2023-12-03 LAB — MAGNESIUM: Magnesium: 2.1 mg/dL (ref 1.7–2.4)

## 2023-12-03 MED ORDER — HEPARIN BOLUS VIA INFUSION
2100.0000 [IU] | Freq: Once | INTRAVENOUS | Status: AC
  Administered 2023-12-03: 2100 [IU] via INTRAVENOUS
  Filled 2023-12-03: qty 2100

## 2023-12-03 MED ORDER — LABETALOL HCL 5 MG/ML IV SOLN: 10.0000 mg | INTRAVENOUS | Status: DC | PRN

## 2023-12-03 MED ORDER — HYDRALAZINE HCL 20 MG/ML IJ SOLN: 10.0000 mg | INTRAMUSCULAR | Status: DC | PRN

## 2023-12-03 MED ORDER — POTASSIUM CHLORIDE 10 MEQ/100ML IV SOLN
10.0000 meq | INTRAVENOUS | Status: AC
  Administered 2023-12-03 (×4): 10 meq via INTRAVENOUS
  Filled 2023-12-03 (×4): qty 100

## 2023-12-03 NOTE — Care Management Important Message (Signed)
 Important Message  Patient Details  Name: Thomas [name] MRN: [id number] Date of Birth: February 25, 1946   Important Message Given:  Yes - Medicare IM     Sherilyn Banker 12/03/2023, 12:13 PM

## 2023-12-03 NOTE — Plan of Care (Signed)

## 2023-12-03 NOTE — Progress Notes (Signed)
 Progress Note    JAMON HAYHURST   FMW:[id number]  DOB: July 07, 1946  DOA: 11/30/2023     3 PCP: Melvin Pao, NP  Initial CC: abd pain, N/V  Hospital Course: 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 He had not been eating well at home before multiple days due to worsening abdominal distention and pain.  He had been still taking Coumadin .  CT abdomen/pelvis showed apple core lesion within the proximal transverse colon measuring 4.3 cm in length concerning for colonic malignancy and high-grade bowel obstruction due to this. General surgery was consulted and patient was admitted for further workup.  INR was markedly elevated, 9.4 on admission and he underwent reversal with vitamin K  and FFP. He then underwent right hemicolectomy with primary anastomosis with general surgery on 12/01/2023.  Interval History:  Resting comfortably in bed this morning. Still no flatus or BM yet but also denying any N/V.   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[facility]) - Follow-up surgical pathology - Hematology/oncology consult pending pathology but will try to arrange prior to discharge  Essential hypertension - PRN meds for now  Hyperlipidemia - on lipitor at home; will resume as able   H/O deep venous thrombosis - On Coumadin  at home  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 Level 5 (Walks with assist in room/hall) - Balance while stepping forward/back and can walk in room with assist - Complete Level 5 (Walks with assist in room/hall) - Balance while stepping forward/back and can walk in room with assist - Complete Level 5 (Walks with assist in room/hall) - Balance while stepping forward/back and can walk in room with assist - Complete Level 5 (Walks with assist in room/hall) - Balance while stepping forward/back and can walk in room with assist - Complete Level 5 (Walks with assist in room/hall) - Balance while stepping forward/back and can walk in room with assist - Complete    Row Name  12/02/23 0930 12/01/23 1946 12/01/23 1200       Does patient have an order for bedrest or is patient medically unstable No - Continue assessment No - Continue assessment No - Continue assessment     What is the highest level of mobility based on the progressive mobility assessment? Level 5 (Walks with assist in room/hall) - Balance  while stepping forward/back and can walk in room with assist - Complete Level 4 (Walks with assist in room) - Balance while marching in place and cannot step forward and back - Complete Level 4 (Walks with assist in room) - Balance while marching in place and cannot step forward and back - Complete              Barriers to discharge: none Disposition Plan:  Home HH orders placed: TBD Status is: Inpt  Objective: Blood pressure 130/77, pulse 92, temperature 97.8 F (36.6 C), resp. rate 18, height 6' (1.829 m), weight 70.8 kg, SpO2 96%.  Examination:  Physical Exam Constitutional:      General: He is not in acute distress.    Appearance: Normal appearance.  HENT:     Head: Normocephalic and atraumatic.     Mouth/Throat:     Mouth: Mucous membranes are moist.  Eyes:     Extraocular Movements: Extraocular movements intact.  Cardiovascular:     Rate and Rhythm: Normal rate and regular rhythm.  Pulmonary:     Effort: Pulmonary effort is normal. No respiratory distress.     Breath sounds: Normal breath sounds. No wheezing.  Abdominal:     General: Bowel sounds are decreased. There is no distension.     Palpations: Abdomen is soft.     Tenderness: There is abdominal tenderness (Appropriate from post op).     Comments: Midline surgical incision noted with wound VAC in place  Musculoskeletal:        General: Normal range of motion.     Cervical back: Normal range of motion and neck supple.  Skin:    General: Skin is warm and dry.  Neurological:     General: No focal deficit present.     Mental Status: He is alert.  Psychiatric:        Mood and Affect: Mood normal.        Behavior: Behavior normal.      Consultants:  General surgery  Procedures:  12/01/23: Extended right hemicolectomy with primary anastomosis.   Data Reviewed: Results for orders placed or performed during the hospital encounter of 11/30/23 (from the past 24 hours)  Heparin  level (unfractionated)      Status: Abnormal   Collection Time: 12/03/23 12:07 AM  Result Value Ref Range   Heparin  Unfractionated 0.13 (L) 0.30 - 0.70 IU/mL  Basic metabolic panel     Status: Abnormal   Collection Time: 12/03/23  5:56 AM  Result Value Ref Range   Sodium 138 135 - 145 mmol/L   Potassium 3.4 (L) 3.5 - 5.1 mmol/L   Chloride 103 98 - 111 mmol/L   CO2 22 22 - 32 mmol/L   Glucose, Bld 75 70 - 99 mg/dL   BUN 23 8 - 23 mg/dL   Creatinine, Ser 9.14 0.61 - 1.24 mg/dL   Calcium  7.7 (L) 8.9 - 10.3 mg/dL   GFR, Estimated >39 >39 mL/min   Anion gap 13 5 - 15  CBC with Differential/Platelet     Status: Abnormal   Collection Time: 12/03/23  5:56 AM  Result Value Ref  Range   WBC 6.9 4.0 - 10.5 K/uL   RBC 4.07 (L) 4.22 - 5.81 MIL/uL   Hemoglobin 11.2 (L) 13.0 - 17.0 g/dL   HCT 64.8 (L) 60.9 - 47.9 %   MCV 86.2 80.0 - 100.0 fL   MCH 27.5 26.0 - 34.0 pg   MCHC 31.9 30.0 - 36.0 g/dL   RDW 84.7 88.4 - 84.4 %   Platelets 175 150 - 400 K/uL   nRBC 0.0 0.0 - 0.2 %   Neutrophils Relative % 67 %   Neutro Abs 4.7 1.7 - 7.7 K/uL   Lymphocytes Relative 23 %   Lymphs Abs 1.6 0.7 - 4.0 K/uL   Monocytes Relative 8 %   Monocytes Absolute 0.5 0.1 - 1.0 K/uL   Eosinophils Relative 1 %   Eosinophils Absolute 0.1 0.0 - 0.5 K/uL   Basophils Relative 0 %   Basophils Absolute 0.0 0.0 - 0.1 K/uL   WBC Morphology Abnormal lymphocytes present    RBC Morphology MIXED RBC POPULATION    Smear Review Normal platelet morphology    Immature Granulocytes 1 %   Abs Immature Granulocytes 0.04 0.00 - 0.07 K/uL   Ovalocytes PRESENT   Magnesium     Status: None   Collection Time: 12/03/23  5:56 AM  Result Value Ref Range   Magnesium 2.1 1.7 - 2.4 mg/dL  Heparin  level (unfractionated)     Status: Abnormal   Collection Time: 12/03/23  8:51 AM  Result Value Ref Range   Heparin  Unfractionated <0.10 (L) 0.30 - 0.70 IU/mL    I have reviewed pertinent nursing notes, vitals, labs, and images as necessary. I have ordered labwork to  follow up on as indicated.  I have reviewed the last notes from staff over past 24 hours. I have discussed patient's care plan and test results with nursing staff, CM/SW, and other staff as appropriate.  Time spent: Greater than 50% of the 55 minute visit was spent in counseling/coordination of care for the patient as laid out in the A&P.   LOS: 3 days   Alm Apo, MD [facility] 12/03/2023, 2:52 PM

## 2023-12-03 NOTE — Progress Notes (Signed)
 PHARMACY - ANTICOAGULATION CONSULT NOTE  Pharmacy Consult for heparin  infusion Indication: antithrombin III deficiency  Allergies  Allergen Reactions   Dexamethasone Other (See Comments)    Renal Insufficiency    Patient Measurements: Height: 6' (182.9 cm) Weight: 70.8 kg (156 lb) IBW/kg (Calculated) : 77.6 Heparin  Dosing Weight: 70.8 kg  Vital Signs: Temp: 98 F (36.7 C) (01/03 0204) Temp Source: Oral (01/03 0204) BP: 116/71 (01/03 0204) Pulse Rate: 87 (01/03 0204)  Labs: Recent Labs    12/01/23 0715 12/01/23 1222 12/01/23 1852 12/02/23 0429 12/03/23 0007 12/03/23 0556  HGB  --   --  12.8* 12.7*  --  11.2*  HCT  --   --  41.0 40.0  --  35.1*  PLT  --   --  219 187  --  175  LABPROT 48.4* 20.9* 18.7*  --   --   --   INR 5.2* 1.8* 1.5*  --   --   --   HEPARINUNFRC  --   --   --   --  0.13*  --   CREATININE  --   --  0.94 0.81  --  0.85    Estimated Creatinine Clearance: 72.9 mL/min (by C-G formula based on SCr of 0.85 mg/dL).   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  He is on warfarin prior to admission (last INR 1.5) and is being placed on IV heparin  until warfarin can be resumed  Goal of Therapy:  Heparin  level 0.3-0.7 units/ml Monitor platelets by anticoagulation protocol: Yes   Plan: heparin  level remains subtherapeutic despite recent rate increase ---will give 2100 units IV heparin  bolus x 1 ---will increase heparin  infusion rate to 1550  units/hr ---recheck anti-Xa level in 8 hours and daily while on heparin  ---continue to monitor H&H and platelets  Adriana JONETTA Bolster 12/03/2023,7:06 AM

## 2023-12-03 NOTE — Progress Notes (Signed)
 Physical Therapy Treatment Patient Details Name: Thomas [name] MRN: [id number] DOB: February 21, 1946 Today's Date: 12/03/2023   History of Present Illness Pt is a 78 y/o M admitted on 11/30/23 after presenting with c/o abdominal pain & vomiting. CT of abdomen/pelvis concerning for colonic malignancy & high-grade bowel obstruction.  Pt underwent extended R hemicolectomy with primary anastomosis on 11/30/22. PMH: HTN, DVT, Iron deficiency anemia    PT Comments  Pt seen for PT tx with pt agreeable. Pt attempted gait with SPC but pt requires CGA<>min assist & pt reaching for objects with other UE for balance. Pt is able to ambulate increased distances with RW & supervision with improving endurance. PT educated pt on recommendation to use RW for mobility at this time & pt agreeable as he hopes to prevent falls. Will continue to follow pt acutely to address endurance, strength, balance, gait & stair negotiation with LRAD.    If plan is discharge home, recommend the following: A little help with walking and/or transfers;A little help with bathing/dressing/bathroom;Assistance with cooking/housework;Assist for transportation;Help with stairs or ramp for entrance   Can travel by private vehicle        Equipment Recommendations  Rolling walker (2 wheels)    Recommendations for Other Services       Precautions / Restrictions Precautions Precautions: Fall Precaution Comments: abdominal wound vac Restrictions Weight Bearing Restrictions Per Provider Order: No     Mobility  Bed Mobility Overal bed mobility: Needs Assistance Bed Mobility: Supine to Sit   Sidelying to sit: Modified independent (Device/Increase time), Used rails, HOB elevated            Transfers Overall transfer level: Needs assistance Equipment used: Straight cane Transfers: Sit to/from Stand Sit to Stand: Contact guard assist           General transfer comment: STS with [facility]    Ambulation/Gait Ambulation/Gait  assistance: Supervision, Min assist, Contact guard assist Gait Distance (Feet): 10 Feet (+ >200 ft) Assistive device: Straight cane, Rolling walker (2 wheels) Gait Pattern/deviations: Decreased stride length, Decreased step length - right, Decreased step length - left, Trunk flexed Gait velocity: decreased     General Gait Details: Pt attempts ambulation with SPC with pt requiring CGA<>Min assist 2/2 decreased balance, reaching for furniture with other hand for support. Transitioned to walker & pt able to ambulate around unit, in hallway with supervision. PT provides min cuing to ambulate within base of AD with upright posture with pt able to return demonstrate but returns to flexed posture 2/2 fatigue & abdominal soreness.   Stairs Stairs:  (deferred 2/2 several IV lines, wound vac at this time)           Wheelchair Mobility     Tilt Bed    Modified Rankin (Stroke Patients Only)       Balance Overall balance assessment: Needs assistance Sitting-balance support: Feet supported, No upper extremity supported Sitting balance-Leahy Scale: Good     Standing balance support: During functional activity, Bilateral upper extremity supported, Reliant on assistive device for balance Standing balance-Leahy Scale: Fair                              Cognition Arousal: Alert Behavior During Therapy: WFL for tasks assessed/performed Overall Cognitive Status: Within Functional Limits for tasks assessed  General Comments: pleasant, coversational t/o session.        Exercises Other Exercises Other Exercises: Pt performed 3x STS from recliner without BUE support with CGA<>min assist with focus on BLE strengthening.    General Comments        Pertinent Vitals/Pain Pain Assessment Pain Assessment: Faces Faces Pain Scale: Hurts little more Pain Location: abdomen with movement Pain Descriptors / Indicators: Sore, Discomfort,  Grimacing, Guarding Pain Intervention(s): Monitored during session, Limited activity within patient's tolerance    Home Living                          Prior Function            PT Goals (current goals can now be found in the care plan section) Acute Rehab PT Goals Patient Stated Goal: decreased pain PT Goal Formulation: With patient Time For Goal Achievement: 12/16/23 Potential to Achieve Goals: Good Progress towards PT goals: Progressing toward goals    Frequency    Min 1X/week      PT Plan      Co-evaluation              AM-PAC PT 6 Clicks Mobility   Outcome Measure  Help needed turning from your back to your side while in a flat bed without using bedrails?: None Help needed moving from lying on your back to sitting on the side of a flat bed without using bedrails?: A Little Help needed moving to and from a bed to a chair (including a wheelchair)?: A Little Help needed standing up from a chair using your arms (e.g., wheelchair or bedside chair)?: A Little Help needed to walk in hospital room?: A Little Help needed climbing 3-5 steps with a railing? : A Little 6 Click Score: 19    End of Session   Activity Tolerance: Patient tolerated treatment well Patient left: in chair;with call bell/phone within reach Nurse Communication: Mobility status PT Visit Diagnosis: Unsteadiness on feet (R26.81);Muscle weakness (generalized) (M62.81);Pain Pain - part of body:  (abdomen)     Time: 8675-8652 PT Time Calculation (min) (ACUTE ONLY): 23 min  Charges:    $Therapeutic Activity: 23-37 mins PT General Charges $$ ACUTE PT VISIT: 1 Visit                     [name], PT, DPT 12/03/23, 1:55 PM   [name] 12/03/2023, 1:54 PM

## 2023-12-03 NOTE — Progress Notes (Signed)
 Patient ID: Thomas [name], male   DOB: 06/02/1946, 78 y.o.   MRN: [id number]     SURGICAL PROGRESS NOTE   Hospital Day(s): 3.   Interval History: Patient seen and examined, no acute events or new complaints overnight. Patient reports feeling well.  He denies any new complaint today.  He is still not passing gas or having any bowel movement.  Denies any nausea.  No severe abdominal pain.  Vital signs in last 24 hours: [min-max] current  Temp:  [97.9 F (36.6 C)-98.1 F (36.7 C)] 98 F (36.7 C) (01/03 0746) Pulse Rate:  [73-88] 73 (01/03 0746) Resp:  [17-18] 18 (01/03 0746) BP: (107-118)/(60-71) 107/62 (01/03 0746) SpO2:  [94 %-97 %] 94 % (01/03 0746)     Height: 6' (182.9 cm) Weight: 70.8 kg BMI (Calculated): 21.15   Physical Exam:  Constitutional: alert, cooperative and no distress  Respiratory: breathing non-labored at rest  Cardiovascular: regular rate and sinus rhythm  Gastrointestinal: soft, non-tender, and non-distended  Labs:     Latest Ref Rng & Units 12/03/2023    5:56 AM 12/02/2023    4:29 AM 12/01/2023    6:52 PM  CBC  WBC 4.0 - 10.5 K/uL 6.9  9.6  6.5   Hemoglobin 13.0 - 17.0 g/dL 88.7  87.2  87.1   Hematocrit 39.0 - 52.0 % 35.1  40.0  41.0   Platelets 150 - 400 K/uL 175  187  219       Latest Ref Rng & Units 12/03/2023    5:56 AM 12/02/2023    4:29 AM 12/01/2023    6:52 PM  CMP  Glucose 70 - 99 mg/dL 75  94  888   BUN 8 - 23 mg/dL 23  29  38   Creatinine 0.61 - 1.24 mg/dL 9.14  9.18  9.05   Sodium 135 - 145 mmol/L 138  137  137   Potassium 3.5 - 5.1 mmol/L 3.4  3.4  3.3   Chloride 98 - 111 mmol/L 103  102  100   CO2 22 - 32 mmol/L 22  22  24    Calcium  8.9 - 10.3 mg/dL 7.7  7.8  7.8     Imaging studies: No new pertinent imaging studies  Assessment/Plan:  78 y.o. male with mass of the transverse colon with obstruction 2 Day Post-Op s/p extended right hemicolectomy, complicated by pertinent comorbidities including Antithrombin III deficiency.  -No clinical  alteration.  Stable vital signs -Hemoglobin continued to be stable.  No sign of bleeding.  Continue heparin  drip for adding thrombin 3 deficiency treatment -Still no GI function return.  Continue NGT, bowel rest. -Continue pain management -Continue Prevena in place -Waiting pathology report -Encouraged the patient to ambulate.  Continue PT/OT therapies   Lucas Petrin, MD

## 2023-12-03 NOTE — Progress Notes (Signed)
 PHARMACY - ANTICOAGULATION CONSULT NOTE  Pharmacy Consult for heparin  infusion Indication: antithrombin III deficiency  Allergies  Allergen Reactions   Dexamethasone Other (See Comments)    Renal Insufficiency    Patient Measurements: Height: 6' (182.9 cm) Weight: 70.8 kg (156 lb) IBW/kg (Calculated) : 77.6 Heparin  Dosing Weight: 70.8 kg  Vital Signs: Temp: 98.1 F (36.7 C) (01/02 2039) Temp Source: Oral (01/02 2039) BP: 118/60 (01/02 2039) Pulse Rate: 88 (01/02 2039)  Labs: Recent Labs    12/01/23 0459 12/01/23 0715 12/01/23 1222 12/01/23 1852 12/02/23 0429 12/03/23 0007  HGB 11.8*  --   --  12.8* 12.7*  --   HCT 36.3*  --   --  41.0 40.0  --   PLT 220  --   --  219 187  --   LABPROT  --  48.4* 20.9* 18.7*  --   --   INR  --  5.2* 1.8* 1.5*  --   --   HEPARINUNFRC  --   --   --   --   --  0.13*  CREATININE 0.92  --   --  0.94 0.81  --     Estimated Creatinine Clearance: 76.5 mL/min (by C-G formula based on SCr of 0.81 mg/dL).   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  He is on warfarin prior to admission (last INR 1.5) and is being placed on IV heparin  until warfarin can be resumed  Goal of Therapy:  Heparin  level 0.3-0.7 units/ml Monitor platelets by anticoagulation protocol: Yes   Plan:  1/3:  HL @ 0007 = 0.13, SUBtherapeutic  - Will order heparin  2100 units IV X 1 bolus and increase drip rate to  1250 units/hr - Will recheck HL 8 hrs after rate change   Jamayah Myszka D 12/03/2023,1:09 AM

## 2023-12-04 ENCOUNTER — Inpatient Hospital Stay: Payer: 59

## 2023-12-04 DIAGNOSIS — K56609 Unspecified intestinal obstruction, unspecified as to partial versus complete obstruction: Secondary | ICD-10-CM | POA: Diagnosis not present

## 2023-12-04 DIAGNOSIS — D6859 Other primary thrombophilia: Secondary | ICD-10-CM | POA: Diagnosis not present

## 2023-12-04 LAB — CBC WITH DIFFERENTIAL/PLATELET
Abs Immature Granulocytes: 0.08 10*3/uL — ABNORMAL HIGH (ref 0.00–0.07)
Basophils Absolute: 0 10*3/uL (ref 0.0–0.1)
Basophils Relative: 0 %
Eosinophils Absolute: 0.1 10*3/uL (ref 0.0–0.5)
Eosinophils Relative: 2 %
HCT: 34.2 % — ABNORMAL LOW (ref 39.0–52.0)
Hemoglobin: 10.9 g/dL — ABNORMAL LOW (ref 13.0–17.0)
Immature Granulocytes: 1 %
Lymphocytes Relative: 26 %
Lymphs Abs: 1.9 10*3/uL (ref 0.7–4.0)
MCH: 27 pg (ref 26.0–34.0)
MCHC: 31.9 g/dL (ref 30.0–36.0)
MCV: 84.7 fL (ref 80.0–100.0)
Monocytes Absolute: 0.5 10*3/uL (ref 0.1–1.0)
Monocytes Relative: 7 %
Neutro Abs: 4.5 10*3/uL (ref 1.7–7.7)
Neutrophils Relative %: 64 %
Platelets: 193 10*3/uL (ref 150–400)
RBC: 4.04 MIL/uL — ABNORMAL LOW (ref 4.22–5.81)
RDW: 15.6 % — ABNORMAL HIGH (ref 11.5–15.5)
Smear Review: NORMAL
WBC: 7.1 10*3/uL (ref 4.0–10.5)
nRBC: 0 % (ref 0.0–0.2)

## 2023-12-04 LAB — BASIC METABOLIC PANEL
Anion gap: 14 (ref 5–15)
BUN: 23 mg/dL (ref 8–23)
CO2: 18 mmol/L — ABNORMAL LOW (ref 22–32)
Calcium: 8 mg/dL — ABNORMAL LOW (ref 8.9–10.3)
Chloride: 108 mmol/L (ref 98–111)
Creatinine, Ser: 0.89 mg/dL (ref 0.61–1.24)
GFR, Estimated: >60 mL/min (ref >60)
Glucose, Bld: 68 mg/dL — ABNORMAL LOW (ref 70–99)
Potassium: 3.5 mmol/L (ref 3.5–5.1)
Sodium: 140 mmol/L (ref 135–145)

## 2023-12-04 LAB — HEPARIN LEVEL (UNFRACTIONATED): Heparin Unfractionated: 0.43 [IU]/mL (ref 0.30–0.70)

## 2023-12-04 LAB — PROTIME-INR
INR: 1.8 — ABNORMAL HIGH (ref 0.8–1.2)
Prothrombin Time: 21.5 s — ABNORMAL HIGH (ref 11.4–15.2)

## 2023-12-04 LAB — MAGNESIUM: Magnesium: 2 mg/dL (ref 1.7–2.4)

## 2023-12-04 MED ORDER — POTASSIUM CHLORIDE 10 MEQ/100ML IV SOLN
10.0000 meq | INTRAVENOUS | Status: AC
Start: 2023-12-04 — End: 2023-12-04
  Administered 2023-12-04 (×4): 10 meq via INTRAVENOUS
  Filled 2023-12-04: qty 100

## 2023-12-04 MED ORDER — DIATRIZOATE MEGLUMINE & SODIUM 66-10 % PO SOLN
90.0000 mL | Freq: Once | ORAL | Status: AC
  Administered 2023-12-04: 90 mL via NASOGASTRIC

## 2023-12-04 NOTE — Progress Notes (Signed)
 PHARMACY - ANTICOAGULATION CONSULT NOTE  Pharmacy Consult for heparin  infusion Indication: antithrombin III deficiency  Allergies  Allergen Reactions   Dexamethasone Other (See Comments)    Renal Insufficiency    Patient Measurements: Height: 6' (182.9 cm) Weight: 70.8 kg (156 lb) IBW/kg (Calculated) : 77.6 Heparin  Dosing Weight: 70.8 kg  Vital Signs: Temp: 98.2 F (36.8 C) (01/03 1957) Temp Source: Oral (01/03 1957) BP: 116/72 (01/03 1957) Pulse Rate: 80 (01/03 1957)  Labs: Recent Labs    12/01/23 1222 12/01/23 1852 12/01/23 1852 12/02/23 0429 12/03/23 0007 12/03/23 0556 12/03/23 0851 12/03/23 2254 12/04/23 0424 12/04/23 0656  HGB  --  12.8*   < > 12.7*  --  11.2*  --   --  10.9*  --   HCT  --  41.0   < > 40.0  --  35.1*  --   --  34.2*  --   PLT  --  219   < > 187  --  175  --   --  193  --   LABPROT 20.9* 18.7*  --   --   --   --   --   --  21.5*  --   INR 1.8* 1.5*  --   --   --   --   --   --  1.8*  --   HEPARINUNFRC  --   --   --   --    < >  --  <0.10* 0.55  --  0.43  CREATININE  --  0.94   < > 0.81  --  0.85  --   --  0.89  --    < > = values in this interval not displayed.    Estimated Creatinine Clearance: 69.6 mL/min (by C-G formula based on SCr of 0.89 mg/dL).   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  He is on warfarin prior to admission (last INR 1.8) and is being placed on IV heparin  until warfarin can be resumed  Goal of  Therapy:  Heparin  level 0.3-0.7 units/ml Monitor platelets by anticoagulation protocol: Yes   Plan:  1/4:  HL @ 2254 = 0.43, therapeutic X 2 - Will continue pt on current rate and recheck HL on 1/5 @ 0700.  - Continue to monitor H&H and platelets  Yoshiaki Kreuser A Baylei Siebels 12/04/2023,7:28 AM

## 2023-12-04 NOTE — Progress Notes (Signed)
 Progress Note    Thomas [name]   FMW:[id number]  DOB: [birth year]  DOA: 11/30/2023     4 PCP: Melvin Pao, NP  Initial CC: abd pain, N/V  Hospital Course: 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 He had not been eating well at home before multiple days due to worsening abdominal distention and pain.  He had been still taking Coumadin .  CT abdomen/pelvis showed apple core lesion within the proximal transverse colon measuring 4.3 cm in length concerning for colonic malignancy and high-grade bowel obstruction due to this. General surgery was consulted and patient was admitted for further workup.  INR was markedly elevated, 9.4 on admission and he underwent reversal with vitamin K  and FFP. He then underwent right hemicolectomy with primary anastomosis with general surgery on 12/01/2023.  Interval History:  No events overnight.  Happy about having ice chips.  Continues to be hopeful his bowel function improves. Still no flatus or bowel movement.  Assessment and Plan: * Large bowel obstruction (HCC) - Secondary to apple core lesion within the proximal transverse colon -General Surgery following, appreciate assistance -S/p right hemicolectomy with primary anastomosis on 12/01/2023 - continue NPO; NG tube in place - continue WV - expected post-op ileus - continue pain/nausea control -Follow-up Gastrografin  study results - agree with TPN if no bowel function return in the next 1 to 2 days  Nausea and vomiting - Secondary to large bowel obstruction - see treatment above   Antithrombin 3 deficiency (HCC) - On Coumadin  outpatient - Patient has been compliant with Coumadin  but having extremely poor oral intake for several days likely accounting for supratherapeutic INR  on admission.  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 Level 5 (Walks with assist in room/hall) - Balance while stepping forward/back and can walk in room with assist - Complete Level 5 (Walks with assist in room/hall) - Balance while stepping forward/back and can walk in room with assist - Complete Level 5 (Walks with assist in room/hall) - Balance while stepping forward/back and can walk in room with assist - Complete Level 5 (Walks with assist in room/hall) - Balance while stepping forward/back and can walk in room with  assist - Complete Level 5 (Walks with assist in room/hall) - Balance while stepping forward/back and can walk in room with assist - Complete    Row Name 12/02/23 1011 12/02/23 0930 12/01/23 1946       Does patient have an order for bedrest or is patient medically unstable -- No - Continue assessment No - Continue  assessment     What is the highest level of mobility based on the progressive mobility assessment? Level 5 (Walks with assist in room/hall) - Balance while stepping forward/back and can walk in room with assist - Complete Level 5 (Walks with assist in room/hall) - Balance while stepping forward/back and can walk in room with assist - Complete Level 4 (Walks with assist in room) - Balance while marching in place and cannot step forward and back - Complete              Barriers to discharge: none Disposition Plan:  Home HH orders placed: TBD Status is: Inpt  Objective: Blood pressure 119/78, pulse 73, temperature 98.1 F (36.7 C), temperature source Oral, resp. rate 17, height 6' (1.829 m), weight 70.8 kg, SpO2 94%.  Examination:  Physical Exam Constitutional:      General: He is not in acute distress.    Appearance: Normal appearance.  HENT:     Head: Normocephalic and atraumatic.     Mouth/Throat:     Mouth: Mucous membranes are moist.  Eyes:     Extraocular Movements: Extraocular movements intact.  Cardiovascular:     Rate and Rhythm: Normal rate and regular rhythm.  Pulmonary:     Effort: Pulmonary effort is normal. No respiratory distress.     Breath sounds: Normal breath sounds. No wheezing.  Abdominal:     General: Bowel sounds are decreased. There is no distension.     Palpations: Abdomen is soft.     Tenderness: There is abdominal tenderness (Appropriate from post op).     Comments: Midline surgical incision noted with wound VAC in place  Musculoskeletal:        General: Normal range of motion.     Cervical back: Normal range of motion and neck supple.  Skin:    General: Skin is warm and dry.  Neurological:     General: No focal deficit present.     Mental Status: He is alert.  Psychiatric:        Mood and Affect: Mood normal.        Behavior: Behavior normal.      Consultants:  General surgery  Procedures:  12/01/23: Extended right hemicolectomy with  primary anastomosis.   Data Reviewed: Results for orders placed or performed during the hospital encounter of 11/30/23 (from the past 24 hours)  Heparin  level (unfractionated)     Status: None   Collection Time: 12/03/23 10:54 PM  Result Value Ref Range   Heparin  Unfractionated 0.55 0.30 - 0.70 IU/mL  Basic metabolic panel     Status: Abnormal   Collection Time: 12/04/23  4:24 AM  Result Value Ref Range   Sodium 140 135 - 145 mmol/L   Potassium 3.5 3.5 - 5.1 mmol/L   Chloride 108 98 - 111 mmol/L   CO2 18 (L) 22 - 32 mmol/L   Glucose, Bld 68 (L) 70 - 99 mg/dL   BUN 23 8 - 23 mg/dL   Creatinine, Ser 9.10 0.61 - 1.24 mg/dL   Calcium  8.0 (L) 8.9 - 10.3 mg/dL   GFR, Estimated >39 >39 mL/min  Anion gap 14 5 - 15  CBC with Differential/Platelet     Status: Abnormal   Collection Time: 12/04/23  4:24 AM  Result Value Ref Range   WBC 7.1 4.0 - 10.5 K/uL   RBC 4.04 (L) 4.22 - 5.81 MIL/uL   Hemoglobin 10.9 (L) 13.0 - 17.0 g/dL   HCT 65.7 (L) 60.9 - 47.9 %   MCV 84.7 80.0 - 100.0 fL   MCH 27.0 26.0 - 34.0 pg   MCHC 31.9 30.0 - 36.0 g/dL   RDW 84.3 (H) 88.4 - 84.4 %   Platelets 193 150 - 400 K/uL   nRBC 0.0 0.0 - 0.2 %   Neutrophils Relative % 64 %   Neutro Abs 4.5 1.7 - 7.7 K/uL   Lymphocytes Relative 26 %   Lymphs Abs 1.9 0.7 - 4.0 K/uL   Monocytes Relative 7 %   Monocytes Absolute 0.5 0.1 - 1.0 K/uL   Eosinophils Relative 2 %   Eosinophils Absolute 0.1 0.0 - 0.5 K/uL   Basophils Relative 0 %   Basophils Absolute 0.0 0.0 - 0.1 K/uL   WBC Morphology MORPHOLOGY UNREMARKABLE    RBC Morphology MIXED RBC MORPHOLOGY    Smear Review Normal platelet morphology    Immature Granulocytes 1 %   Abs Immature Granulocytes 0.08 (H) 0.00 - 0.07 K/uL  Magnesium     Status: None   Collection Time: 12/04/23  4:24 AM  Result Value Ref Range   Magnesium 2.0 1.7 - 2.4 mg/dL  Protime-INR     Status: Abnormal   Collection Time: 12/04/23  4:24 AM  Result Value Ref Range   Prothrombin Time 21.5  (H) 11.4 - 15.2 seconds   INR 1.8 (H) 0.8 - 1.2  Heparin  level (unfractionated)     Status: None   Collection Time: 12/04/23  6:56 AM  Result Value Ref Range   Heparin  Unfractionated 0.43 0.30 - 0.70 IU/mL    I have reviewed pertinent nursing notes, vitals, labs, and images as necessary. I have ordered labwork to follow up on as indicated.  I have reviewed the last notes from staff over past 24 hours. I have discussed patient's care plan and test results with nursing staff, CM/SW, and other staff as appropriate.  Time spent: Greater than 50% of the 55 minute visit was spent in counseling/coordination of care for the patient as laid out in the A&P.   LOS: 4 days   Alm Apo, MD [facility] 12/04/2023, 12:17 PM

## 2023-12-04 NOTE — Plan of Care (Signed)

## 2023-12-04 NOTE — Progress Notes (Signed)
 Patient ID: Thomas [name], male   DOB: Jun 06, 1946, 78 y.o.   MRN: [id number]     SURGICAL PROGRESS NOTE   Hospital Day(s): 4.   Interval History: Patient seen and examined, no acute events or new complaints overnight. Patient reports feeling well.  Patient denies any significant abdominal pain.  Still with distended abdomen.  No gas or bowel movement yet.  Vital signs in last 24 hours: [min-max] current  Temp:  [97.8 F (36.6 C)-98.2 F (36.8 C)] 98.1 F (36.7 C) (01/04 0752) Pulse Rate:  [73-92] 73 (01/04 0752) Resp:  [17-18] 17 (01/04 0752) BP: (116-130)/(72-78) 119/78 (01/04 0752) SpO2:  [94 %-96 %] 94 % (01/04 0752)     Height: 6' (182.9 cm) Weight: 70.8 kg BMI (Calculated): 21.15   Physical Exam:  Constitutional: alert, cooperative and no distress  Respiratory: breathing non-labored at rest  Cardiovascular: regular rate and sinus rhythm  Gastrointestinal: soft, non-tender, but distended  Labs:     Latest Ref Rng & Units 12/04/2023    4:24 AM 12/03/2023    5:56 AM 12/02/2023    4:29 AM  CBC  WBC 4.0 - 10.5 K/uL 7.1  6.9  9.6   Hemoglobin 13.0 - 17.0 g/dL 89.0  88.7  87.2   Hematocrit 39.0 - 52.0 % 34.2  35.1  40.0   Platelets 150 - 400 K/uL 193  175  187       Latest Ref Rng & Units 12/04/2023    4:24 AM 12/03/2023    5:56 AM 12/02/2023    4:29 AM  CMP  Glucose 70 - 99 mg/dL 68  75  94   BUN 8 - 23 mg/dL 23  23  29    Creatinine 0.61 - 1.24 mg/dL 9.10  9.14  9.18   Sodium 135 - 145 mmol/L 140  138  137   Potassium 3.5 - 5.1 mmol/L 3.5  3.4  3.4   Chloride 98 - 111 mmol/L 108  103  102   CO2 22 - 32 mmol/L 18  22  22    Calcium  8.9 - 10.3 mg/dL 8.0  7.7  7.8     Imaging studies: Abdominal x-ray shows dilation of small and large intestine consistent with ileus.   Assessment/Plan:  78 y.o. 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    -No clinical alteration.  Stable vital signs -Hemoglobin continued to be stable.  No sign of bleeding.  Continue heparin  drip for adding thrombin 3 deficiency treatment -No leukocytosis, no fever. -Still no GI function return.  Continue NGT, bowel rest.  Will try Gastrografin  challenge -If no bowel function return, agree with starting TPN -Continue pain management -Continue Prevena in place -Waiting pathology report -Encouraged the patient to ambulate.  Continue PT/OT therapies  Lucas Petrin, MD

## 2023-12-04 NOTE — Progress Notes (Signed)
 PHARMACY - ANTICOAGULATION CONSULT NOTE  Pharmacy Consult for heparin  infusion Indication: antithrombin III deficiency  Allergies  Allergen Reactions   Dexamethasone Other (See Comments)    Renal Insufficiency    Patient Measurements: Height: 6' (182.9 cm) Weight: 70.8 kg (156 lb) IBW/kg (Calculated) : 77.6 Heparin  Dosing Weight: 70.8 kg  Vital Signs: Temp: 98.2 F (36.8 C) (01/03 1957) Temp Source: Oral (01/03 1957) BP: 116/72 (01/03 1957) Pulse Rate: 80 (01/03 1957)  Labs: Recent Labs    12/01/23 0715 12/01/23 1222 12/01/23 1852 12/02/23 0429 12/03/23 0007 12/03/23 0556 12/03/23 0851 12/03/23 2254  HGB  --   --  12.8* 12.7*  --  11.2*  --   --   HCT  --   --  41.0 40.0  --  35.1*  --   --   PLT  --   --  219 187  --  175  --   --   LABPROT 48.4* 20.9* 18.7*  --   --   --   --   --   INR 5.2* 1.8* 1.5*  --   --   --   --   --   HEPARINUNFRC  --   --   --   --  0.13*  --  <0.10* 0.55  CREATININE  --   --  0.94 0.81  --  0.85  --   --     Estimated Creatinine Clearance: 72.9 mL/min (by C-G formula based on SCr of 0.85 mg/dL).   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  He is on warfarin prior to admission (last INR 1.5) and is being placed on IV heparin  until warfarin can be resumed  Goal of Therapy:  Heparin  level 0.3-0.7 units/ml Monitor platelets by anticoagulation protocol: Yes   Plan:  1/3:  HL @ 2254 = 0.55, therapeutic X 1 - Will  continue pt on current rate and recheck HL on 1/4 @ 0700.  ---continue to monitor H&H and platelets  Isatou Agredano D 12/04/2023,12:48 AM

## 2023-12-04 NOTE — TOC Initial Note (Addendum)
 Transition of Care [facility]) - Initial/Assessment Note    Patient Details  Name: Thomas [name] MRN: [id number] Date of Birth: Nov 20, 1946  Transition of Care [facility]) CM/SW Contact:    Thomas Gatton E Camira Geidel, LCSW Phone Number: 12/04/2023, 1:28 PM  Clinical Narrative:                 CSW met with patient at bedside to discuss recommendations from therapy for Home Health, RW, and 3in1. Patient lives at home with his brother Thomas [name] who he states has Alzheimers. Patient drives at baseline. PCP is Thomas [name]. [facility] [place]. Patient has a cane. Patient confirmed home address in chart is correct.  Patient states he is aware of HH/DME recommedations, but declined HH, RW, or 3in1. Patient states he does not feel he needs any of these things. Patient was encouraged to let staff know if he changes his mind prior to discharge. Patient states he is worried about his brother Thomas [name] as he has not been able to reach him and his sister could not get him to come to the door. Patient requests a Wellness check on Thomas [name]. CSW called [facility] and they stated they will dispatch the PD to do a wellness check on Thomas [name], and will call with any updates.  2:30- Received call from Officer stating they have checked on Thomas [name] and he is ok, he has family members at the home with him.  Expected Discharge Plan: Home/Self Care Barriers to Discharge: Continued Medical Work up   Patient Goals and CMS Choice Patient states their goals for this hospitalization and ongoing recovery are:: declines home health and rolling walker CMS Medicare.gov Compare Post Acute Care list provided to:: Patient Choice offered to / list presented to : Patient      Expected Discharge Plan and Services       Living arrangements for the past 2 months: Single Family Home                                      Prior Living Arrangements/Services Living arrangements for the past 2 months: Single Family Home Lives with::  Siblings Patient language and need for interpreter reviewed:: Yes Do you feel safe going back to the place where you live?: Yes      Need for Family Participation in Patient Care: Yes (Comment) Care giver support system in place?: Yes (comment) Current home services: DME Criminal Activity/Legal Involvement Pertinent to Current Situation/Hospitalization: No - Comment as needed  Activities of Daily Living   ADL Screening (condition at time of admission) Independently performs ADLs?: No Does the patient have a NEW difficulty with bathing/dressing/toileting/self-feeding that is expected to last >3 days?: Yes (Initiates electronic notice to provider for possible OT consult) Does the patient have a NEW difficulty with getting in/out of bed, walking, or climbing stairs that is expected to last >3 days?: Yes (Initiates electronic notice to provider for possible PT consult) Does the patient have a NEW difficulty with communication that is expected to last >3 days?: No Is the patient deaf or have difficulty hearing?: No Does the patient have difficulty seeing, even when wearing glasses/contacts?: No Does the patient have difficulty concentrating, remembering, or making decisions?: No  Permission Sought/Granted                  Emotional Assessment       Orientation: : Oriented to Self, Oriented to  Place, Oriented to  Time, Oriented to Situation Alcohol / Substance Use: Not Applicable Psych Involvement: No (comment)  Admission diagnosis:  Large bowel obstruction (HCC) [K56.609] Colonic mass [K63.89] Complete intestinal obstruction, unspecified cause (HCC) [K56.601] Patient Active Problem List   Diagnosis Date Noted   Large bowel obstruction (HCC) 11/30/2023   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   Nausea and vomiting 11/30/2023   Advanced care planning/counseling discussion 11/02/2023   IDA (iron deficiency anemia) 04/14/2023   Anemia 09/01/2022   Encounter for current  long-term use of anticoagulants 01/28/2021   Lymphocytosis 11/25/2020   Impaired fasting glucose 04/03/2020   History of basal cell carcinoma (BCC) excision 09/09/2018   Antithrombin 3 deficiency (HCC) 05/03/2015   Airway hyperreactivity 05/03/2015   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   H/O deep venous thrombosis 05/03/2015   Hyperlipidemia 05/03/2015   Essential hypertension 05/03/2015   History of pulmonary embolism 05/03/2015   Allergic rhinitis 03/21/2012   PCP:  Thomas Pao, NP Pharmacy:   [phone] [place] [place] - [address] [place] [address] Phone: [phone] Fax: [phone]     Social Drivers of Health (SDOH) Social History: SDOH Screenings   Food Insecurity: No Food Insecurity (12/01/2023)  Housing: Low Risk  (12/01/2023)  Transportation Needs: No Transportation Needs (12/01/2023)  Utilities: Not At Risk (12/01/2023)  Depression (PHQ2-9): Low Risk  (08/26/2023)  Financial Resource Strain: Low Risk  (01/31/2021)  Physical Activity: Inactive (01/31/2021)  Stress: No Stress Concern Present (01/31/2021)  Tobacco Use: Medium Risk (12/01/2023)   SDOH Interventions:     Readmission Risk Interventions     No data to display

## 2023-12-05 ENCOUNTER — Other Ambulatory Visit: Payer: Self-pay

## 2023-12-05 ENCOUNTER — Inpatient Hospital Stay: Payer: 59

## 2023-12-05 DIAGNOSIS — K56609 Unspecified intestinal obstruction, unspecified as to partial versus complete obstruction: Secondary | ICD-10-CM | POA: Diagnosis not present

## 2023-12-05 DIAGNOSIS — D6859 Other primary thrombophilia: Secondary | ICD-10-CM | POA: Diagnosis not present

## 2023-12-05 LAB — CBC WITH DIFFERENTIAL/PLATELET
Abs Immature Granulocytes: 0.08 K/uL — ABNORMAL HIGH (ref 0.00–0.07)
Basophils Absolute: 0 K/uL (ref 0.0–0.1)
Basophils Relative: 0 %
Eosinophils Absolute: 0.2 K/uL (ref 0.0–0.5)
Eosinophils Relative: 3 %
HCT: 35.4 % — ABNORMAL LOW (ref 39.0–52.0)
Hemoglobin: 11.3 g/dL — ABNORMAL LOW (ref 13.0–17.0)
Immature Granulocytes: 1 %
Lymphocytes Relative: 24 %
Lymphs Abs: 1.8 K/uL (ref 0.7–4.0)
MCH: 27.4 pg (ref 26.0–34.0)
MCHC: 31.9 g/dL (ref 30.0–36.0)
MCV: 85.9 fL (ref 80.0–100.0)
Monocytes Absolute: 0.5 K/uL (ref 0.1–1.0)
Monocytes Relative: 7 %
Neutro Abs: 4.8 K/uL (ref 1.7–7.7)
Neutrophils Relative %: 65 %
Platelets: 212 K/uL (ref 150–400)
RBC: 4.12 MIL/uL — ABNORMAL LOW (ref 4.22–5.81)
RDW: 15.8 % — ABNORMAL HIGH (ref 11.5–15.5)
Smear Review: NORMAL
WBC: 7.4 K/uL (ref 4.0–10.5)
nRBC: 0 % (ref 0.0–0.2)

## 2023-12-05 LAB — BASIC METABOLIC PANEL
Anion gap: 14 (ref 5–15)
BUN: 25 mg/dL — ABNORMAL HIGH (ref 8–23)
CO2: 18 mmol/L — ABNORMAL LOW (ref 22–32)
Calcium: 8 mg/dL — ABNORMAL LOW (ref 8.9–10.3)
Chloride: 109 mmol/L (ref 98–111)
Creatinine, Ser: 0.81 mg/dL (ref 0.61–1.24)
GFR, Estimated: >60 mL/min (ref >60)
Glucose, Bld: 74 mg/dL (ref 70–99)
Potassium: 3.7 mmol/L (ref 3.5–5.1)
Sodium: 141 mmol/L (ref 135–145)

## 2023-12-05 LAB — GLUCOSE, CAPILLARY
Glucose-Capillary: 66 mg/dL — ABNORMAL LOW (ref 70–99)
Glucose-Capillary: 153 mg/dL — ABNORMAL HIGH (ref 70–99)

## 2023-12-05 LAB — MAGNESIUM: Magnesium: 2.2 mg/dL (ref 1.7–2.4)

## 2023-12-05 LAB — HEPARIN LEVEL (UNFRACTIONATED): Heparin Unfractionated: 0.46 [IU]/mL (ref 0.30–0.70)

## 2023-12-05 MED ORDER — SODIUM CHLORIDE 0.9% FLUSH
10.0000 mL | Freq: Two times a day (BID) | INTRAVENOUS | Status: DC
  Administered 2023-12-05 – 2023-12-06 (×3): 10 mL
  Administered 2023-12-07: 20 mL
  Administered 2023-12-07 – 2023-12-09 (×4): 10 mL

## 2023-12-05 MED ORDER — TRACE MINERALS CU-MN-SE-ZN 300-55-60-3000 MCG/ML IV SOLN
INTRAVENOUS | Status: AC
  Filled 2023-12-05: qty 1000

## 2023-12-05 MED ORDER — INSULIN ASPART 100 UNIT/ML IJ SOLN
0.0000 [IU] | Freq: Four times a day (QID) | INTRAMUSCULAR | Status: DC
  Administered 2023-12-05: 2 [IU] via SUBCUTANEOUS
  Administered 2023-12-06 – 2023-12-07 (×7): 1 [IU] via SUBCUTANEOUS
  Filled 2023-12-05 (×8): qty 1

## 2023-12-05 MED ORDER — SODIUM CHLORIDE 0.9% FLUSH: 10.0000 mL | INTRAVENOUS | Status: DC | PRN

## 2023-12-05 MED ORDER — FAT EMUL FISH OIL/PLANT BASED 20% (SMOFLIPID)IV EMUL
250.0000 mL | INTRAVENOUS | Status: AC
  Administered 2023-12-05: 250 mL via INTRAVENOUS
  Filled 2023-12-05: qty 250

## 2023-12-05 NOTE — Progress Notes (Signed)
 PHARMACY - ANTICOAGULATION CONSULT NOTE  Pharmacy Consult for heparin  infusion Indication: antithrombin III deficiency  Allergies  Allergen Reactions   Dexamethasone Other (See Comments)    Renal Insufficiency    Patient Measurements: Height: 6' (182.9 cm) Weight: 70.8 kg (156 lb) IBW/kg (Calculated) : 77.6 Heparin  Dosing Weight: 70.8 kg  Vital Signs: Temp: 97.8 F (36.6 C) (01/05 0314) Temp Source: Oral (01/05 0314) BP: 124/66 (01/05 0314) Pulse Rate: 68 (01/05 0314)  Labs: Recent Labs    12/03/23 0556 12/03/23 0851 12/03/23 2254 12/04/23 0424 12/04/23 0656 12/05/23 0616  HGB 11.2*  --   --  10.9*  --  11.3*  HCT 35.1*  --   --  34.2*  --  35.4*  PLT 175  --   --  193  --  212  LABPROT  --   --   --  21.5*  --   --   INR  --   --   --  1.8*  --   --   HEPARINUNFRC  --    < > 0.55  --  0.43 0.46  CREATININE 0.85  --   --  0.89  --  0.81   < > = values in this interval not displayed.    Estimated Creatinine Clearance: 76.5 mL/min (by C-G formula based on SCr of 0.81 mg/dL).   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  He is on warfarin prior to admission (last INR 1.8) and is being placed on IV heparin  until warfarin can be resumed  Goal of Therapy:  Heparin  level 0.3-0.7 units/ml Monitor platelets by anticoagulation protocol: Yes   Plan:  1/05:  HL @ 0616 = 0.46, therapeutic X 3  - Will continue pt on current rate and recheck HL on 1/6 with AM  labs.  - Continue to monitor H&H and platelets  Laia Wiley D 12/05/2023,7:20 AM

## 2023-12-05 NOTE — Consult Note (Addendum)
 PHARMACY - TOTAL PARENTERAL NUTRITION CONSULT NOTE   Indication: Prolonged ileus  Patient Measurements: Height: 6' (182.9 cm) Weight: 70.8 kg (156 lb) IBW/kg (Calculated) : 77.6 TPN AdjBW (KG): 70.8 Body mass index is 21.16 kg/m. Usual Weight: 70.8 kg   Assessment: Thomas [name] is a 78 year old male that presented to the ED with abdominal pain and vomiting. CT abdomen/pelvis showed apple core lesion within the proximal transverse colon measuring 4.3 cm in length concerning for colonic malignancy and high-grade bowel obstruction due to this. General surgery was consulted and have recommended patient start TPN for a prolonged ileus.   Glucose / Insulin : sensitive SSI Q6H Electrolytes: WNL Renal: SCr 0.81  Hepatic: AST 29, ALT 14 Intake / Output; MIVF:  GI Imaging: CT abdomen pelvis concerning for colonic malignancy and high-grade bowel obstruction GI Surgeries / Procedures: 3 day post-op s/p extended right hemicolectomy   Central access: PICC line has been ordered  TPN start date: 12/05/2023   Nutritional Goals: Due to unforeseen natural disaster and resultant supply constraints, TPN will be customized to target nutritional needs based on available premix products.   Current TPN provides 50 gm of protein, 1391 Kcal, 1L   RD Assessment: Estimated Needs Total Energy Estimated Needs: 2100-2400kcal/day Total Protein Estimated Needs: 105-120g/day Total Fluid Estimated Needs: 1.8-2.1L/day  Current Nutrition: NPO  Plan:  Initiate Clinimix  (without electrolytes) 5/20 at 42 mL/hr SMOFlipid  20% over 12 hours ordered daily Thiamine  100 mg daily added to TPN per dietician recommendations  Initiate sensitive SSI Q6H and adjust as needed Monitor TPN labs daily until stable, then biweekly on Mon/Thurs  Brittain Smithey, PharmD Pharmacy Resident  12/05/2023 11:37 AM

## 2023-12-05 NOTE — Progress Notes (Signed)
 Progress Note    Thomas [name]   FMW:[id number]  DOB: 07-20-1946  DOA: 11/30/2023     5 PCP: Melvin Pao, NP  Initial CC: abd pain, N/V  Hospital Course: 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 He had not been eating well at home before multiple days due to worsening abdominal distention and pain.  He had been still taking Coumadin .  CT abdomen/pelvis showed apple core lesion within the proximal transverse colon measuring 4.3 cm in length concerning for colonic malignancy and high-grade bowel obstruction due to this. General surgery was consulted and patient was admitted for further workup.  INR was markedly elevated, 9.4 on admission and he underwent reversal with vitamin K  and FFP. He then underwent right hemicolectomy with primary anastomosis with general surgery on 12/01/2023.  Interval History:  No events overnight.  Still no flatus or bowel movement but denies any nausea or vomiting.  He is amenable with starting TPN.  Assessment and Plan: * Large bowel obstruction (HCC) - Secondary to apple core lesion within the proximal transverse colon -General Surgery following, appreciate assistance -S/p right hemicolectomy with primary anastomosis on 12/01/2023 - continue NPO; NG tube in place - continue WV - expected post-op ileus - continue pain/nausea control -Given slow return of bowel function, agree with PICC line and initiating TPN  Nausea and vomiting - Secondary to large bowel obstruction - see treatment above   Antithrombin 3 deficiency (HCC) - On Coumadin  outpatient - Patient has been compliant with Coumadin  but having extremely poor oral intake for several days likely accounting for supratherapeutic INR on admission.  No evidence of bleeding -  Initial INR 9.4 with peak at 10.4; received 10 mg vitamin K  and 2 units FFP - s/p reversal and surgery on 1/1 - Hgb now stable post-op; heparin  has been resumed   Carcinoma of transverse colon [facility]) - Follow-up surgical pathology - Hematology/oncology consult pending pathology but will try to arrange prior to discharge  Essential hypertension - PRN meds for now  Hyperlipidemia - on lipitor at home; will resume as able   H/O deep venous thrombosis - On Coumadin  at home  Clinical depression - resume home meds as able   Old records reviewed in assessment of this patient  Antimicrobials:   DVT prophylaxis:  Place TED hose Start: 11/30/23 2213   Code Status:   Code Status: Full Code  Mobility Assessment (Last 72 Hours)     Mobility Assessment     Row Name 12/05/23 0905 12/04/23 2000 12/04/23 0915 12/03/23 2015 12/03/23 1353   Does patient have an order for bedrest or is patient medically unstable No - Continue assessment No - Continue assessment No - Continue assessment No - Continue assessment --   What is the highest level of mobility based on the progressive mobility assessment? Level 5 (Walks with assist in room/hall) - Balance while stepping forward/back and can walk in room with assist - Complete Level 5 (Walks with assist in room/hall) - Balance while stepping forward/back and can walk in room with assist - Complete Level 5 (Walks with assist in room/hall) - Balance while stepping forward/back and can walk in room with assist - Complete Level 5 (Walks with assist in room/hall) - Balance while stepping forward/back and can walk in room with assist - Complete Level 5 (  Walks with assist in room/hall) - Balance while stepping forward/back and can walk in room with assist - Complete    Row Name 12/03/23 0830 12/02/23 2100 12/02/23 1111       Does patient have an order for bedrest or is patient medically unstable No - Continue assessment No - Continue assessment --     What is  the highest level of mobility based on the progressive mobility assessment? Level 5 (Walks with assist in room/hall) - Balance while stepping forward/back and can walk in room with assist - Complete Level 5 (Walks with assist in room/hall) - Balance while stepping forward/back and can walk in room with assist - Complete Level 5 (Walks with assist in room/hall) - Balance while stepping forward/back and can walk in room with assist - Complete              Barriers to discharge: none Disposition Plan:  Home HH orders placed: TBD Status is: Inpt  Objective: Blood pressure 133/70, pulse 70, temperature 98 F (36.7 C), temperature source Oral, resp. rate 18, height 6' (1.829 m), weight 70.8 kg, SpO2 98%.  Examination:  Physical Exam Constitutional:      General: He is not in acute distress.    Appearance: Normal appearance.  HENT:     Head: Normocephalic and atraumatic.     Mouth/Throat:     Mouth: Mucous membranes are moist.  Eyes:     Extraocular Movements: Extraocular movements intact.  Cardiovascular:     Rate and Rhythm: Normal rate and regular rhythm.  Pulmonary:     Effort: Pulmonary effort is normal. No respiratory distress.     Breath sounds: Normal breath sounds. No wheezing.  Abdominal:     General: Bowel sounds are decreased. There is no distension.     Palpations: Abdomen is soft.     Tenderness: There is abdominal tenderness (Appropriate from post op).     Comments: Midline surgical incision noted with wound VAC in place  Musculoskeletal:        General: Normal range of motion.     Cervical back: Normal range of motion and neck supple.  Skin:    General: Skin is warm and dry.  Neurological:     General: No focal deficit present.     Mental Status: He is alert.  Psychiatric:        Mood and Affect: Mood normal.        Behavior: Behavior normal.      Consultants:  General surgery  Procedures:  12/01/23: Extended right hemicolectomy with primary anastomosis.    Data Reviewed: Results for orders placed or performed during the hospital encounter of 11/30/23 (from the past 24 hours)  Basic metabolic panel     Status: Abnormal   Collection Time: 12/05/23  6:16 AM  Result Value Ref Range   Sodium 141 135 - 145 mmol/L   Potassium 3.7 3.5 - 5.1 mmol/L   Chloride 109 98 - 111 mmol/L   CO2 18 (L) 22 - 32 mmol/L   Glucose, Bld 74 70 - 99 mg/dL   BUN 25 (H) 8 - 23 mg/dL   Creatinine, Ser 9.18 0.61 - 1.24 mg/dL   Calcium  8.0 (L) 8.9 - 10.3 mg/dL   GFR, Estimated >39 >39 mL/min   Anion gap 14 5 - 15  CBC with Differential/Platelet     Status: Abnormal   Collection Time: 12/05/23  6:16 AM  Result Value Ref Range   WBC 7.4 4.0 -  10.5 K/uL   RBC 4.12 (L) 4.22 - 5.81 MIL/uL   Hemoglobin 11.3 (L) 13.0 - 17.0 g/dL   HCT 64.5 (L) 60.9 - 47.9 %   MCV 85.9 80.0 - 100.0 fL   MCH 27.4 26.0 - 34.0 pg   MCHC 31.9 30.0 - 36.0 g/dL   RDW 84.1 (H) 88.4 - 84.4 %   Platelets 212 150 - 400 K/uL   nRBC 0.0 0.0 - 0.2 %   Neutrophils Relative % 65 %   Neutro Abs 4.8 1.7 - 7.7 K/uL   Lymphocytes Relative 24 %   Lymphs Abs 1.8 0.7 - 4.0 K/uL   Monocytes Relative 7 %   Monocytes Absolute 0.5 0.1 - 1.0 K/uL   Eosinophils Relative 3 %   Eosinophils Absolute 0.2 0.0 - 0.5 K/uL   Basophils Relative 0 %   Basophils Absolute 0.0 0.0 - 0.1 K/uL   WBC Morphology MORPHOLOGY UNREMARKABLE    RBC Morphology MORPHOLOGY UNREMARKABLE    Smear Review Normal platelet morphology    Immature Granulocytes 1 %   Abs Immature Granulocytes 0.08 (H) 0.00 - 0.07 K/uL  Magnesium     Status: None   Collection Time: 12/05/23  6:16 AM  Result Value Ref Range   Magnesium 2.2 1.7 - 2.4 mg/dL  Heparin  level (unfractionated)     Status: None   Collection Time: 12/05/23  6:16 AM  Result Value Ref Range   Heparin  Unfractionated 0.46 0.30 - 0.70 IU/mL    I have reviewed pertinent nursing notes, vitals, labs, and images as necessary. I have ordered labwork to follow up on as indicated.  I  have reviewed the last notes from staff over past 24 hours. I have discussed patient's care plan and test results with nursing staff, CM/SW, and other staff as appropriate.  Time spent: Greater than 50% of the 55 minute visit was spent in counseling/coordination of care for the patient as laid out in the A&P.   LOS: 5 days   Alm Apo, MD [facility] 12/05/2023, 11:01 AM

## 2023-12-05 NOTE — Progress Notes (Signed)
 Initial Nutrition Assessment  DOCUMENTATION CODES:   Not applicable  INTERVENTION:   TPN per pharmacy  Recommend thiamine  100mg  IV daily x 5 days   Pt at high refeed risk; recommend monitor potassium, magnesium and phosphorus labs daily until stable  Daily weights   NUTRITION DIAGNOSIS:   Inadequate oral intake related to altered GI function as evidenced by NPO status.  GOAL:   Patient will meet greater than or equal to 90% of their needs  MONITOR:   Diet advancement, Labs, Weight trends, Skin, I & O's, Other (Comment) (TPN)  REASON FOR ASSESSMENT:   Consult New TPN/TNA  ASSESSMENT:   78 y/o male with h/o DVT/PE, depression, HLD, HTN, HTN, IDA and antithrombin III deficiency who is admitted with LBO s/p extended right hemicolectomy with primary anastomosis 1/1 complicated by post op ileus.  RD working remotely.  Pt with post op ileus. NGT in place with output. No BM or flatus yet. Pt has now been without nutrition for > 5 days. Plan today is for PICC line and TPN today. Pt is at high refeed risk. Per chart, pt is down 10lbs(6%) since June.   Medications reviewed and include: protonix    Labs reviewed: K 3.7 wnl, BUN 25(H), Mg 2.2 wnl  NUTRITION - FOCUSED PHYSICAL EXAM: Unable to perform at this time   Diet Order:   Diet Order             Diet NPO time specified Except for: Ice Chips  Diet effective now                  EDUCATION NEEDS:   No education needs have been identified at this time  Skin:  Skin Assessment: Reviewed RN Assessment (incision abdomen)  Last BM:  12/30  Height:   Ht Readings from Last 1 Encounters:  11/30/23 6' (1.829 m)    Weight:   Wt Readings from Last 1 Encounters:  11/30/23 70.8 kg    Ideal Body Weight:  80.9 kg  BMI:  Body mass index is 21.16 kg/m.  Estimated Nutritional Needs:   Kcal:  2100-2400kcal/day  Protein:  105-120g/day  Fluid:  1.8-2.1L/day  Augustin Shams MS, RD, LDN If unable to be  reached, please send secure chat to RD inpatient available from 8:00a-4:00p daily

## 2023-12-05 NOTE — Progress Notes (Addendum)
 Mobility Specialist - Progress Note    12/05/23 1356  Mobility  Activity Ambulated independently in hallway  Level of Assistance Modified independent, requires aide device or extra time  Assistive Device Front wheel walker  Distance Ambulated (ft) 180 ft  Range of Motion/Exercises Active  Activity Response Tolerated well  Mobility Referral Yes    Pt resting in bed on RA upon entry. RN disconnected NG tube. Pt moves to EOB ModI. Pt STS and ambulates to hallway around NS ModI with RW. Pt returned to bed and left with needs in reach. RN notified that patient is back in bed.   Guido Rumble Mobility Specialist 12/05/23, 2:26 PM

## 2023-12-05 NOTE — Progress Notes (Signed)
 Peripherally Inserted Central Catheter Placement  The IV Nurse has discussed with the patient and/or persons authorized to consent for the patient, the purpose of this procedure and the potential benefits and risks involved with this procedure.  The benefits include less needle sticks, lab draws from the catheter, and the patient may be discharged home with the catheter. Risks include, but not limited to, infection, bleeding, blood clot (thrombus formation), and puncture of an artery; nerve damage and irregular heartbeat and possibility to perform a PICC exchange if needed/ordered by physician.  Alternatives to this procedure were also discussed.  Bard Power PICC patient education guide, fact sheet on infection prevention and patient information card has been provided to patient /or left at bedside.    PICC Placement Documentation  PICC Double Lumen 12/05/23 Right Basilic 41 cm 0 cm (Active)  Indication for Insertion or Continuance of Line Administration of hyperosmolar/irritating solutions (i.e. TPN, Vancomycin, etc.) 12/05/23 1654  Exposed Catheter (cm) 0 cm 12/05/23 1654  Site Assessment Clean, Dry, Intact 12/05/23 1654  Lumen #1 Status Flushed;Saline locked;Blood return noted 12/05/23 1654  Lumen #2 Status Flushed;Saline locked;Blood return noted 12/05/23 1654  Dressing Type Transparent;Securing device 12/05/23 1654  Dressing Status Antimicrobial disc in place;Clean, Dry, Intact 12/05/23 1654  Line Care Connections checked and tightened 12/05/23 1654  Line Adjustment (NICU/IV Team Only) No 12/05/23 1654  Dressing Intervention New dressing;Adhesive placed at insertion site (IV team only);Adhesive placed around edges of dressing (IV team/ICU RN only) 12/05/23 1654  Dressing Change Due 12/12/23 12/05/23 1654       Thomas [name] 12/05/2023, 4:54 PM

## 2023-12-05 NOTE — Plan of Care (Signed)

## 2023-12-05 NOTE — Progress Notes (Signed)
 Patient ID: Thomas [name], male   DOB: Feb 06, 1946, 78 y.o.   MRN: [id number]     SURGICAL PROGRESS NOTE   Hospital Day(s): 5.   Interval History: Patient seen and examined, no acute events or new complaints overnight. Patient reports feeling well.  He denies abdominal pain.  He is still not passing gas.  He feels that the intestines are moving but has not passed any gas or bowel movement.  Abdominal x-ray yesterday after administration of Gastrografin  shows contrast in dilated small intestine.  Air and large intestine no contrast.  Vital signs in last 24 hours: [min-max] current  Temp:  [97.8 F (36.6 C)-98.4 F (36.9 C)] 97.8 F (36.6 C) (01/05 0314) Pulse Rate:  [68-79] 68 (01/05 0314) Resp:  [17] 17 (01/04 1714) BP: (114-124)/(60-68) 124/66 (01/05 0314) SpO2:  [95 %-97 %] 97 % (01/05 0314)     Height: 6' (182.9 cm) Weight: 70.8 kg BMI (Calculated): 21.15   Physical Exam:  Constitutional: alert, cooperative and no distress  Respiratory: breathing non-labored at rest  Cardiovascular: regular rate and sinus rhythm  Gastrointestinal: soft, non-tender, but distended  Labs:     Latest Ref Rng & Units 12/05/2023    6:16 AM 12/04/2023    4:24 AM 12/03/2023    5:56 AM  CBC  WBC 4.0 - 10.5 K/uL 7.4  7.1  6.9   Hemoglobin 13.0 - 17.0 g/dL 88.6  89.0  88.7   Hematocrit 39.0 - 52.0 % 35.4  34.2  35.1   Platelets 150 - 400 K/uL 212  193  175       Latest Ref Rng & Units 12/05/2023    6:16 AM 12/04/2023    4:24 AM 12/03/2023    5:56 AM  CMP  Glucose 70 - 99 mg/dL 74  68  75   BUN 8 - 23 mg/dL 25  23  23    Creatinine 0.61 - 1.24 mg/dL 9.18  9.10  9.14   Sodium 135 - 145 mmol/L 141  140  138   Potassium 3.5 - 5.1 mmol/L 3.7  3.5  3.4   Chloride 98 - 111 mmol/L 109  108  103   CO2 22 - 32 mmol/L 18  18  22    Calcium  8.9 - 10.3 mg/dL 8.0  8.0  7.7     Imaging studies: Abdominal x-ray shows Gastrografin  in small intestine, dilated.  No contrast in large intestine which is also dilated.  This  is consistent with ileus.   Assessment/Plan:  78 y.o. 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   -No clinical alteration.  Stable vital signs -Hemoglobin continued to be stable.  No sign of bleeding.  Continue heparin  drip for adding thrombin 3 deficiency treatment -No leukocytosis, no fever. -Still no GI function return.  Continue NGT, bowel rest.  Gastrografin  in small intestine.  Will repeat x-ray later today. -Start TPN for prolonged ileus -Continue pain management -Continue Prevena in place -Waiting pathology report -Encouraged the patient to ambulate.  Continue PT/OT therapies  Lucas Petrin, MD

## 2023-12-06 ENCOUNTER — Encounter: Payer: Self-pay | Admitting: General Surgery

## 2023-12-06 DIAGNOSIS — C184 Malignant neoplasm of transverse colon: Secondary | ICD-10-CM

## 2023-12-06 DIAGNOSIS — K56609 Unspecified intestinal obstruction, unspecified as to partial versus complete obstruction: Secondary | ICD-10-CM | POA: Diagnosis not present

## 2023-12-06 DIAGNOSIS — Z86718 Personal history of other venous thrombosis and embolism: Secondary | ICD-10-CM | POA: Diagnosis not present

## 2023-12-06 DIAGNOSIS — D6859 Other primary thrombophilia: Secondary | ICD-10-CM | POA: Diagnosis not present

## 2023-12-06 LAB — COMPREHENSIVE METABOLIC PANEL
ALT: 17 U/L (ref 0–44)
AST: 19 U/L (ref 15–41)
Albumin: 2.5 g/dL — ABNORMAL LOW (ref 3.5–5.0)
Alkaline Phosphatase: 41 U/L (ref 38–126)
Anion gap: 9 (ref 5–15)
BUN: 23 mg/dL (ref 8–23)
CO2: 23 mmol/L (ref 22–32)
Calcium: 7.8 mg/dL — ABNORMAL LOW (ref 8.9–10.3)
Chloride: 111 mmol/L (ref 98–111)
Creatinine, Ser: 0.78 mg/dL (ref 0.61–1.24)
GFR, Estimated: >60 mL/min (ref >60)
Glucose, Bld: 166 mg/dL — ABNORMAL HIGH (ref 70–99)
Potassium: 3 mmol/L — ABNORMAL LOW (ref 3.5–5.1)
Sodium: 143 mmol/L (ref 135–145)
Total Bilirubin: 0.5 mg/dL (ref 0.0–1.2)
Total Protein: 5.9 g/dL — ABNORMAL LOW (ref 6.5–8.1)

## 2023-12-06 LAB — CBC WITH DIFFERENTIAL/PLATELET
Abs Immature Granulocytes: 0.16 10*3/uL — ABNORMAL HIGH (ref 0.00–0.07)
Basophils Absolute: 0 10*3/uL (ref 0.0–0.1)
Basophils Relative: 0 %
Eosinophils Absolute: 0.3 10*3/uL (ref 0.0–0.5)
Eosinophils Relative: 4 %
HCT: 33.2 % — ABNORMAL LOW (ref 39.0–52.0)
Hemoglobin: 10.5 g/dL — ABNORMAL LOW (ref 13.0–17.0)
Immature Granulocytes: 2 %
Lymphocytes Relative: 22 %
Lymphs Abs: 1.6 10*3/uL (ref 0.7–4.0)
MCH: 26.7 pg (ref 26.0–34.0)
MCHC: 31.6 g/dL (ref 30.0–36.0)
MCV: 84.5 fL (ref 80.0–100.0)
Monocytes Absolute: 0.5 10*3/uL (ref 0.1–1.0)
Monocytes Relative: 7 %
Neutro Abs: 4.7 10*3/uL (ref 1.7–7.7)
Neutrophils Relative %: 65 %
Platelets: 201 10*3/uL (ref 150–400)
RBC: 3.93 MIL/uL — ABNORMAL LOW (ref 4.22–5.81)
RDW: 15.7 % — ABNORMAL HIGH (ref 11.5–15.5)
Smear Review: NORMAL
WBC: 7.2 10*3/uL (ref 4.0–10.5)
nRBC: 0 % (ref 0.0–0.2)

## 2023-12-06 LAB — GLUCOSE, CAPILLARY
Glucose-Capillary: 134 mg/dL — ABNORMAL HIGH (ref 70–99)
Glucose-Capillary: 134 mg/dL — ABNORMAL HIGH (ref 70–99)
Glucose-Capillary: 142 mg/dL — ABNORMAL HIGH (ref 70–99)
Glucose-Capillary: 147 mg/dL — ABNORMAL HIGH (ref 70–99)
Glucose-Capillary: 151 mg/dL — ABNORMAL HIGH (ref 70–99)

## 2023-12-06 LAB — TRIGLYCERIDES: Triglycerides: 89 mg/dL (ref <150)

## 2023-12-06 LAB — HEPARIN LEVEL (UNFRACTIONATED): Heparin Unfractionated: 0.33 [IU]/mL (ref 0.30–0.70)

## 2023-12-06 LAB — PHOSPHORUS: Phosphorus: 2.3 mg/dL — ABNORMAL LOW (ref 2.5–4.6)

## 2023-12-06 LAB — MAGNESIUM: Magnesium: 2 mg/dL (ref 1.7–2.4)

## 2023-12-06 LAB — SURGICAL PATHOLOGY

## 2023-12-06 MED ORDER — POTASSIUM PHOSPHATES 15 MMOLE/5ML IV SOLN
30.0000 mmol | Freq: Once | INTRAVENOUS | Status: AC
  Administered 2023-12-06: 30 mmol via INTRAVENOUS
  Filled 2023-12-06: qty 10

## 2023-12-06 MED ORDER — TRAVASOL 10 % IV SOLN
INTRAVENOUS | Status: AC
  Filled 2023-12-06: qty 1113.6

## 2023-12-06 MED ORDER — POTASSIUM CHLORIDE 10 MEQ/100ML IV SOLN
10.0000 meq | INTRAVENOUS | Status: AC
  Administered 2023-12-06 (×2): 10 meq via INTRAVENOUS
  Filled 2023-12-06 (×2): qty 100

## 2023-12-06 NOTE — Progress Notes (Signed)
 PHARMACY - ANTICOAGULATION CONSULT NOTE  Pharmacy Consult for heparin  infusion Indication: antithrombin III deficiency  Allergies  Allergen Reactions   Dexamethasone Other (See Comments)    Renal Insufficiency    Patient Measurements: Height: 6' (182.9 cm) Weight: 69.1 kg (152 lb 5.4 oz) IBW/kg (Calculated) : 77.6 Heparin  Dosing Weight: 70.8 kg  Vital Signs: Temp: 97.9 F (36.6 C) (01/06 0523) Temp Source: Oral (01/05 2349) BP: 115/66 (01/06 0523) Pulse Rate: 71 (01/06 0523)  Labs: Recent Labs    12/04/23 0424 12/04/23 0656 12/05/23 0616 12/06/23 0509  HGB 10.9*  --  11.3* 10.5*  HCT 34.2*  --  35.4* 33.2*  PLT 193  --  212 201  LABPROT 21.5*  --   --   --   INR 1.8*  --   --   --   HEPARINUNFRC  --  0.43 0.46 0.33  CREATININE 0.89  --  0.81 0.78    Estimated Creatinine Clearance: 75.6 mL/min (by C-G formula based on SCr of 0.78 mg/dL).   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  He is on warfarin prior to admission (last INR 1.8) and is being placed on IV heparin  until warfarin can be resumed  Goal of Therapy:  Heparin  level 0.3-0.7 units/ml Monitor platelets by anticoagulation protocol: Yes   Plan:  1/6:  HL @ 0509 = 0.33, therapeutic X 4  - will continue pt on current rate and recheck HL on 1/7 with AM labs - Continue to monitor H&H and  platelets  Letty Salvi D 12/06/2023,6:23 AM

## 2023-12-06 NOTE — Consult Note (Signed)
 Hematology/Oncology Consult note [phone] [phone]      Patient Care Team: Melvin Pao, NP as PCP - General Jennine Dancer, MD as Referring Physician (Plastic Surgery) Gregorio Adine MATSU, MD as Referring Physician (Dermatology)   Name of the patient: Thomas [name]  [id number]  11/02/1946   REASON FOR COSULTATION:  Colon cancer History of presenting illness-  78 y.o. male with PMH listed at below who presents to ER for evaluation of abdominal pain and vomiting.   12 31st 2024, CT abdomen pelvis showed  1. Apple-core lesion within the proximal transverse colon measuring 4.3 cm in length, consistent with colonic malignancy. 2. High-grade bowel obstruction as result of the transverse colon mass, with marked distension of the small bowel and proximal colon.Surgical consultation recommended. 3. Cholelithiasis and gallbladder sludge. No evidence of acute cholecystitis. 4. Indeterminate bilateral adrenal masses, probably benign adenomas.Recommend follow-up adrenal washout CT in 1 year. If stable for = 1 year, no further follow-up imaging. 5. Tree in bud ground-glass airspace disease throughout the visualized right lung, which may reflect aspiration, infection, or inflammation. 6. Small hiatal hernia. 7. Minimal pelvic ascites. 8. Bilateral hydroceles, right larger than left. 9. Enlarged prostate. 10.  Aortic Atherosclerosis (ICD10-I70.0)  Patient was evaluated by surgery. 12/01/2023, patient's status post extended right hemicolectomy. 1. Colon, segmental resection, right colon :      - INVASIVE COLORECTAL ADENOCARCINOMA, MULTIFOCAL, INVOLVING THE ASCENDING COLON      AND CECUM.      - SEE CANCER SUMMARY BELOW.      - COLONIC DILATION SECONDARY TO TUMOR OBSTRUCTION.      - APPENDIX WITH OBLITERATION OF THE DISTAL LUMEN.       Diagnosis Note : This case underwent intradepartmental consultation and Dr.      Kin concurs with the interpretation.  These findings were communicated to Dr.      Rodolph on 12/06/2023.  TUMOR Multiple Primary Sites: Present: Ascending colon and cecum Tumor Site: Ascending colon Histologic Type: Adenocarcinoma Histologic Grade: G2, moderately differentiated Tumor Size: Greatest dimension: 7.0 cm Tumor Extent: Invades through the muscularis propria into pericolonic tissue Sub-mucosal Invasion: Not applicable (not a pT1 tumor) Macroscopic Tumor Perforation: Not identified Lymphatic and/or Vascular Invasion: Not identified Perineural Invasion: Present Tumor Budding Score: High (10 or more) Treatment Effect: No known presurgical therapy  Tumor Site: Cecum Histologic Type: Adenocarcinoma Histologic Grade: G2, moderately differentiated Tumor Size: Greatest dimension: 2.5 cm Tumor Extent: Invades through the muscularis propria into pericolonic tissue Sub-mucosal Invasion: Not applicable (not a pT1 tumor) Macroscopic Tumor Perforation: Not identified Lymphatic and/or Vascular Invasion: Not identified Perineural Invasion: Not identified Tumor Budding Score: High (10 or more) Treatment Effect: No known presurgical therapy  MARGINS Margin Status for Invasive Carcinoma: All margins negative for invasive carcinoma Margins examined: Proximal, distal Margin Status for Non-Invasive Tumor: All margins negative for high-grade dysplasia/intramucosal carcinoma and low-grade dysplasia  REGIONAL LYMPH NODES Regional Lymph Nodes: All regional lymph nodes negative for tumor Number of Lymph Nodes Examined: 13 Tumor Deposits: 0  DISTANT METASTASES Distant Site(s) Involved, if applicable: Not applicable  PATHOLOGIC STAGE CLASSIFICATION (pTNM, AJCC 8th Edition):   Modified Classification: Not applicable pT3 T suffix: (m) multiple primary synchronous tumors in a single organ pN0 pM - Not applicable   MMR is pending.   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  Patient reports that diagnosis was made in  2012 after his left occlusive lower extremity DVT. He presented with INR of 9.4 this admission. Currently patient is on heparin  drip.  He lives by himself.  Denies family history of colon cancer.  Sister was diagnosed with cancer, which he does not know details.  Allergies  Allergen Reactions   Dexamethasone Other (See Comments)    Renal Insufficiency    Patient Active Problem List   Diagnosis Date Noted   Large bowel obstruction (HCC) 11/30/2023   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   Nausea and vomiting 11/30/2023   Advanced care planning/counseling discussion 11/02/2023   IDA (iron deficiency anemia) 04/14/2023   Anemia 09/01/2022   Encounter for current long-term use of anticoagulants 01/28/2021   Lymphocytosis 11/25/2020   Impaired fasting glucose 04/03/2020   History of basal cell carcinoma (BCC) excision 09/09/2018   Antithrombin 3 deficiency (HCC) 05/03/2015   Airway hyperreactivity 05/03/2015   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   H/O deep venous thrombosis 05/03/2015   Hyperlipidemia 05/03/2015   Essential hypertension 05/03/2015   History of pulmonary embolism 05/03/2015   Allergic rhinitis 03/21/2012     Past Medical History:  Diagnosis Date   Clotting disorder (HCC)    Hyperlipidemia    Hypertension    Sinus infection    Tinnitus      Past Surgical History:  Procedure Laterality Date   CATARACT EXTRACTION Bilateral 1998, 2007   COLECTOMY Right 12/01/2023   Procedure: RIGHT COLECTOMY;  Surgeon: Rodolph Romano, MD;  Location: [facility] ORS;  Service: General;  Laterality: Right;   MOHS SURGERY Left 09/08/2018   Mohs surgery Ear Dr.  Adine Rakers [facility], Dr. Jennine   TONSILLECTOMY  1956    Social History   Socioeconomic History   Marital status: Single    Spouse name: Not on file   Number of children: 0    Years of education: Not on file   Highest education level: Not on file  Occupational History   Occupation: Retried  Tobacco Use   Smoking status: Former    Current packs/day: 0.00    Types: Cigarettes    Quit date: 2002    Years since quitting: 23.0   Smokeless tobacco: Never   Tobacco comments:    started smokng at age 8  Vaping Use   Vaping status: Never Used  Substance and Sexual Activity   Alcohol use: Not Currently    Comment: no etoh in 12 years   Drug use: Never   Sexual activity: Not Currently  Other Topics Concern   Not on file  Social History Narrative   Not on file   Social Drivers of Health   Financial Resource Strain: Low Risk  (01/31/2021)   Overall Financial Resource Strain (CARDIA)    Difficulty of Paying Living Expenses: Not hard at all  Food Insecurity: No Food Insecurity (12/01/2023)   Hunger Vital Sign    Worried About Running Out of Food in the Last Year: Never true    Ran Out of Food in the Last Year: Never true  Transportation Needs: No Transportation Needs (12/01/2023)   PRAPARE - Administrator, Civil Service (Medical): No    Lack of Transportation (Non-Medical): No  Physical Activity: Inactive (01/31/2021)   Exercise Vital Sign    Days of Exercise per Week: 0 days    Minutes of Exercise per Session: 0 min  Stress: No Stress Concern Present (01/31/2021)   [employer] of Occupational Health - Occupational Stress Questionnaire  Feeling of Stress : Not at all  Social Connections: Socially Isolated (12/04/2023)   Social Connection and Isolation Panel [NHANES]    Frequency of Communication with Friends and Family: Once a week    Frequency of Social Gatherings with Friends and Family: Once a week    Attends Religious Services: Never    Database Administrator or Organizations: No    Attends Banker Meetings: Never    Marital Status: Never married  Intimate Partner Violence: Not At Risk (12/01/2023)   Humiliation, Afraid, Rape,  and Kick questionnaire    Fear of Current or Ex-Partner: No    Emotionally Abused: No    Physically Abused: No    Sexually Abused: No     Family History  Problem Relation Age of Onset   Alzheimer's disease Mother    Heart attack Mother    Heart attack Father    Cancer Sister    Diabetes Sister    Diabetes Sister    Alzheimer's disease Brother      Current Facility-Administered Medications:    Chlorhexidine  Gluconate Cloth 2 % PADS 6 each, 6 each, Topical, Daily, Girguis, David, MD, 6 each at 12/06/23 1138   heparin  ADULT infusion 100 units/mL (25000 units/250mL), 1,550 Units/hr, Intravenous, Continuous, Nada Adriana BIRCH, RPH, Last Rate: 15.5 mL/hr at 12/06/23 1939, 1,550 Units/hr at 12/06/23 1939   hydrALAZINE  (APRESOLINE ) injection 10 mg, 10 mg, Intravenous, Q4H PRN, Patsy Lenis, MD   insulin  aspart (novoLOG ) injection 0-9 Units, 0-9 Units, Subcutaneous, Q6H, Nazari, Walid A, RPH, 1 Units at 12/06/23 8177   labetalol  (NORMODYNE ) injection 10 mg, 10 mg, Intravenous, Q4H PRN, Patsy Lenis, MD   pantoprazole  (PROTONIX ) injection 40 mg, 40 mg, Intravenous, Daily, Cintron-Diaz, Edgardo, MD, 40 mg at 12/06/23 1112   sodium chloride  flush (NS) 0.9 % injection 10-40 mL, 10-40 mL, Intracatheter, Q12H, Cintron-Diaz, Edgardo, MD, 10 mL at 12/06/23 2108   sodium chloride  flush (NS) 0.9 % injection 10-40 mL, 10-40 mL, Intracatheter, PRN, Rodolph Romano, MD   TPN ADULT (ION), , Intravenous, Continuous TPN, Nazari, Walid A, RPH, Last Rate: 80 mL/hr at 12/06/23 1828, New Bag at 12/06/23 1828  Review of Systems  Constitutional:  Negative for chills, fatigue and fever.  HENT:   Negative for hearing loss and voice change.   Eyes:  Negative for eye problems and icterus.  Respiratory:  Negative for chest tightness, cough and shortness of breath.   Cardiovascular:  Negative for chest pain and leg swelling.  Gastrointestinal:  Negative for abdominal distention and abdominal pain.   Endocrine: Negative for hot flashes.  Genitourinary:  Negative for difficulty urinating, dysuria and frequency.   Musculoskeletal:  Negative for arthralgias.  Skin:  Negative for itching and rash.  Neurological:  Negative for light-headedness and numbness.  Hematological:  Negative for adenopathy. Does not bruise/bleed easily.  Psychiatric/Behavioral:  Negative for confusion.     PHYSICAL EXAM Vitals:   12/06/23 0523 12/06/23 0937 12/06/23 1706 12/06/23 2017  BP: 115/66 (!) 111/99 129/69 113/65  Pulse: 71 81 92 84  Resp: 20 18 20 18   Temp: 97.9 F (36.6 C) 97.8 F (36.6 C) 97.9 F (36.6 C) 98.2 F (36.8 C)  TempSrc:  Oral Oral Oral  SpO2: 93% 96% 97% 94%  Weight:      Height:       Physical Exam Constitutional:      General: He is not in acute distress.    Appearance: He is not diaphoretic.  HENT:  Head: Normocephalic and atraumatic.     Nose:     Comments: NG tube in place    Mouth/Throat:     Pharynx: No oropharyngeal exudate.  Eyes:     General: No scleral icterus. Cardiovascular:     Rate and Rhythm: Normal rate.     Heart sounds: No murmur heard. Pulmonary:     Effort: Pulmonary effort is normal. No respiratory distress.  Abdominal:     General: There is distension.     Palpations: Abdomen is soft.     Tenderness: There is no abdominal tenderness.     Comments: Midline surgical incision noted  Musculoskeletal:        General: Normal range of motion.     Cervical back: Normal range of motion and neck supple.  Skin:    General: Skin is warm and dry.  Neurological:     Mental Status: He is alert and oriented to person, place, and time. Mental status is at baseline.     Motor: No abnormal muscle tone.  Psychiatric:        Mood and Affect: Mood and affect normal.       LABORATORY STUDIES    Latest Ref Rng & Units 12/06/2023    5:09 AM 12/05/2023    6:16 AM 12/04/2023    4:24 AM  CBC  WBC 4.0 - 10.5 K/uL 7.2  7.4  7.1   Hemoglobin 13.0 - 17.0 g/dL  89.4  88.6  89.0   Hematocrit 39.0 - 52.0 % 33.2  35.4  34.2   Platelets 150 - 400 K/uL 201  212  193       Latest Ref Rng & Units 12/06/2023    5:09 AM 12/05/2023    6:16 AM 12/04/2023    4:24 AM  CMP  Glucose 70 - 99 mg/dL 833  74  68   BUN 8 - 23 mg/dL 23  25  23    Creatinine 0.61 - 1.24 mg/dL 9.21  9.18  9.10   Sodium 135 - 145 mmol/L 143  141  140   Potassium 3.5 - 5.1 mmol/L 3.0  3.7  3.5   Chloride 98 - 111 mmol/L 111  109  108   CO2 22 - 32 mmol/L 23  18  18    Calcium  8.9 - 10.3 mg/dL 7.8  8.0  8.0   Total Protein 6.5 - 8.1 g/dL 5.9     Total Bilirubin 0.0 - 1.2 mg/dL 0.5     Alkaline Phos 38 - 126 U/L 41     AST 15 - 41 U/L 19     ALT 0 - 44 U/L 17        RADIOGRAPHIC STUDIES: I have personally reviewed the radiological images as listed and agreed with the findings in the report. DG Abd 2 Views Result Date: 12/05/2023 CLINICAL DATA:  Ileus following abdominal surgery. EXAM: ABDOMEN - 2 VIEW COMPARISON:  Abdominal radiograph dated 12/04/2023. FINDINGS: Enteric contrast overlies the rectum. Multiple dilated loops of bowel are seen throughout the abdomen, similar in extent to prior exam. An enteric tube tip overlies the stomach with the side port at the gastroesophageal junction. IMPRESSION: Enteric contrast overlies the rectum. Multiple dilated loops of bowel throughout the abdomen, similar in extent to prior exam. These findings likely reflect ileus. Electronically Signed   By: Norman Hopper M.D.   On: 12/05/2023 17:56   US  EKG SITE RITE Result Date: 12/05/2023 If Site Rite image not attached, placement could  not be confirmed due to current cardiac rhythm.  DG Abd Portable 1V-Small Bowel Obstruction Protocol-initial, 8 hr delay Result Date: 12/04/2023 CLINICAL DATA:  8 hour small-bowel follow-through EXAM: PORTABLE ABDOMEN - 1 VIEW COMPARISON:  12/03/2023 FINDINGS: Gastric catheter is noted within the stomach. Administered contrast lies predominately within dilated loops of small  bowel. No colonic contrast is seen at this time. No free air is noted. IMPRESSION: Administered contrast lies in the dilated small bowel. 24 hour follow-up film is recommended. Electronically Signed   By: Oneil Devonshire M.D.   On: 12/04/2023 20:47   DG Abd 1 View Result Date: 12/03/2023 CLINICAL DATA:  Ileus.  Status post GI surgery. EXAM: ABDOMEN - 1 VIEW COMPARISON:  X-ray 11/30/2023 and CT scan FINDINGS: Enteric tube in place with the tip beneath the diaphragm but side hole at the GE junction. Recommend this be advanced further into the stomach. Midline skin staples are seen along the abdomen and pelvis. There is some air towards the rectum along the colon. However there are some distended air-filled loops of bowel in the upper abdomen including what may be small bowel which are dilated up to 4 cm. Patient is postop in this very well could be ileus rather than a partial obstruction. Recommend continued follow up. Degenerative changes of the spine and pelvis. IMPRESSION: Postop changes. Dilated loops small bowel in the upper abdomen as well as air in distended colon. This could very well could be ileus rather than obstruction with patient being postop. Recommend continued follow-up. NG tube in place but side hole above the diaphragm. This could be advanced further into the stomach. Electronically Signed   By: Ranell Bring M.D.   On: 12/03/2023 18:15   DG Abd Portable 1 View Result Date: 12/01/2023 CLINICAL DATA:  ng tube EXAM: PORTABLE ABDOMEN - 1 VIEW COMPARISON:  CT abdomen pelvis 11/30/2023 FINDINGS: Enteric tube courses inferiorly along the mediastinum with a hairpin loop at the lower mediastinum and tip terminating cranially at the level of the clavicles. The heart and mediastinal contours are within normal limits. No focal consolidation. Coarsened interstitial markings. No pleural effusion. No pneumothorax. Multiple loops of small bowel dilated with gas. No radio-opaque calculi or other significant  radiographic abnormality are seen. Aortic calcification. IMPRESSION: 1. Malpositioned enteric tube.  Recommend removal and replacement. 2. Multiple loops of small bowel dilated with gas. 3. Coarsened interstitial markings. Electronically Signed   By: Morgane  Naveau M.D.   On: 12/01/2023 03:34   CT ABDOMEN PELVIS W CONTRAST Result Date: 11/30/2023 CLINICAL DATA:  Abdominal pain for 2 days, vomiting yesterday EXAM: CT ABDOMEN AND PELVIS WITH CONTRAST TECHNIQUE: Multidetector CT imaging of the abdomen and pelvis was performed using the standard protocol following bolus administration of intravenous contrast. RADIATION DOSE REDUCTION: This exam was performed according to the departmental dose-optimization program which includes automated exposure control, adjustment of the mA and/or kV according to patient size and/or use of iterative reconstruction technique. CONTRAST:  OMNIPAQUE  IOHEXOL  300 MG/ML  SOLN COMPARISON:  None Available. FINDINGS: Lower chest: Diffuse tree in bud ground-glass airspace disease throughout the right middle and right lower lobe may reflect inflammation, infection, or sequela of aspiration given history of vomiting. Small hiatal hernia. Hepatobiliary: Cholelithiasis and gallbladder sludge without evidence of acute cholecystitis. The liver is unremarkable. No biliary duct dilation. Pancreas: Unremarkable. No pancreatic ductal dilatation or surrounding inflammatory changes. Spleen: Normal in size without focal abnormality. Adrenals/Urinary Tract: 4 mm nonobstructing calculus upper pole left kidney. 2 mm  nonobstructing calculus lower pole right kidney. Bilateral simple renal cortical cysts do not require imaging follow-up. There are bilateral indeterminate adrenal nodules, measuring 1 cm on the right with attenuation of 115 HU, measuring 0.9 cm on the left with attenuation 98 HU. Bladder is unremarkable. Stomach/Bowel: There is an apple-core lesion within the proximal transverse colon  measuring 4.3 cm in length consistent with colonic malignancy. There is functional obstruction as result of this mass, with marked distension of the ascending colon and small bowel. Numerous gas fluid levels are seen throughout the dilated small bowel and proximal colon. Normal appendix central upper abdomen. No acute inflammatory changes. Vascular/Lymphatic: Aortic atherosclerosis. No enlarged abdominal or pelvic lymph nodes. Reproductive: Prostate is enlarged measuring 5.7 x 4.7 cm, with central calcified concretions. There are bilateral hydroceles, large on the right and small on the left, incompletely evaluated. Other: Small volume ascites within the lower pelvis. No free intraperitoneal gas. No abdominal wall hernia. Musculoskeletal: No acute or destructive bony abnormalities. Reconstructed images demonstrate no additional findings. IMPRESSION: 1. Apple-core lesion within the proximal transverse colon measuring 4.3 cm in length, consistent with colonic malignancy. 2. High-grade bowel obstruction as result of the transverse colon mass, with marked distension of the small bowel and proximal colon. Surgical consultation recommended. 3. Cholelithiasis and gallbladder sludge. No evidence of acute cholecystitis. 4. Indeterminate bilateral adrenal masses, probably benign adenomas. Recommend follow-up adrenal washout CT in 1 year. If stable for = 1 year, no further follow-up imaging. JACR 2017 Aug; 14(8):1038-44, JCAT 2016 Mar-Apr; 40(2):194-200, Urol J 2006 Spring; 3(2):71-4. 5. Tree in bud ground-glass airspace disease throughout the visualized right lung, which may reflect aspiration, infection, or inflammation. 6. Small hiatal hernia. 7. Minimal pelvic ascites. 8. Bilateral hydroceles, right larger than left. 9. Enlarged prostate. 10.  Aortic Atherosclerosis (ICD10-I70.0). Electronically Signed   By: Ozell Daring M.D.   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 I will obtain outpatient CT chest to complete staging. Patient does have more than 1 high risk factors-.  Perineural invasion, clinical obstruction.  High tumor budding  -If pMMR, could consider adjuvant chemotherapy. Currently MMR is pending.  # Prolonged ileus, continue supportive care.  Managed by surgery. # Normocytic anemia, check iron level Hemoglobin stable.  # History of Antithrombin III deficiency-diagnosed remotely. Currently on heparin  drip for anticoagulation. Once his situation is stabilized and no additional intervention is needed, he may switch to Lovenox  bridging to Coumadin .  Plan to check Antithrombin III level while he is on Coumadin ., as heparin  affects Antithrombin III level. DOAC can be considered as alternative as outpatient anticoagulation if insurance covers. DOAC increases AT level  Thank you for allowing me to participate in the care of this patient.   Zelphia Cap, MD, PhD Hematology Oncology 12/06/2023

## 2023-12-06 NOTE — Plan of Care (Signed)
  Problem: Nutrition: Goal: Adequate nutrition will be maintained Outcome: Progressing   Problem: Coping: Goal: Level of anxiety will decrease Outcome: Progressing   Problem: Pain Management: Goal: General experience of comfort will improve Outcome: Progressing   Problem: Safety: Goal: Ability to remain free from injury will improve Outcome: Progressing   Problem: Skin Integrity: Goal: Risk for impaired skin integrity will decrease Outcome: Progressing

## 2023-12-06 NOTE — Progress Notes (Signed)
 PT Cancellation Note  Patient Details Name: Thomas [name] MRN: [id number] DOB: 02-May-1946   Cancelled Treatment:    Reason Eval/Treat Not Completed: Other (comment) (on first attempt to see, pt working with OT. On 2nd attempt, pt still sleeping heavily. WIll attempt again at later date/time.)   Heidi Lemay C 12/06/2023, 3:21 PM

## 2023-12-06 NOTE — Progress Notes (Signed)
 Patient ID: Thomas [name], male   DOB: 24-Dec-1945, 78 y.o.   MRN: [id number]     SURGICAL PROGRESS NOTE   Hospital Day(s): 6.   Interval History: Patient seen and examined, no acute events or new complaints overnight. Patient reports feeling well but still cannot pass gas. No abdominal pain. Feels tired. No nausea  Vital signs in last 24 hours: [min-max] current  Temp:  [97.8 F (36.6 C)-97.9 F (36.6 C)] 97.8 F (36.6 C) (01/06 0937) Pulse Rate:  [69-81] 81 (01/06 0937) Resp:  [18-20] 18 (01/06 0937) BP: (111-136)/(66-99) 111/99 (01/06 0937) SpO2:  [93 %-96 %] 96 % (01/06 0937) Weight:  [69.1 kg] 69.1 kg (01/06 0500)     Height: 6' (182.9 cm) Weight: 69.1 kg BMI (Calculated): 20.66   Physical Exam:  Constitutional: alert, cooperative and no distress  Respiratory: breathing non-labored at rest  Cardiovascular: regular rate and sinus rhythm  Gastrointestinal: soft, non-tender, and mild-distended  Labs:     Latest Ref Rng & Units 12/06/2023    5:09 AM 12/05/2023    6:16 AM 12/04/2023    4:24 AM  CBC  WBC 4.0 - 10.5 K/uL 7.2  7.4  7.1   Hemoglobin 13.0 - 17.0 g/dL 89.4  88.6  89.0   Hematocrit 39.0 - 52.0 % 33.2  35.4  34.2   Platelets 150 - 400 K/uL 201  212  193       Latest Ref Rng & Units 12/06/2023    5:09 AM 12/05/2023    6:16 AM 12/04/2023    4:24 AM  CMP  Glucose 70 - 99 mg/dL 833  74  68   BUN 8 - 23 mg/dL 23  25  23    Creatinine 0.61 - 1.24 mg/dL 9.21  9.18  9.10   Sodium 135 - 145 mmol/L 143  141  140   Potassium 3.5 - 5.1 mmol/L 3.0  3.7  3.5   Chloride 98 - 111 mmol/L 111  109  108   CO2 22 - 32 mmol/L 23  18  18    Calcium  8.9 - 10.3 mg/dL 7.8  8.0  8.0   Total Protein 6.5 - 8.1 g/dL 5.9     Total Bilirubin 0.0 - 1.2 mg/dL 0.5     Alkaline Phos 38 - 126 U/L 41     AST 15 - 41 U/L 19     ALT 0 - 44 U/L 17       Imaging studies: Abdominal xray shows small bowel dilation but contrast moved to the large intestine.   Assessment/Plan:  78 y.o. male with mass of  the transverse colon with obstruction 5 Day Post-Op s/p extended right hemicolectomy, complicated by pertinent comorbidities including Antithrombin III deficiency.   -Continue slow recovery but no clinical toleration. -Hemoglobin continued to be stable.  No sign of bleeding.  Continue heparin  drip for adding thrombin 3 deficiency treatment. -No leukocytosis, no fever. -Still no GI function return.  Continue NGT, bowel rest.  Gastrografin  seen in the distal large intestine.  Good sign diet and hopefully patient will start having GI function soon.  Will clamp NGT and let patient getting black coffee. -Continue TPN for prolonged ileus -Continue pain management -Continue Prevena in place -Hematology/oncology service consulted for management of malignant neoplasm of the large intestine and Antithrombin III deficiency. -Encouraged the patient to ambulate.  Continue PT/OT therapies  Lucas Petrin, MD

## 2023-12-06 NOTE — Progress Notes (Signed)
 Progress Note   Patient: Thomas [name] FMW:[id number] DOB: August 30, 1946 DOA: 11/30/2023     6 DOS: the patient was seen and examined on 12/06/2023    Hospital Course: 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 He had not been eating well at home before multiple days due to worsening abdominal distention and pain.  He had been still taking Coumadin .   CT abdomen/pelvis showed apple core lesion within the proximal transverse colon measuring 4.3 cm in length concerning for colonic malignancy and high-grade bowel obstruction due to this. General surgery was consulted and patient was admitted for further workup.   INR was markedly elevated, 9.4 on admission and he underwent reversal with vitamin K  and FFP. He then underwent right hemicolectomy with primary anastomosis with general surgery on 12/01/2023.     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    Appearance: Normal appearance.  HENT:     Head: Normocephalic and atraumatic.     Mouth/Throat:     Mouth: Mucous membranes are moist.  Eyes:     Extraocular Movements: Extraocular movements intact.  Cardiovascular:     Rate and Rhythm: Normal rate and regular rhythm.  Pulmonary:     Effort: Pulmonary effort is normal. No respiratory distress.     Breath sounds: Normal breath sounds. No wheezing.  Abdominal: Peri-incisional site tenderness    Comments: Midline surgical incision noted with wound VAC in place  Musculoskeletal:        General: Normal range of motion.     Cervical back: Normal range of motion and neck supple.  Skin:    General: Skin is warm and dry.  Neurological:     General: No focal deficit present.     Mental Status: He is alert.  Psychiatric:        Mood and Affect: Mood normal.        Behavior: Behavior  normal.    Subjective: Patient seen and examined at bedside this morning Still on heparin  drip NG tube still in place Wound VAC connected to the midline incisional wound TPN still running Denied worsening abdominal pain chest pain or cough   Data reviewed:  Vitals:   12/05/23 2349 12/06/23 0500 12/06/23 0523 12/06/23 0937  BP: 121/67  115/66 (!) 111/99  Pulse: 69  71 81  Resp: 20  20 18   Temp: 97.9 F (36.6 C)  97.9 F (36.6 C) 97.8 F (36.6 C)  TempSrc: Oral   Oral   SpO2: 93%  93% 96%  Weight:  69.1 kg    Height:          Latest Ref Rng & Units 12/06/2023    5:09 AM 12/05/2023    6:16 AM 12/04/2023    4:24 AM  BMP  Glucose 70 - 99 mg/dL 833  74  68   BUN 8 - 23 mg/dL 23  25  23    Creatinine 0.61 - 1.24 mg/dL 9.21  9.18  9.10   Sodium 135 - 145 mmol/L 143  141  140   Potassium 3.5 - 5.1 mmol/L 3.0  3.7  3.5   Chloride 98 - 111 mmol/L 111  109  108   CO2 22 - 32 mmol/L 23  18  18    Calcium  8.9 - 10.3 mg/dL 7.8  8.0  8.0        Latest Ref Rng & Units 12/06/2023    5:09 AM 12/05/2023    6:16 AM 12/04/2023    4:24 AM  CBC  WBC 4.0 - 10.5 K/uL 7.2  7.4  7.1   Hemoglobin 13.0 - 17.0 g/dL 89.4  88.6  89.0   Hematocrit 39.0 - 52.0 % 33.2  35.4  34.2   Platelets 150 - 400 K/uL 201  212  193      Author: Drue ONEIDA Potter, MD 12/06/2023 5:01 PM  For on call review [email or web address].

## 2023-12-06 NOTE — Plan of Care (Signed)

## 2023-12-06 NOTE — Progress Notes (Signed)
 Occupational Therapy Treatment Patient Details Name: Thomas [name] MRN: [id number] DOB: 1946-06-29 Today's Date: 12/06/2023   History of present illness Pt is a 78 y/o M admitted on 11/30/23 after presenting with c/o abdominal pain & vomiting. CT of abdomen/pelvis concerning for colonic malignancy & high-grade bowel obstruction.  Pt underwent extended R hemicolectomy with primary anastomosis on 11/30/22. PMH: HTN, DVT, Iron deficiency anemia   OT comments  Pt is supine in bed on arrival. Easily arousable and agreeable to OT session. He reports 2/10 pain to his abdomen described as soreness following movement. Pt performed bed mobility MOD I, STS from EOB to RW with CGA and extra time, then able to ambulate with SUP using RW to stand at sink and perform oral care. SUP provided while pt performed oral care and then he sat at EOB to perform LB dressing to don shoes and mesh panties with SUP.  Pt returned to bed with all needs in place and will cont to require skilled acute OT services to maximize his safety and IND to return to PLOF.       If plan is discharge home, recommend the following:  A little help with walking and/or transfers;A little help with bathing/dressing/bathroom;Assistance with cooking/housework;Assist for transportation;Help with stairs or ramp for entrance   Equipment Recommendations  BSC/3in1    Recommendations for Other Services      Precautions / Restrictions Precautions Precautions: Fall Precaution Comments: abdominal wound vac Restrictions Weight Bearing Restrictions Per Provider Order: No       Mobility Bed Mobility Overal bed mobility: Needs Assistance Bed Mobility: Sidelying to Sit, Sit to Sidelying   Sidelying to sit: Modified independent (Device/Increase time), Used rails, HOB elevated     Sit to sidelying: Modified independent (Device/Increase time) General bed mobility comments: declined getting to recliner    Transfers Overall transfer level:  Needs assistance Equipment used: Rolling walker (2 wheels) Transfers: Sit to/from Stand             General transfer comment: CGA/extra time for STS from EOB to RW then SUP for mobility to the sink and dynamic standing balance     Balance Overall balance assessment: Needs assistance   Sitting balance-Leahy Scale: Good     Standing balance support: During functional activity, Bilateral upper extremity supported, Reliant on assistive device for balance Standing balance-Leahy Scale: Good                             ADL either performed or assessed with clinical judgement   ADL Overall ADL's : Needs assistance/impaired     Grooming: Oral care;Standing;Supervision/safety               Lower Body Dressing: Supervision/safety;Sitting/lateral leans                 General ADL Comments: SUP for standing at sink to perform oral care and SUP seated EOB to don mesh panties and shoes    Extremity/Trunk Assessment              Vision       Perception     Praxis      Cognition Arousal: Alert Behavior During Therapy: WFL for tasks assessed/performed Overall Cognitive Status: Within Functional Limits for tasks assessed  General Comments: pleasant, coversational t/o session.        Exercises      Shoulder Instructions       General Comments wound vac, IVs and NG tube intact pre/post session; NG tube clamped to get OOB    Pertinent Vitals/ Pain       Pain Assessment Pain Assessment: 0-10 Pain Score: 2  Pain Location: abdomen with movement Pain Descriptors / Indicators: Discomfort, Sore Pain Intervention(s): Monitored during session, Repositioned  Home Living                                          Prior Functioning/Environment              Frequency  Min 1X/week        Progress Toward Goals  OT Goals(current goals can now be found in the care plan  section)  Progress towards OT goals: Progressing toward goals  Acute Rehab OT Goals Patient Stated Goal: improve pain OT Goal Formulation: With patient Time For Goal Achievement: 12/16/23 Potential to Achieve Goals: Good  Plan      Co-evaluation                 AM-PAC OT 6 Clicks Daily Activity     Outcome Measure   Help from another person eating meals?: A Little (d.t NPO) Help from another person taking care of personal grooming?: None Help from another person toileting, which includes using toliet, bedpan, or urinal?: A Little Help from another person bathing (including washing, rinsing, drying)?: A Little Help from another person to put on and taking off regular upper body clothing?: None Help from another person to put on and taking off regular lower body clothing?: A Little 6 Click Score: 20    End of Session Equipment Utilized During Treatment: Rolling walker (2 wheels)  OT Visit Diagnosis: Other abnormalities of gait and mobility (R26.89);Muscle weakness (generalized) (M62.81);Pain Pain - part of body:  (abdomen)   Activity Tolerance Patient tolerated treatment well   Patient Left with call bell/phone within reach;in bed   Nurse Communication          Time: 9054-8993 OT Time Calculation (min): 21 min  Charges: OT General Charges $OT Visit: 1 Visit OT Treatments $Self Care/Home Management : 8-22 mins  Adisa Vigeant, OTR/L  12/06/23, 10:45 AM   Duwaine FORBES Saupe 12/06/2023, 10:42 AM

## 2023-12-06 NOTE — Consult Note (Signed)
 PHARMACY - TOTAL PARENTERAL NUTRITION CONSULT NOTE   Indication: Prolonged ileus  Patient Measurements: Height: 6' (182.9 cm) Weight: 69.1 kg (152 lb 5.4 oz) IBW/kg (Calculated) : 77.6 TPN AdjBW (KG): 70.8 Body mass index is 20.66 kg/m. Usual Weight: 70.8 kg   Assessment: Thomas [name] is a 78 year old male that presented to the ED with abdominal pain and vomiting. CT abdomen/pelvis showed apple core lesion within the proximal transverse colon measuring 4.3 cm in length concerning for colonic malignancy and high-grade bowel obstruction due to this. General surgery was consulted and have recommended patient start TPN for a prolonged ileus.   Glucose / Insulin : sensitive SSI Q6H Insulin  need last 24h: 3 units Electrolytes: K 3.0, Phos 2.3, Mag 2.0 Renal: SCr 0.78 Hepatic: AST/ALT WNL Intake / Output; MIVF:  GI Imaging: CT abdomen pelvis concerning for colonic malignancy and high-grade bowel obstruction GI Surgeries / Procedures:  1/2: extended right hemicolectomy   Central access: PICC line has been ordered  TPN start date: 12/05/2023   Nutritional Goals:  Current compounded TPN provides 111 gm of protein, 2,000 Kcal, 2L   RD Assessment: Estimated Needs Total Energy Estimated Needs: 2100-2400kcal/day Total Protein Estimated Needs: 105-120g/day Total Fluid Estimated Needs: 1.8-2.1L/day  Current Nutrition: NPO  Plan:  Increase TPN to goal rate of 32ml/hr Electrolytes in TPN: Na 95mEq/L, K 55mEq/L, Ca 49mEq/L, Mg 30mEq/L, and Phos 15mmol/L. Cl:Ac 1:1 Add standard MVI and trace elements to TPN  Thiamine  100 mg daily added to TPN per dietician recommendations(Day 2)  Continue sensitive SSI Q6H and adjust as needed Kphos 30mmol IV x 1, Kcl 10mEq IV x 2 Monitor TPN labs daily until stable, then biweekly on Mon/Thurs  Rayne Cowdrey A Giavanna Kang, PharmD Clinical Pharmacist 12/06/2023 1:31 PM

## 2023-12-06 NOTE — Progress Notes (Signed)
 Mobility Specialist - Progress Note   12/06/23 1715  Mobility  Activity Ambulated with assistance in hallway;Ambulated with assistance to bathroom  Level of Assistance Contact guard assist, steadying assist  Assistive Device Front wheel walker  Distance Ambulated (ft) 180 ft  Activity Response Tolerated well  Mobility visit 1 Mobility  Mobility Specialist Start Time (ACUTE ONLY) 1636  Mobility Specialist Stop Time (ACUTE ONLY) 1705  Mobility Specialist Time Calculation (min) (ACUTE ONLY) 29 min   Pt sitting EOB upon entry, utilizing RA. Pt STS to RW MinA and amb one lap around the NS, slow and steady gait-- cuing for body position within the RW. Upon return to room, pt requesting to use the bathroom however doesn't make it to the commode to void. Pt has BM while standing, amb to the bathroom while MS and NT completed peri car/clean up. Pt STS from commode MinA, amb to bed and left supine with alarm set and needs within reach.   Thomas [name] Mobility Specialist 12/06/23 5:18 PM

## 2023-12-07 ENCOUNTER — Inpatient Hospital Stay: Payer: 59

## 2023-12-07 DIAGNOSIS — K56609 Unspecified intestinal obstruction, unspecified as to partial versus complete obstruction: Secondary | ICD-10-CM | POA: Diagnosis not present

## 2023-12-07 LAB — CBC WITH DIFFERENTIAL/PLATELET
Abs Immature Granulocytes: 0.11 10*3/uL — ABNORMAL HIGH (ref 0.00–0.07)
Basophils Absolute: 0 10*3/uL (ref 0.0–0.1)
Basophils Relative: 0 %
Eosinophils Absolute: 0.3 10*3/uL (ref 0.0–0.5)
Eosinophils Relative: 2 %
HCT: 34.7 % — ABNORMAL LOW (ref 39.0–52.0)
Hemoglobin: 10.9 g/dL — ABNORMAL LOW (ref 13.0–17.0)
Immature Granulocytes: 1 %
Lymphocytes Relative: 15 %
Lymphs Abs: 1.9 10*3/uL (ref 0.7–4.0)
MCH: 27.2 pg (ref 26.0–34.0)
MCHC: 31.4 g/dL (ref 30.0–36.0)
MCV: 86.5 fL (ref 80.0–100.0)
Monocytes Absolute: 0.6 10*3/uL (ref 0.1–1.0)
Monocytes Relative: 5 %
Neutro Abs: 9.2 10*3/uL — ABNORMAL HIGH (ref 1.7–7.7)
Neutrophils Relative %: 77 %
Platelets: 200 10*3/uL (ref 150–400)
RBC: 4.01 MIL/uL — ABNORMAL LOW (ref 4.22–5.81)
RDW: 15.8 % — ABNORMAL HIGH (ref 11.5–15.5)
Smear Review: NORMAL
WBC: 12 10*3/uL — ABNORMAL HIGH (ref 4.0–10.5)
nRBC: 0 % (ref 0.0–0.2)

## 2023-12-07 LAB — BASIC METABOLIC PANEL
Anion gap: 9 (ref 5–15)
BUN: 22 mg/dL (ref 8–23)
CO2: 22 mmol/L (ref 22–32)
Calcium: 8.1 mg/dL — ABNORMAL LOW (ref 8.9–10.3)
Chloride: 111 mmol/L (ref 98–111)
Creatinine, Ser: 0.63 mg/dL (ref 0.61–1.24)
GFR, Estimated: >60 mL/min (ref >60)
Glucose, Bld: 143 mg/dL — ABNORMAL HIGH (ref 70–99)
Potassium: 3.2 mmol/L — ABNORMAL LOW (ref 3.5–5.1)
Sodium: 142 mmol/L (ref 135–145)

## 2023-12-07 LAB — GLUCOSE, CAPILLARY
Glucose-Capillary: 113 mg/dL — ABNORMAL HIGH (ref 70–99)
Glucose-Capillary: 121 mg/dL — ABNORMAL HIGH (ref 70–99)
Glucose-Capillary: 122 mg/dL — ABNORMAL HIGH (ref 70–99)
Glucose-Capillary: 142 mg/dL — ABNORMAL HIGH (ref 70–99)

## 2023-12-07 LAB — HEPARIN LEVEL (UNFRACTIONATED)
Heparin Unfractionated: 0.24 [IU]/mL — ABNORMAL LOW (ref 0.30–0.70)
Heparin Unfractionated: 0.18 [IU]/mL — ABNORMAL LOW (ref 0.30–0.70)

## 2023-12-07 LAB — MAGNESIUM: Magnesium: 2 mg/dL (ref 1.7–2.4)

## 2023-12-07 LAB — PHOSPHORUS: Phosphorus: 3.3 mg/dL (ref 2.5–4.6)

## 2023-12-07 MED ORDER — POTASSIUM CHLORIDE 10 MEQ/100ML IV SOLN
10.0000 meq | INTRAVENOUS | Status: AC
Start: 2023-12-07 — End: 2023-12-07
  Administered 2023-12-07 (×3): 10 meq via INTRAVENOUS
  Filled 2023-12-07 (×2): qty 100

## 2023-12-07 MED ORDER — IOHEXOL 300 MG/ML  SOLN
100.0000 mL | Freq: Once | INTRAMUSCULAR | Status: AC | PRN
  Administered 2023-12-07: 100 mL via INTRAVENOUS

## 2023-12-07 MED ORDER — HEPARIN BOLUS VIA INFUSION
2100.0000 [IU] | Freq: Once | INTRAVENOUS | Status: AC
  Administered 2023-12-07: 2100 [IU] via INTRAVENOUS
  Filled 2023-12-07: qty 2100

## 2023-12-07 MED ORDER — TRAVASOL 10 % IV SOLN
INTRAVENOUS | Status: AC
  Filled 2023-12-07: qty 1113.6

## 2023-12-07 MED ORDER — HEPARIN BOLUS VIA INFUSION
1100.0000 [IU] | Freq: Once | INTRAVENOUS | Status: AC
  Administered 2023-12-07: 1100 [IU] via INTRAVENOUS
  Filled 2023-12-07: qty 1100

## 2023-12-07 NOTE — Progress Notes (Signed)
 Mobility Specialist - Progress Note   12/07/23 1151  Mobility  Activity Ambulated with assistance in hallway  Level of Assistance Standby assist, set-up cues, supervision of patient - no hands on  Assistive Device Front wheel walker  Distance Ambulated (ft) 160 ft  Activity Response Tolerated well  Mobility visit 1 Mobility  Mobility Specialist Start Time (ACUTE ONLY) 1136  Mobility Specialist Stop Time (ACUTE ONLY) 1150  Mobility Specialist Time Calculation (min) (ACUTE ONLY) 14 min   Pt supine upon entry, utilizing RA. Pt motivated and agreeable to OOB amb in the hallway this date, reported pain in abd area. Pt completed bed mob indep, STS to RW MinA and amb one lap around the NS with supervision-- slow and steady gait. Pt returned to the room, left supine with needs within reach, RN present at bedside.  America Silvan Mobility Specialist 12/07/23 11:55 AM

## 2023-12-07 NOTE — Progress Notes (Signed)
 PHARMACY - ANTICOAGULATION CONSULT NOTE  Pharmacy Consult for heparin  infusion Indication: antithrombin III deficiency  Allergies  Allergen Reactions   Dexamethasone Other (See Comments)    Renal Insufficiency    Patient Measurements: Height: 6' (182.9 cm) Weight: 69.1 kg (152 lb 5.4 oz) IBW/kg (Calculated) : 77.6 Heparin  Dosing Weight: 70.8 kg  Vital Signs: Temp: 99.2 F (37.3 C) (01/07 0355) Temp Source: Oral (01/07 0355) BP: 119/66 (01/07 0355) Pulse Rate: 76 (01/07 0355)  Labs: Recent Labs    12/05/23 0616 12/06/23 0509 12/07/23 0540  HGB 11.3* 10.5* 10.9*  HCT 35.4* 33.2* 34.7*  PLT 212 201 200  HEPARINUNFRC 0.46 0.33 0.24*  CREATININE 0.81 0.78 0.63    Estimated Creatinine Clearance: 75.6 mL/min (by C-G formula based on SCr of 0.63 mg/dL).   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  He is on warfarin prior to admission (last INR 1.8) and is being placed on IV heparin  until warfarin can be resumed  Goal of Therapy:  Heparin  level 0.3-0.7 units/ml Monitor platelets by anticoagulation protocol: Yes   Plan:  1/6:  HL @ 0540 = 0.24, subtherapeutic - Bolus 1100 units x 1 - Increase heparin  infusion to 1700 units/hr - Recheck HL in 8 hrs after rate change - Continue to monitor H&H and platelets  Rankin CANDIE Dills, PharmD, [facility] 12/07/2023 6:30  AM

## 2023-12-07 NOTE — Progress Notes (Signed)
 PT Cancellation Note  Patient Details Name: Thomas [name] MRN: [id number] DOB: 1945/12/13   Cancelled Treatment:     Pt declined stating he had ambulated in hall with Mobility Specialist earlier and currently is feeling nauseous. Emesis bag given to pt. Will continue per POC. Pt also declined practicing stairs, stating he doesn't anticipate a problem.   [name] 12/07/2023, 4:17 PM

## 2023-12-07 NOTE — Progress Notes (Signed)
 PHARMACY - ANTICOAGULATION CONSULT NOTE  Pharmacy Consult for heparin  infusion Indication: antithrombin III deficiency  Allergies  Allergen Reactions   Dexamethasone Other (See Comments)    Renal Insufficiency    Patient Measurements: Height: 6' (182.9 cm) Weight: 69.1 kg (152 lb 5.4 oz) IBW/kg (Calculated) : 77.6 Heparin  Dosing Weight: 70.8 kg  Vital Signs: Temp: 98 F (36.7 C) (01/07 1410) BP: 138/78 (01/07 1410) Pulse Rate: 70 (01/07 1410)  Labs: Recent Labs    12/05/23 0616 12/06/23 0509 12/07/23 0540 12/07/23 1507  HGB 11.3* 10.5* 10.9*  --   HCT 35.4* 33.2* 34.7*  --   PLT 212 201 200  --   HEPARINUNFRC 0.46 0.33 0.24* 0.18*  CREATININE 0.81 0.78 0.63  --     Estimated Creatinine Clearance: 75.6 mL/min (by C-G formula based on SCr of 0.63 mg/dL).   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  He is on warfarin prior to admission (last INR 1.8) and is being placed on IV heparin  until warfarin can be resumed  1/6 0540 HL 0.24  Subtherapeutic 1/6 1507 HL 0.18 Subtherapeutic  No issues with infusion reported, no signs/symptoms of bleeding noted.  Goal of Therapy:  Heparin  level 0.3-0.7 units/ml Monitor platelets by anticoagulation protocol: Yes   Plan:  Give heparin  bolus of 2100 units x1 Increase heparin  infusion to 1900 units/hr Recheck HL in  8 hrs after rate change Continue to monitor H&H and platelets  Thank you for involving pharmacy in this patient's care.   Damien Napoleon, PharmD Clinical Pharmacist 12/07/2023 4:09 PM

## 2023-12-07 NOTE — Progress Notes (Signed)
 Progress Note   Patient: Thomas [name] FMW:[id number] DOB: [birth year] DOA: 11/30/2023     7 DOS: the patient was seen and examined on 12/07/2023   Hospital Course: 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 He had not been eating well at home before multiple days due to worsening abdominal distention and pain.  He had been still taking Coumadin .   CT abdomen/pelvis showed apple core lesion within the proximal transverse colon measuring 4.3 cm in length concerning for colonic malignancy and high-grade bowel obstruction due to this. General surgery was consulted and patient was admitted for further workup.   INR was markedly elevated, 9.4 on admission and he underwent reversal with vitamin K  and FFP. He then underwent right hemicolectomy with primary anastomosis with general surgery on 12/01/2023.     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[facility]) - Follow-up surgical pathology - Hematology/oncology consult pending pathology but will try to arrange prior to discharge   Essential hypertension - PRN meds for now   Hyperlipidemia -Continue on lipitor at home; will resume as able    H/O deep venous thrombosis - On Coumadin  at home, currently on heparin  drip   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    Appearance: Normal appearance.  HENT:     Head: Normocephalic and atraumatic.     Mouth/Throat:     Mouth: Mucous membranes are moist.  Eyes:     Extraocular Movements: Extraocular movements intact.  Cardiovascular:     Rate and Rhythm: Normal rate and regular rhythm.  Pulmonary:     Effort: Pulmonary effort is normal. No respiratory distress.     Breath sounds: Normal breath sounds. No wheezing.  Abdominal: Peri-incisional site tenderness    Comments: Midline incision that appears clean and dry, wound VAC removed Musculoskeletal:        General: Normal range of motion.     Cervical back: Normal range of motion and neck supple.  Skin:    General: Skin is warm and dry.  Neurological:     General: No focal deficit present.     Mental Status: He is alert.  Psychiatric:  Mood and Affect: Mood normal.        Behavior: Behavior normal.    Subjective: Patient seen and examined at bedside this morning NG tube in place as well as heparin  drip running Wound VAC connected to the midline incisional wound Still having TPN Denied worsening abdominal pain chest pain or cough   Data reviewed:      Latest Ref Rng & Units 12/07/2023    5:40 AM 12/06/2023    5:09 AM 12/05/2023    6:16 AM  BMP   Glucose 70 - 99 mg/dL 856  833  74   BUN 8 - 23 mg/dL 22  23  25    Creatinine 0.61 - 1.24 mg/dL 9.36  9.21  9.18   Sodium 135 - 145 mmol/L 142  143  141   Potassium 3.5 - 5.1 mmol/L 3.2  3.0  3.7   Chloride 98 - 111 mmol/L 111  111  109   CO2 22 - 32 mmol/L 22  23  18    Calcium  8.9 - 10.3 mg/dL 8.1  7.8  8.0     Vitals:   12/06/23 2017 12/07/23 0355 12/07/23 0737 12/07/23 1410  BP: 113/65 119/66 111/66 138/78  Pulse: 84 76 72 70  Resp: 18 18 16 18   Temp: 98.2 F (36.8 C) 99.2 F (37.3 C) 97.8 F (36.6 C) 98 F (36.7 C)  TempSrc: Oral Oral    SpO2: 94% 95% 94% 96%  Weight:      Height:          Latest Ref Rng & Units 12/07/2023    5:40 AM 12/06/2023    5:09 AM 12/05/2023    6:16 AM  CBC  WBC 4.0 - 10.5 K/uL 12.0  7.2  7.4   Hemoglobin 13.0 - 17.0 g/dL 89.0  89.4  88.6   Hematocrit 39.0 - 52.0 % 34.7  33.2  35.4   Platelets 150 - 400 K/uL 200  201  212      Author: Drue ONEIDA Potter, MD 12/07/2023 5:01 PM  For on call review [email or web address].

## 2023-12-07 NOTE — Consult Note (Signed)
 PHARMACY - TOTAL PARENTERAL NUTRITION CONSULT NOTE   Indication: Prolonged ileus  Patient Measurements: Height: 6' (182.9 cm) Weight: 69.1 kg (152 lb 5.4 oz) IBW/kg (Calculated) : 77.6 TPN AdjBW (KG): 70.8 Body mass index is 20.66 kg/m. Usual Weight: 70.8 kg   Assessment: My Madariaga is a 78 year old male that presented to the ED with abdominal pain and vomiting. CT abdomen/pelvis showed apple core lesion within the proximal transverse colon measuring 4.3 cm in length concerning for colonic malignancy and high-grade bowel obstruction due to this. General surgery was consulted and have recommended patient start TPN for a prolonged ileus.   Glucose / Insulin : sensitive SSI Q6H Insulin  need last 24h: 4 units Electrolytes: K 3.2, other electrolytes WNL Renal: SCr 0.78>0.63 Hepatic: AST/ALT WNL Intake / Output; MIVF:  GI Imaging: CT abdomen pelvis concerning for colonic malignancy and high-grade bowel obstruction GI Surgeries / Procedures:  1/2: extended right hemicolectomy   Central access: PICC line has been ordered  TPN start date: 12/05/2023   Nutritional Goals:  Current compounded TPN provides 111 gm of protein, 2,000 Kcal, 2L   RD Assessment: Estimated Needs Total Energy Estimated Needs: 2100-2400kcal/day Total Protein Estimated Needs: 105-120g/day Total Fluid Estimated Needs: 1.8-2.1L/day  Current Nutrition: NPO  Plan:  Increase TPN to goal rate of 10ml/hr Electrolytes in TPN: Na 51mEq/L, K 26mEq/L(increased on 1/7), Ca 95mEq/L, Mg 50mEq/L, and Phos 15mmol/L. Cl:Ac 1:1 Add standard MVI and trace elements to TPN  Thiamine  100 mg daily added to TPN per dietician recommendations(Day 3)  Continue sensitive SSI Q6H and adjust as needed Kcl 10mEq IV x 3 Monitor TPN labs daily until stable, then biweekly on Mon/Thurs  Luie Laneve A Marquay Kruse, PharmD Clinical Pharmacist 12/07/2023 8:48 AM

## 2023-12-07 NOTE — Progress Notes (Signed)
 Patient ID: Thomas [name], male   DOB: 12/13/1945, 78 y.o.   MRN: [id number]     SURGICAL PROGRESS NOTE   Hospital Day(s): 7.   Interval History: Patient seen and examined, no acute events or new complaints overnight. Patient reports feeling tired.  He also feels distended.  Patient denies passing gas or bowel movement but a large bowel movement was reported yesterday at 5 PM.  Vital signs in last 24 hours: [min-max] current  Temp:  [97.8 F (36.6 C)-99.2 F (37.3 C)] 97.8 F (36.6 C) (01/07 0737) Pulse Rate:  [72-92] 72 (01/07 0737) Resp:  [16-20] 16 (01/07 0737) BP: (111-129)/(65-99) 111/66 (01/07 0737) SpO2:  [94 %-97 %] 94 % (01/07 0737)     Height: 6' (182.9 cm) Weight: 69.1 kg BMI (Calculated): 20.66   Physical Exam:  Constitutional: alert, cooperative and no distress  Respiratory: breathing non-labored at rest  Cardiovascular: regular rate and sinus rhythm  Gastrointestinal: soft, non-tender, distended  Labs:     Latest Ref Rng & Units 12/07/2023    5:40 AM 12/06/2023    5:09 AM 12/05/2023    6:16 AM  CBC  WBC 4.0 - 10.5 K/uL 12.0  7.2  7.4   Hemoglobin 13.0 - 17.0 g/dL 89.0  89.4  88.6   Hematocrit 39.0 - 52.0 % 34.7  33.2  35.4   Platelets 150 - 400 K/uL 200  201  212       Latest Ref Rng & Units 12/07/2023    5:40 AM 12/06/2023    5:09 AM 12/05/2023    6:16 AM  CMP  Glucose 70 - 99 mg/dL 856  833  74   BUN 8 - 23 mg/dL 22  23  25    Creatinine 0.61 - 1.24 mg/dL 9.36  9.21  9.18   Sodium 135 - 145 mmol/L 142  143  141   Potassium 3.5 - 5.1 mmol/L 3.2  3.0  3.7   Chloride 98 - 111 mmol/L 111  111  109   CO2 22 - 32 mmol/L 22  23  18    Calcium  8.9 - 10.3 mg/dL 8.1  7.8  8.0   Total Protein 6.5 - 8.1 g/dL  5.9    Total Bilirubin 0.0 - 1.2 mg/dL  0.5    Alkaline Phos 38 - 126 U/L  41    AST 15 - 41 U/L  19    ALT 0 - 44 U/L  17      Imaging studies: No new pertinent imaging studies   Assessment/Plan:  78 y.o. male with mass of the transverse colon with obstruction  5 Day Post-Op s/p extended right hemicolectomy, complicated by pertinent comorbidities including Antithrombin III deficiency.    -Continue slow recovery but no clinical toleration. -Hemoglobin continued to be stable.  No sign of bleeding.  Continue heparin  drip for adding thrombin 3 deficiency treatment. -With tiredness and increased leukocytosis, will do CT scan of the abdomen and pelvis to rule out intra-abdominal complications such as abscess. -Continue TPN for prolonged ileus -Continue pain management -Appreciate Hematology/Oncology consult -Encouraged the patient to ambulate.  Continue PT/OT therapies  Lucas Petrin, MD

## 2023-12-07 NOTE — Plan of Care (Signed)

## 2023-12-08 DIAGNOSIS — K56609 Unspecified intestinal obstruction, unspecified as to partial versus complete obstruction: Secondary | ICD-10-CM | POA: Diagnosis not present

## 2023-12-08 LAB — GLUCOSE, CAPILLARY
Glucose-Capillary: 107 mg/dL — ABNORMAL HIGH (ref 70–99)
Glucose-Capillary: 121 mg/dL — ABNORMAL HIGH (ref 70–99)
Glucose-Capillary: 102 mg/dL — ABNORMAL HIGH (ref 70–99)
Glucose-Capillary: 105 mg/dL — ABNORMAL HIGH (ref 70–99)
Glucose-Capillary: 118 mg/dL — ABNORMAL HIGH (ref 70–99)

## 2023-12-08 LAB — CBC WITH DIFFERENTIAL/PLATELET
Abs Immature Granulocytes: 0.19 10*3/uL — ABNORMAL HIGH (ref 0.00–0.07)
Basophils Absolute: 0 10*3/uL (ref 0.0–0.1)
Basophils Relative: 0 %
Eosinophils Absolute: 0.4 10*3/uL (ref 0.0–0.5)
Eosinophils Relative: 2 %
HCT: 34.2 % — ABNORMAL LOW (ref 39.0–52.0)
Hemoglobin: 11 g/dL — ABNORMAL LOW (ref 13.0–17.0)
Immature Granulocytes: 1 %
Lymphocytes Relative: 13 %
Lymphs Abs: 1.8 10*3/uL (ref 0.7–4.0)
MCH: 27.2 pg (ref 26.0–34.0)
MCHC: 32.2 g/dL (ref 30.0–36.0)
MCV: 84.4 fL (ref 80.0–100.0)
Monocytes Absolute: 0.4 10*3/uL (ref 0.1–1.0)
Monocytes Relative: 3 %
Neutro Abs: 11.7 10*3/uL — ABNORMAL HIGH (ref 1.7–7.7)
Neutrophils Relative %: 81 %
Platelets: 183 10*3/uL (ref 150–400)
RBC: 4.05 MIL/uL — ABNORMAL LOW (ref 4.22–5.81)
RDW: 15.8 % — ABNORMAL HIGH (ref 11.5–15.5)
Smear Review: NORMAL
WBC: 14.5 10*3/uL — ABNORMAL HIGH (ref 4.0–10.5)
nRBC: 0 % (ref 0.0–0.2)

## 2023-12-08 LAB — BASIC METABOLIC PANEL
Anion gap: 8 (ref 5–15)
BUN: 22 mg/dL (ref 8–23)
CO2: 20 mmol/L — ABNORMAL LOW (ref 22–32)
Calcium: 8 mg/dL — ABNORMAL LOW (ref 8.9–10.3)
Chloride: 109 mmol/L (ref 98–111)
Creatinine, Ser: 0.64 mg/dL (ref 0.61–1.24)
GFR, Estimated: >60 mL/min (ref >60)
Glucose, Bld: 116 mg/dL — ABNORMAL HIGH (ref 70–99)
Potassium: 3.7 mmol/L (ref 3.5–5.1)
Sodium: 137 mmol/L (ref 135–145)

## 2023-12-08 LAB — MAGNESIUM: Magnesium: 2 mg/dL (ref 1.7–2.4)

## 2023-12-08 LAB — HEPARIN LEVEL (UNFRACTIONATED)
Heparin Unfractionated: 0.39 [IU]/mL (ref 0.30–0.70)
Heparin Unfractionated: 0.23 [IU]/mL — ABNORMAL LOW (ref 0.30–0.70)
Heparin Unfractionated: 0.5 [IU]/mL (ref 0.30–0.70)

## 2023-12-08 LAB — PHOSPHORUS: Phosphorus: 2.9 mg/dL (ref 2.5–4.6)

## 2023-12-08 MED ORDER — TRAVASOL 10 % IV SOLN
INTRAVENOUS | Status: DC
  Filled 2023-12-08: qty 1113.6

## 2023-12-08 MED ORDER — HEPARIN BOLUS VIA INFUSION
1100.0000 [IU] | Freq: Once | INTRAVENOUS | Status: AC
  Administered 2023-12-08: 1100 [IU] via INTRAVENOUS
  Filled 2023-12-08: qty 1100

## 2023-12-08 NOTE — Progress Notes (Signed)
 PHARMACY - ANTICOAGULATION CONSULT NOTE  Pharmacy Consult for heparin  infusion Indication: antithrombin III deficiency  Allergies  Allergen Reactions   Dexamethasone Other (See Comments)    Renal Insufficiency    Patient Measurements: Height: 6' (182.9 cm) Weight: 69.1 kg (152 lb 5.4 oz) IBW/kg (Calculated) : 77.6 Heparin  Dosing Weight: 70.8 kg  Vital Signs: Temp: 97.4 F (36.3 C) (01/07 2110) Temp Source: Oral (01/07 2110) BP: 115/59 (01/07 2110) Pulse Rate: 74 (01/07 2110)  Labs: Recent Labs    12/05/23 0616 12/06/23 0509 12/07/23 0540 12/07/23 1507 12/08/23 0113  HGB 11.3* 10.5* 10.9*  --  11.0*  HCT 35.4* 33.2* 34.7*  --  34.2*  PLT 212 201 200  --  183  HEPARINUNFRC 0.46 0.33 0.24* 0.18* 0.39  CREATININE 0.81 0.78 0.63  --   --     Estimated Creatinine Clearance: 75.6 mL/min (by C-G formula based on SCr of 0.63 mg/dL).   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  He is on warfarin prior to admission (last INR 1.8) and is being placed on IV heparin  until warfarin can be resumed  1/6 0540 HL 0.24  Subtherapeutic 1/6 1507 HL 0.18 Subtherapeutic 1/7 0113 HL 0.39 Therapeutic x 1  No issues with infusion reported, no signs/symptoms of bleeding noted.  Goal of Therapy:  Heparin  level 0.3-0.7 units/ml Monitor platelets by anticoagulation protocol:  Yes   Plan:  Continue heparin  infusion at 1900 units/hr Recheck HL in 8 hrs to confirm Continue to monitor H&H and platelets  Thank you for involving pharmacy in this patient's care.   Rankin CANDIE Dills, PharmD, [facility] 12/08/2023 2:07 AM

## 2023-12-08 NOTE — Progress Notes (Signed)
 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 He had not been eating well at home before multiple days due to worsening abdominal distention and pain.  He had been still taking Coumadin .   CT abdomen/pelvis showed apple core lesion within the proximal transverse colon measuring 4.3 cm in length concerning for colonic malignancy and high-grade bowel obstruction due to this. General surgery was consulted and patient was admitted for further workup.   INR was markedly elevated, 9.4 on admission and he underwent reversal with vitamin K  and FFP. He then underwent right hemicolectomy with primary anastomosis with general surgery on 12/01/2023.     Assessment and Plan: * Large bowel obstruction (HCC) - Secondary to apple core lesion within the proximal transverse colon -General Surgery following, appreciate assistance -S/p right hemicolectomy with primary anastomosis on 12/01/2023 Continue NG tube management according to surgical recs Continue TPN Continue as needed antiemetics Plan of care discussed with surgeon   Nausea and vomiting General Surgery on board and case discussed   Antithrombin 3 deficiency (HCC) - On Coumadin  outpatient - Patient has been compliant with Coumadin  but having extremely poor oral intake for several days likely accounting for supratherapeutic INR on admission.  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[facility]) - Follow-up surgical pathology - Hematology/oncology consult pending pathology but will try to arrange prior to discharge   Essential hypertension - PRN meds for now   Hyperlipidemia - on lipitor at home; will resume as able    H/O deep venous thrombosis - On Coumadin  at home, currently on heparin  drip   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    Appearance: Normal appearance.  HENT:     Head: Normocephalic and atraumatic.     Mouth/Throat:     Mouth: Mucous membranes are moist.  Eyes:     Extraocular Movements: Extraocular movements intact.  Cardiovascular:     Rate and Rhythm: Normal rate and regular rhythm.  Pulmonary:     Effort: Pulmonary effort is normal. No respiratory distress.     Breath sounds: Normal breath sounds. No wheezing.  Abdominal: Peri-incisional site tenderness    Comments: Midline incision that appears clean and dry, wound VAC removed Musculoskeletal:        General: Normal range of motion.     Cervical back: Normal range of motion and neck supple.  Skin:    General: Skin is warm and dry.  Neurological:     General: No focal deficit present.     Mental Status: He  is alert.  Psychiatric:        Mood and Affect: Mood normal.        Behavior: Behavior normal.    Subjective: Patient seen and examined at bedside this morning Still has NG tube in place as well as TPN running Denies worsening abdominal pain chest pain or cough   Data reviewed:  Vitals:   12/08/23 0500 12/08/23 0523 12/08/23 0828 12/08/23 1550  BP:  (!) 114/58 (!) 116/59 (!) 121/58  Pulse:  82 82 94  Resp:  18  18 16   Temp:  98.7 F (37.1 C) 99.2 F (37.3 C) 99.5 F (37.5 C)  TempSrc:  Oral Oral Oral  SpO2:  92% 93% 92%  Weight: 77.9 kg     Height:         Author: Drue ONEIDA Potter, MD 12/08/2023 4:51 PM  For on call review [email or web address].

## 2023-12-08 NOTE — Progress Notes (Signed)
 PHARMACY - ANTICOAGULATION CONSULT NOTE  Pharmacy Consult for heparin  infusion Indication: antithrombin III deficiency  Allergies  Allergen Reactions   Dexamethasone Other (See Comments)    Renal Insufficiency    Patient Measurements: Height: 6' (182.9 cm) Weight: 77.9 kg (171 lb 11.8 oz) IBW/kg (Calculated) : 77.6 Heparin  Dosing Weight: 70.8 kg  Vital Signs: Temp: 99.2 F (37.3 C) (01/08 0828) Temp Source: Oral (01/08 0828) BP: 116/59 (01/08 0828) Pulse Rate: 82 (01/08 0828)  Labs: Recent Labs    12/06/23 0509 12/07/23 0540 12/07/23 1507 12/08/23 0113 12/08/23 1007  HGB 10.5* 10.9*  --  11.0*  --   HCT 33.2* 34.7*  --  34.2*  --   PLT 201 200  --  183  --   HEPARINUNFRC 0.33 0.24* 0.18* 0.39 0.23*  CREATININE 0.78 0.63  --  0.64  --     Estimated Creatinine Clearance: 84.9 mL/min (by C-G formula based on SCr of 0.64 mg/dL).   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  He is on warfarin prior to admission (last INR 1.8) and is being placed on IV heparin  until warfarin can be resumed   1/7 0540 HL 0.24 Subtherapeutic 1/7 1507 HL 0.18 Subtherapeutic 1/8 0113 HL 0.39 Therapeutic x 1 1/8 1007 HL 0.23 Subtherapeutic  No issues with infusion reported, no signs/symptoms of bleeding noted.  Goal of Therapy:  Heparin  level 0.3-0.7 units/ml Monitor  platelets by anticoagulation protocol: Yes   Plan:  Bolus 1100 units x 1 Increase heparin  infusion to 2050 units/hr Check HL in 8 hrs after rate change Continue to monitor H&H and platelets  Thank you for involving pharmacy in this patient's care.   Aeriana Speece A Trenten Watchman, PharmD Clinical Pharmacist 12/08/2023 11:23 AM

## 2023-12-08 NOTE — Plan of Care (Signed)

## 2023-12-08 NOTE — Consult Note (Signed)
 PHARMACY - TOTAL PARENTERAL NUTRITION CONSULT NOTE   Indication: Prolonged ileus  Patient Measurements: Height: 6' (182.9 cm) Weight: 77.9 kg (171 lb 11.8 oz) IBW/kg (Calculated) : 77.6 TPN AdjBW (KG): 70.8 Body mass index is 23.29 kg/m. Usual Weight: 70.8 kg   Assessment: Thomas [name] is a 78 year old male that presented to the ED with abdominal pain and vomiting. CT abdomen/pelvis showed apple core lesion within the proximal transverse colon measuring 4.3 cm in length concerning for colonic malignancy and high-grade bowel obstruction due to this. General surgery was consulted and have recommended patient start TPN for a prolonged ileus.   Glucose / Insulin : sensitive SSI Q6H Insulin  need last 24h: 2 units Electrolytes:  WNL Renal: SCr 0.78-->0.64 Hepatic: AST/ALT WNL Intake / Output; MIVF:  GI Imaging: CT abdomen pelvis concerning for colonic malignancy and high-grade bowel obstruction GI Surgeries / Procedures:  1/2: extended right hemicolectomy   Central access: PICC line has been ordered  TPN start date: 12/05/2023   Nutritional Goals:  Current compounded TPN provides 111 gm of protein, 2,000 Kcal, 2L   RD Assessment: Estimated Needs Total Energy Estimated Needs: 2100-2400kcal/day Total Protein Estimated Needs: 105-120g/day Total Fluid Estimated Needs: 1.8-2.1L/day  Current Nutrition: NPO  Plan:  Continue TPN to goal rate of 51ml/hr Electrolytes in TPN: Na 93mEq/L, K 87mEq/L(increased on 1/7), Ca 34mEq/L, Mg 50mEq/L, and Phos 15mmol/L. Cl:Ac 1:1 Add standard MVI and trace elements to TPN  Thiamine  100 mg daily added to TPN per dietician recommendations(Day 4)  Continue sensitive SSI Q6H and adjust as needed Monitor TPN labs daily until stable, then biweekly on Mon/Thurs  Thomas [name], PharmD Clinical Pharmacist 12/08/2023 9:15 AM

## 2023-12-08 NOTE — Progress Notes (Signed)
 PHARMACY - ANTICOAGULATION CONSULT NOTE  Pharmacy Consult for heparin  infusion Indication: antithrombin III deficiency  Allergies  Allergen Reactions   Dexamethasone Other (See Comments)    Renal Insufficiency    Patient Measurements: Height: 6' (182.9 cm) Weight: 77.9 kg (171 lb 11.8 oz) IBW/kg (Calculated) : 77.6 Heparin  Dosing Weight: 70.8 kg  Vital Signs: Temp: 98.6 F (37 C) (01/08 2030) Temp Source: Oral (01/08 2030) BP: 131/57 (01/08 2030) Pulse Rate: 91 (01/08 2030)  Labs: Recent Labs    12/06/23 0509 12/07/23 0540 12/07/23 1507 12/08/23 0113 12/08/23 1007 12/08/23 2028  HGB 10.5* 10.9*  --  11.0*  --   --   HCT 33.2* 34.7*  --  34.2*  --   --   PLT 201 200  --  183  --   --   HEPARINUNFRC 0.33 0.24*   < > 0.39 0.23* 0.50  CREATININE 0.78 0.63  --  0.64  --   --    < > = values in this interval not displayed.    Estimated Creatinine Clearance: 84.9 mL/min (by C-G formula based on SCr of 0.64 mg/dL).   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  He is on warfarin prior to admission (last INR 1.8) and is being placed on IV heparin  until warfarin can be resumed   1/7 0540 HL 0.24 Subtherapeutic 1/7 1507 HL 0.18 Subtherapeutic 1/8 0113 HL 0.39 Therapeutic x 1 1/8 1007 HL 0.23 Subtherapeutic 1/8 2028 HL 0.50 Therapeutic x 1  No issues with  infusion reported, no signs/symptoms of bleeding noted.  Goal of Therapy:  Heparin  level 0.3-0.7 units/ml Monitor platelets by anticoagulation protocol: Yes   Plan:  Continue heparin  infusion 2050 units/hr Check HL in 8 hrs after rate change Continue to monitor H&H and platelets  Thank you for involving pharmacy in this patient's care.   Elsie CHRISTELLA Piety, PharmD Clinical Pharmacist 12/08/2023 8:58 PM

## 2023-12-08 NOTE — Progress Notes (Signed)
 PT Cancellation Note  Patient Details Name: Thomas [name] MRN: [id number] DOB: [birth year]   Cancelled Treatment:    Reason Eval/Treat Not Completed: Other (comment) (Twice attepted to see patient for PT treatment. Pt attempting PO nutrition with NA on 2nd attempt, does not appear to be going well. Pt asks dino return much later- much, much later. Will continue to follow.)  3:29 PM, 12/08/23 [name] Linear, PT, DPT Physical Therapist - [facility]  [phone] (ASCOM)    Mahtomedi C 12/08/2023, 3:28 PM

## 2023-12-08 NOTE — Progress Notes (Signed)
 Patient ID: Thomas [name], male   DOB: 25-Jul-1946, 78 y.o.   MRN: [id number]     SURGICAL PROGRESS NOTE   Hospital Day(s): 8.   Interval History: Patient seen and examined, no acute events or new complaints overnight. Patient reports feeling very happy because he passed gas.  He had 2 episode of rectal gas.  He denies any nausea.  He denies any abdominal pain.  Vital signs in last 24 hours: [min-max] current  Temp:  [97.4 F (36.3 C)-98.7 F (37.1 C)] 98.7 F (37.1 C) (01/08 0523) Pulse Rate:  [70-82] 82 (01/08 0523) Resp:  [16-18] 18 (01/08 0523) BP: (111-138)/(58-78) 114/58 (01/08 0523) SpO2:  [92 %-96 %] 92 % (01/08 0523) Weight:  [77.9 kg] 77.9 kg (01/08 0500)     Height: 6' (182.9 cm) Weight: 77.9 kg BMI (Calculated): 23.29   Physical Exam:  Constitutional: alert, cooperative and no distress  Respiratory: breathing non-labored at rest  Cardiovascular: regular rate and sinus rhythm  Gastrointestinal: soft, non-tender, and mild-distended  Labs:     Latest Ref Rng & Units 12/08/2023    1:13 AM 12/07/2023    5:40 AM 12/06/2023    5:09 AM  CBC  WBC 4.0 - 10.5 K/uL 14.5  12.0  7.2   Hemoglobin 13.0 - 17.0 g/dL 88.9  89.0  89.4   Hematocrit 39.0 - 52.0 % 34.2  34.7  33.2   Platelets 150 - 400 K/uL 183  200  201       Latest Ref Rng & Units 12/08/2023    1:13 AM 12/07/2023    5:40 AM 12/06/2023    5:09 AM  CMP  Glucose 70 - 99 mg/dL 883  856  833   BUN 8 - 23 mg/dL 22  22  23    Creatinine 0.61 - 1.24 mg/dL 9.35  9.36  9.21   Sodium 135 - 145 mmol/L 137  142  143   Potassium 3.5 - 5.1 mmol/L 3.7  3.2  3.0   Chloride 98 - 111 mmol/L 109  111  111   CO2 22 - 32 mmol/L 20  22  23    Calcium  8.9 - 10.3 mg/dL 8.0  8.1  7.8   Total Protein 6.5 - 8.1 g/dL   5.9   Total Bilirubin 0.0 - 1.2 mg/dL   0.5   Alkaline Phos 38 - 126 U/L   41   AST 15 - 41 U/L   19   ALT 0 - 44 U/L   17     Imaging studies: CT scan of the abdomen shows small bowel dilation consistent with ileus.  No  abscess.   Assessment/Plan:  78 y.o. male with mass of the transverse colon with obstruction 7 Day Post-Op s/p extended right hemicolectomy, complicated by pertinent comorbidities including Antithrombin III deficiency.   -Continue slow recovery but no clinical toleration. -Hemoglobin continued to be stable.  No sign of bleeding.  Continue heparin  drip for adding thrombin 3 deficiency treatment. -He passed gas, hopefully this is the first sign of return of bowel function.  Will plan NGT in for a critically diet.  Continue TPN -Continue pain management -Appreciate Hematology/Oncology consult -Encouraged the patient to ambulate.  Continue PT/OT therapies  Lucas Petrin, MD

## 2023-12-09 DIAGNOSIS — K56609 Unspecified intestinal obstruction, unspecified as to partial versus complete obstruction: Secondary | ICD-10-CM | POA: Diagnosis not present

## 2023-12-09 DIAGNOSIS — I10 Essential (primary) hypertension: Secondary | ICD-10-CM

## 2023-12-09 DIAGNOSIS — Z9889 Other specified postprocedural states: Secondary | ICD-10-CM

## 2023-12-09 DIAGNOSIS — Z85828 Personal history of other malignant neoplasm of skin: Secondary | ICD-10-CM

## 2023-12-09 DIAGNOSIS — Z86718 Personal history of other venous thrombosis and embolism: Secondary | ICD-10-CM | POA: Diagnosis not present

## 2023-12-09 LAB — CBC WITH DIFFERENTIAL/PLATELET
Abs Immature Granulocytes: 0.2 10*3/uL — ABNORMAL HIGH (ref 0.00–0.07)
Basophils Absolute: 0 10*3/uL (ref 0.0–0.1)
Basophils Relative: 0 %
Eosinophils Absolute: 0.1 10*3/uL (ref 0.0–0.5)
Eosinophils Relative: 1 %
HCT: 31.2 % — ABNORMAL LOW (ref 39.0–52.0)
Hemoglobin: 10.1 g/dL — ABNORMAL LOW (ref 13.0–17.0)
Immature Granulocytes: 2 %
Lymphocytes Relative: 11 %
Lymphs Abs: 1.2 10*3/uL (ref 0.7–4.0)
MCH: 28 pg (ref 26.0–34.0)
MCHC: 32.4 g/dL (ref 30.0–36.0)
MCV: 86.4 fL (ref 80.0–100.0)
Monocytes Absolute: 0.5 10*3/uL (ref 0.1–1.0)
Monocytes Relative: 4 %
Neutro Abs: 8.8 10*3/uL — ABNORMAL HIGH (ref 1.7–7.7)
Neutrophils Relative %: 82 %
Platelets: 149 10*3/uL — ABNORMAL LOW (ref 150–400)
RBC: 3.61 MIL/uL — ABNORMAL LOW (ref 4.22–5.81)
RDW: 16.1 % — ABNORMAL HIGH (ref 11.5–15.5)
Smear Review: NORMAL
WBC: 10.8 10*3/uL — ABNORMAL HIGH (ref 4.0–10.5)
nRBC: 0 % (ref 0.0–0.2)

## 2023-12-09 LAB — COMPREHENSIVE METABOLIC PANEL
ALT: 22 U/L (ref 0–44)
AST: 30 U/L (ref 15–41)
Albumin: 2.2 g/dL — ABNORMAL LOW (ref 3.5–5.0)
Alkaline Phosphatase: 41 U/L (ref 38–126)
Anion gap: 7 (ref 5–15)
BUN: 23 mg/dL (ref 8–23)
CO2: 18 mmol/L — ABNORMAL LOW (ref 22–32)
Calcium: 7.7 mg/dL — ABNORMAL LOW (ref 8.9–10.3)
Chloride: 109 mmol/L (ref 98–111)
Creatinine, Ser: 0.7 mg/dL (ref 0.61–1.24)
GFR, Estimated: >60 mL/min (ref >60)
Glucose, Bld: 117 mg/dL — ABNORMAL HIGH (ref 70–99)
Potassium: 3.7 mmol/L (ref 3.5–5.1)
Sodium: 134 mmol/L — ABNORMAL LOW (ref 135–145)
Total Bilirubin: 0.3 mg/dL (ref 0.0–1.2)
Total Protein: 5.5 g/dL — ABNORMAL LOW (ref 6.5–8.1)

## 2023-12-09 LAB — HEPARIN LEVEL (UNFRACTIONATED): Heparin Unfractionated: 0.6 [IU]/mL (ref 0.30–0.70)

## 2023-12-09 LAB — MAGNESIUM: Magnesium: 2 mg/dL (ref 1.7–2.4)

## 2023-12-09 LAB — PHOSPHORUS: Phosphorus: 3.2 mg/dL (ref 2.5–4.6)

## 2023-12-09 LAB — GLUCOSE, CAPILLARY
Glucose-Capillary: 109 mg/dL — ABNORMAL HIGH (ref 70–99)
Glucose-Capillary: 102 mg/dL — ABNORMAL HIGH (ref 70–99)

## 2023-12-09 LAB — PROTIME-INR
INR: 1.3 — ABNORMAL HIGH (ref 0.8–1.2)
Prothrombin Time: 16 s — ABNORMAL HIGH (ref 11.4–15.2)

## 2023-12-09 MED ORDER — TRAVASOL 10 % IV SOLN
INTRAVENOUS | Status: DC
  Filled 2023-12-09: qty 556.8

## 2023-12-09 MED ORDER — WARFARIN - PHARMACIST DOSING INPATIENT: Freq: Every day | Status: DC

## 2023-12-09 MED ORDER — ENOXAPARIN SODIUM 80 MG/0.8ML IJ SOSY: 80.0000 mg | PREFILLED_SYRINGE | Freq: Two times a day (BID) | INTRAMUSCULAR | 0 refills | Status: DC

## 2023-12-09 MED ORDER — ENOXAPARIN SODIUM 80 MG/0.8ML IJ SOSY
80.0000 mg | PREFILLED_SYRINGE | Freq: Two times a day (BID) | INTRAMUSCULAR | Status: DC
  Administered 2023-12-09: 80 mg via SUBCUTANEOUS
  Filled 2023-12-09 (×2): qty 0.8

## 2023-12-09 MED ORDER — WARFARIN SODIUM 7.5 MG PO TABS
7.5000 mg | ORAL_TABLET | Freq: Once | ORAL | Status: AC
  Administered 2023-12-09: 7.5 mg via ORAL
  Filled 2023-12-09: qty 1

## 2023-12-09 NOTE — Discharge Summary (Signed)
 Physician Discharge Summary   Patient: Thomas [name] MRN: [id number] DOB: [birth year]  Admit date:     11/30/2023  Discharge date: 12/09/23  Discharge Physician: Concepcion Riser   PCP: Melvin Pao, NP   Recommendations at discharge:    PCP follow up in 1 week Surgery clinic follow up as scheduled. Oncology follow up as scheduled.  Discharge Diagnoses: Principal Problem:   Large bowel obstruction (HCC) Active Problems:   Nausea and vomiting   Antithrombin 3 deficiency (HCC)   Carcinoma of transverse colon (HCC)   Clinical depression   H/O deep venous thrombosis   Hyperlipidemia   Essential hypertension   History of pulmonary embolism   History of basal cell carcinoma (BCC) excision   IDA (iron deficiency anemia)  Resolved Problems:   * No resolved hospital problems. [facility] Course: Thomas [name] is a 78 year old male with history of hypertension, history of DVT presumed secondary to Antithrombin III deficiency, on warfarin, history of hypertension, iron deficiency anemia, who presented to the ER with abdominal pain and vomiting. He had not been eating well at home before multiple days due to worsening abdominal distention and pain.  He had been still taking Coumadin .   CT abdomen/pelvis showed apple core lesion within the proximal transverse colon measuring 4.3 cm in length concerning for colonic malignancy and high-grade bowel obstruction due to this. General surgery was consulted and patient was admitted for further workup.   INR was markedly elevated, 9.4 on admission and he underwent reversal with vitamin K  and FFP. He then underwent right hemicolectomy with primary anastomosis with general surgery on 12/01/2023.  Assessment and Plan: * Large bowel obstruction (HCC) Secondary to apple core lesion within the proximal transverse colon General Surgery following, s/p right hemicolectomy with primary anastomosis on 12/01/2023. He was on NG tube feeds  later changed to TPN per surgical recs. He was given antiemetics. Patient did tolerate liquids, advanced to soft diet.  TPN discontinued 12/09/2023. He wishes to go home. Surgery team advised outpatient follow up.   Nausea and vomiting Post op ileus General Surgery advised to advance diet to soft. He is able to tolerate, wishes to go home. Follow surgery team as outpatient.   Antithrombin 3 deficiency (HCC) Supra therapeutic INR. Initial INR 9.4 with peak at 10.4; received 10 mg vitamin K  and 2 units FFP S/p reversal and surgery on 1/1 Pharmacy on board placed him on heparin  drip per protocol.  INR subtherapeutic now around 1.3. Patient seen by hematology oncologist  recommended transitioning to Coumadin  with Lovenox  bridge upon discharge with hematology outpatient follow up. Script for Lovenox  sent to pharmacy, he is able to take shots prior.   Carcinoma of transverse colon [facility]) Follow-up surgical pathology Hematology/oncology is going to follow him for pathology.   Essential hypertension BP stable, advised to continue home dose Norvasc .   Hyperlipidemia Continue Lipitor.   H/O deep venous thrombosis Coumadin  with Lovenox  bridging for next 5 days, advised INR check for further management of anticoagulation. He understands. New scripts sent to pharmacy.       Consultants: Surgery Procedures performed: S/p right hemicolectomy with primary anastomosis on 12/01/2023   Disposition: Home Diet recommendation:  Discharge Diet Orders (From admission, onward)     Start     Ordered   12/09/23 0000  Diet - low sodium heart healthy        12/09/23 1328           Cardiac diet DISCHARGE MEDICATION: Allergies as  of 12/09/2023       Reactions   Dexamethasone Other (See Comments)   Renal Insufficiency        Medication List     STOP taking these medications    DAYQUIL/NYQUIL COLD/FLU RELIEF PO       TAKE these medications    amLODipine  2.5 MG tablet Commonly known  as: NORVASC  Take 1 tablet (2.5 mg total) by mouth daily.   atorvastatin  10 MG tablet Commonly known as: LIPITOR Take 1 tablet (10 mg total) by mouth daily.   enoxaparin  80 MG/0.8ML injection Commonly known as: LOVENOX  Inject 0.8 mLs (80 mg total) into the skin every 12 (twelve) hours for 10 days. Do NOT expel air bubble from syringe before giving.   EQ Severe Allergy & Sinus 25-5-325 MG Tabs Generic drug: diphenhydrAMINE-PE-APAP Take 1 tablet by mouth as needed.   ferrous sulfate 325 (65 FE) MG EC tablet Take 325 mg by mouth daily with breakfast.   warfarin 5 MG tablet Commonly known as: COUMADIN  Take as directed. If you are unsure how to take this medication, talk to your nurse or doctor. Original instructions: Take 1 tablet (5 mg total) by mouth daily.        Follow-up Information     Rodolph Romano, MD. Go on 12/16/2023.   Specialty: General Surgery Why: Follow up after right colectomy. Go at 9:30am. Contact information: [address] [place] [address] [phone]         Melvin Pao, NP Follow up in 1 week(s).   Specialty: Nurse Practitioner Contact information: [address] [place] [address] [phone]         Babara Call, MD Follow up in 2 week(s).   Specialty: Oncology Contact information: [address] [place] [address] [phone]                Discharge Exam: Thomas [name]   11/30/23 1517 12/06/23 0500 12/08/23 0500  Weight: 70.8 kg 69.1 kg 77.9 kg   General - Elderly Caucasian male, no apparent distress HEENT - PERRLA, EOMI, atraumatic head, non tender sinuses. Lung - Clear, no rales, rhonchi, wheezes. Heart - S1, S2 heard, no murmurs, rubs, trace pedal edema. Abdomen-soft, nontender, nondistended, midline sutures intact. Neuro - Alert, awake and oriented x 3, non focal exam. Skin - Warm and dry.  Condition at discharge: stable  The results of significant diagnostics from this  hospitalization (including imaging, microbiology, ancillary and laboratory) are listed below for reference.   Imaging Studies: CT ABDOMEN PELVIS W CONTRAST Result Date: 12/07/2023 CLINICAL DATA:  Large bowel obstruction suspected EXAM: CT ABDOMEN AND PELVIS WITH CONTRAST TECHNIQUE: Multidetector CT imaging of the abdomen and pelvis was performed using the standard protocol following bolus administration of intravenous contrast. RADIATION DOSE REDUCTION: This exam was performed according to the departmental dose-optimization program which includes automated exposure control, adjustment of the mA and/or kV according to patient size and/or use of iterative reconstruction technique. CONTRAST:  OMNIPAQUE  IOHEXOL  300 MG/ML  SOLN COMPARISON:  11/30/2023 FINDINGS: Lower chest: Hiatal hernia again demonstrated. Redemonstration of widespread pulmonary markings in the right lower lung including some tree in bud opacities. Findings may be slightly more pronounced than were seen previously. This could go along with atypical infection. No dense consolidation or collapse. Hepatobiliary: No significant liver finding. Tiny cyst in the medial segment of the left lobe. Small stones in the gallbladder as seen previously without evidence of inflammation or obstruction. Pancreas: Normal Spleen: Normal Adrenals/Urinary  Tract: Adrenal glands are normal. Bilateral simple renal cysts that do not require further follow-up. Nonobstructing 3-4 mm stone in the upper pole of the left kidney. No hydronephrosis or passing stone. No stone in the bladder. Stomach/Bowel: Interval laparotomy and resection of a right colon mass. Postoperative free fluid and air. No evidence of extravasated contrast. Ileus pattern of the small intestine. Cannot rule out some degree of anastomotic obstruction at the small bowel large bowel anastomosis secondary to swelling. Vascular/Lymphatic: Aortic atherosclerosis. No aneurysm. IVC is normal. No adenopathy.  Reproductive: Normal Other: No loculated collection to suggest abscess at this time. Musculoskeletal: No significant finding. IMPRESSION: 1. Interval laparotomy and resection of a right colon mass. Postoperative free fluid and air. No evidence of extravasated contrast. Ileus pattern of the small intestine. Cannot exclude some degree of anastomotic obstruction at the small bowel large bowel anastomosis secondary to swelling. No loculated collection to suggest abscess at this time. 2. Cholelithiasis without evidence of inflammation or obstruction. 3. Nonobstructing 3-4 mm stone in the upper pole of the left kidney. 4. Aortic atherosclerosis. 5. Redemonstration of widespread pulmonary markings in the right lower lung including some tree in bud opacities. Findings may be slightly more pronounced than were seen previously. This could go along with atypical infection. No dense consolidation or collapse. Aortic Atherosclerosis (ICD10-I70.0). Electronically Signed   By: Oneil Officer M.D.   On: 12/07/2023 14:28   DG Abd 2 Views Result Date: 12/05/2023 CLINICAL DATA:  Ileus following abdominal surgery. EXAM: ABDOMEN - 2 VIEW COMPARISON:  Abdominal radiograph dated 12/04/2023. FINDINGS: Enteric contrast overlies the rectum. Multiple dilated loops of bowel are seen throughout the abdomen, similar in extent to prior exam. An enteric tube tip overlies the stomach with the side port at the gastroesophageal junction. IMPRESSION: Enteric contrast overlies the rectum. Multiple dilated loops of bowel throughout the abdomen, similar in extent to prior exam. These findings likely reflect ileus. Electronically Signed   By: Norman Hopper M.D.   On: 12/05/2023 17:56   US  EKG SITE RITE Result Date: 12/05/2023 If Site Rite image not attached, placement could not be confirmed due to current cardiac rhythm.  DG Abd Portable 1V-Small Bowel Obstruction Protocol-initial, 8 hr delay Result Date: 12/04/2023 CLINICAL DATA:  8 hour small-bowel  follow-through EXAM: PORTABLE ABDOMEN - 1 VIEW COMPARISON:  12/03/2023 FINDINGS: Gastric catheter is noted within the stomach. Administered contrast lies predominately within dilated loops of small bowel. No colonic contrast is seen at this time. No free air is noted. IMPRESSION: Administered contrast lies in the dilated small bowel. 24 hour follow-up film is recommended. Electronically Signed   By: Oneil Devonshire M.D.   On: 12/04/2023 20:47   DG Abd 1 View Result Date: 12/03/2023 CLINICAL DATA:  Ileus.  Status post GI surgery. EXAM: ABDOMEN - 1 VIEW COMPARISON:  X-ray 11/30/2023 and CT scan FINDINGS: Enteric tube in place with the tip beneath the diaphragm but side hole at the GE junction. Recommend this be advanced further into the stomach. Midline skin staples are seen along the abdomen and pelvis. There is some air towards the rectum along the colon. However there are some distended air-filled loops of bowel in the upper abdomen including what may be small bowel which are dilated up to 4 cm. Patient is postop in this very well could be ileus rather than a partial obstruction. Recommend continued follow up. Degenerative changes of the spine and pelvis. IMPRESSION: Postop changes. Dilated loops small bowel in the upper abdomen as  well as air in distended colon. This could very well could be ileus rather than obstruction with patient being postop. Recommend continued follow-up. NG tube in place but side hole above the diaphragm. This could be advanced further into the stomach. Electronically Signed   By: Ranell Bring M.D.   On: 12/03/2023 18:15   DG Abd Portable 1 View Result Date: 12/01/2023 CLINICAL DATA:  ng tube EXAM: PORTABLE ABDOMEN - 1 VIEW COMPARISON:  CT abdomen pelvis 11/30/2023 FINDINGS: Enteric tube courses inferiorly along the mediastinum with a hairpin loop at the lower mediastinum and tip terminating cranially at the level of the clavicles. The heart and mediastinal contours are within normal  limits. No focal consolidation. Coarsened interstitial markings. No pleural effusion. No pneumothorax. Multiple loops of small bowel dilated with gas. No radio-opaque calculi or other significant radiographic abnormality are seen. Aortic calcification. IMPRESSION: 1. Malpositioned enteric tube.  Recommend removal and replacement. 2. Multiple loops of small bowel dilated with gas. 3. Coarsened interstitial markings. Electronically Signed   By: Morgane  Naveau M.D.   On: 12/01/2023 03:34   CT ABDOMEN PELVIS W CONTRAST Result Date: 11/30/2023 CLINICAL DATA:  Abdominal pain for 2 days, vomiting yesterday EXAM: CT ABDOMEN AND PELVIS WITH CONTRAST TECHNIQUE: Multidetector CT imaging of the abdomen and pelvis was performed using the standard protocol following bolus administration of intravenous contrast. RADIATION DOSE REDUCTION: This exam was performed according to the departmental dose-optimization program which includes automated exposure control, adjustment of the mA and/or kV according to patient size and/or use of iterative reconstruction technique. CONTRAST:  OMNIPAQUE  IOHEXOL  300 MG/ML  SOLN COMPARISON:  None Available. FINDINGS: Lower chest: Diffuse tree in bud ground-glass airspace disease throughout the right middle and right lower lobe may reflect inflammation, infection, or sequela of aspiration given history of vomiting. Small hiatal hernia. Hepatobiliary: Cholelithiasis and gallbladder sludge without evidence of acute cholecystitis. The liver is unremarkable. No biliary duct dilation. Pancreas: Unremarkable. No pancreatic ductal dilatation or surrounding inflammatory changes. Spleen: Normal in size without focal abnormality. Adrenals/Urinary Tract: 4 mm nonobstructing calculus upper pole left kidney. 2 mm nonobstructing calculus lower pole right kidney. Bilateral simple renal cortical cysts do not require imaging follow-up. There are bilateral indeterminate adrenal nodules, measuring 1 cm on the  right with attenuation of 115 HU, measuring 0.9 cm on the left with attenuation 98 HU. Bladder is unremarkable. Stomach/Bowel: There is an apple-core lesion within the proximal transverse colon measuring 4.3 cm in length consistent with colonic malignancy. There is functional obstruction as result of this mass, with marked distension of the ascending colon and small bowel. Numerous gas fluid levels are seen throughout the dilated small bowel and proximal colon. Normal appendix central upper abdomen. No acute inflammatory changes. Vascular/Lymphatic: Aortic atherosclerosis. No enlarged abdominal or pelvic lymph nodes. Reproductive: Prostate is enlarged measuring 5.7 x 4.7 cm, with central calcified concretions. There are bilateral hydroceles, large on the right and small on the left, incompletely evaluated. Other: Small volume ascites within the lower pelvis. No free intraperitoneal gas. No abdominal wall hernia. Musculoskeletal: No acute or destructive bony abnormalities. Reconstructed images demonstrate no additional findings. IMPRESSION: 1. Apple-core lesion within the proximal transverse colon measuring 4.3 cm in length, consistent with colonic malignancy. 2. High-grade bowel obstruction as result of the transverse colon mass, with marked distension of the small bowel and proximal colon. Surgical consultation recommended. 3. Cholelithiasis and gallbladder sludge. No evidence of acute cholecystitis. 4. Indeterminate bilateral adrenal masses, probably benign adenomas. Recommend follow-up adrenal  washout CT in 1 year. If stable for = 1 year, no further follow-up imaging. JACR 2017 Aug; 14(8):1038-44, JCAT 2016 Mar-Apr; 40(2):194-200, Urol J 2006 Spring; 3(2):71-4. 5. Tree in bud ground-glass airspace disease throughout the visualized right lung, which may reflect aspiration, infection, or inflammation. 6. Small hiatal hernia. 7. Minimal pelvic ascites. 8. Bilateral hydroceles, right larger than left. 9. Enlarged  prostate. 10.  Aortic Atherosclerosis (ICD10-I70.0). Electronically Signed   By: Ozell Daring M.D.   On: 11/30/2023 19:02    Microbiology: No results found for this or any previous visit.  Labs: CBC: Recent Labs  Lab 12/05/23 0616 12/06/23 0509 12/07/23 0540 12/08/23 0113 12/09/23 0525  WBC 7.4 7.2 12.0* 14.5* 10.8*  NEUTROABS 4.8 4.7 9.2* 11.7* 8.8*  HGB 11.3* 10.5* 10.9* 11.0* 10.1*  HCT 35.4* 33.2* 34.7* 34.2* 31.2*  MCV 85.9 84.5 86.5 84.4 86.4  PLT 212 201 200 183 149*   Basic Metabolic Panel: Recent Labs  Lab 12/05/23 0616 12/06/23 0509 12/07/23 0540 12/08/23 0113 12/09/23 0525  NA 141 143 142 137 134*  K 3.7 3.0* 3.2* 3.7 3.7  CL 109 111 111 109 109  CO2 18* 23 22 20* 18*  GLUCOSE 74 166* 143* 116* 117*  BUN 25* 23 22 22 23   CREATININE 0.81 0.78 0.63 0.64 0.70  CALCIUM  8.0* 7.8* 8.1* 8.0* 7.7*  MG 2.2 2.0 2.0 2.0 2.0  PHOS  --  2.3* 3.3 2.9 3.2   Liver Function Tests: Recent Labs  Lab 12/06/23 0509 12/09/23 0525  AST 19 30  ALT 17 22  ALKPHOS 41 41  BILITOT 0.5 0.3  PROT 5.9* 5.5*  ALBUMIN 2.5* 2.2*   CBG: Recent Labs  Lab 12/08/23 1135 12/08/23 1640 12/08/23 2240 12/09/23 0619 12/09/23 1118  GLUCAP 102* 105* 118* 109* 102*    Discharge time spent: 36 minutes.  Signed: Concepcion Riser, MD [facility] 12/09/2023

## 2023-12-09 NOTE — TOC Transition Note (Signed)
 Transition of Care [facility]) - Discharge Note   Patient Details  Name: Thomas [name] MRN: [id number] Date of Birth: 12/21/1945  Transition of Care [facility]) CM/SW Contact:  Corean ONEIDA Haddock, RN Phone Number: 12/09/2023, 1:50 PM   Clinical Narrative:    Patient to discharge today Patient confirms he is is still not interested in DME or home health      Barriers to Discharge: Continued Medical Work up   Patient Goals and CMS Choice Patient states their goals for this hospitalization and ongoing recovery are:: declines home health and rolling walker CMS Medicare.gov Compare Post Acute Care list provided to:: Patient Choice offered to / list presented to : Patient      Discharge Placement                       Discharge Plan and Services Additional resources added to the After Visit Summary for                                       Social Drivers of Health (SDOH) Interventions SDOH Screenings   Food Insecurity: No Food Insecurity (12/01/2023)  Housing: Low Risk  (12/01/2023)  Transportation Needs: No Transportation Needs (12/01/2023)  Utilities: Not At Risk (12/01/2023)  Depression (PHQ2-9): Low Risk  (08/26/2023)  Financial Resource Strain: Low Risk  (01/31/2021)  Physical Activity: Inactive (01/31/2021)  Social Connections: Socially Isolated (12/04/2023)  Stress: No Stress Concern Present (01/31/2021)  Tobacco Use: Medium Risk (12/01/2023)     Readmission Risk Interventions     No data to display

## 2023-12-09 NOTE — Progress Notes (Signed)
 Patient ID: Thomas [name], male   DOB: 06/08/1946, 78 y.o.   MRN: [id number]     SURGICAL PROGRESS NOTE   Hospital Day(s): 9.   Interval History: Patient seen and examined, no acute events or new complaints overnight. Patient reports he is upset and wants to go home today.  Patient endorses that he had an accident of bowel movement and he did not receive help in a timely manner and he wants to go home.  He is very appreciative of the care from the surgical standpoint but he is upset with the nursing staff and was to go home today.  I discussed with patient that he is recovering adequately and he is almost ready to go home but we need to decrease the TPN and change his anticoagulation to oral anticoagulation.  He is requesting to do everything to be able to go home today  Vital signs in last 24 hours: [min-max] current  Temp:  [98.3 F (36.8 C)-99.5 F (37.5 C)] 98.3 F (36.8 C) (01/09 0413) Pulse Rate:  [82-94] 86 (01/09 0413) Resp:  [16-18] 17 (01/09 0413) BP: (116-131)/(55-59) 117/55 (01/09 0413) SpO2:  [91 %-93 %] 91 % (01/09 0413)     Height: 6' (182.9 cm) Weight: 77.9 kg BMI (Calculated): 23.29   Physical Exam:  Constitutional: alert, cooperative and no distress  Respiratory: breathing non-labored at rest  Cardiovascular: regular rate and sinus rhythm  Gastrointestinal: soft, non-tender, and non-distended  Labs:     Latest Ref Rng & Units 12/09/2023    5:25 AM 12/08/2023    1:13 AM 12/07/2023    5:40 AM  CBC  WBC 4.0 - 10.5 K/uL 10.8  14.5  12.0   Hemoglobin 13.0 - 17.0 g/dL 89.8  88.9  89.0   Hematocrit 39.0 - 52.0 % 31.2  34.2  34.7   Platelets 150 - 400 K/uL 149  183  200       Latest Ref Rng & Units 12/09/2023    5:25 AM 12/08/2023    1:13 AM 12/07/2023    5:40 AM  CMP  Glucose 70 - 99 mg/dL 882  883  856   BUN 8 - 23 mg/dL 23  22  22    Creatinine 0.61 - 1.24 mg/dL 9.29  9.35  9.36   Sodium 135 - 145 mmol/L 134  137  142   Potassium 3.5 - 5.1 mmol/L 3.7  3.7  3.2    Chloride 98 - 111 mmol/L 109  109  111   CO2 22 - 32 mmol/L 18  20  22    Calcium  8.9 - 10.3 mg/dL 7.7  8.0  8.1   Total Protein 6.5 - 8.1 g/dL 5.5     Total Bilirubin 0.0 - 1.2 mg/dL 0.3     Alkaline Phos 38 - 126 U/L 41     AST 15 - 41 U/L 30     ALT 0 - 44 U/L 22       Imaging studies: No new pertinent imaging studies   Assessment/Plan:  78 y.o. male with mass of the transverse colon with obstruction 8 Day Post-Op s/p extended right hemicolectomy, complicated by pertinent comorbidities including Antithrombin III deficiency.    -Continue slow adequate recovery -Hemoglobin continued to be stable.  No sign of bleeding.  Make incision to oral anticoagulation.  Contact hematology to decide which is going to be the oral anticoagulation of choice -Postoperative ileus resolved, may discontinue TPN.  Advance diet to soft diet -Continue pain  management -Appreciate Hematology/Oncology consult -Encouraged the patient to ambulate.  Continue PT/OT therapies  Thomas Petrin, MD

## 2023-12-09 NOTE — Progress Notes (Signed)
 PT Cancellation Note  Patient Details Name: Thomas [name] MRN: [id number] DOB: 1946-02-16   Cancelled Treatment:    Reason Eval/Treat Not Completed: Other (comment) (Chart reviewed, treatment attempted. Pt in the most polite of terms, informed dino that he does not intend to walk any more, he'll walk when he gets home. Chartered Loss Adjuster urges an alternate thought process, but is unsuccessful.)  10:14 AM, 12/09/23 [name] Linear, PT, DPT Physical Therapist - [facility]  [phone] (ASCOM)    Emit Kuenzel C 12/09/2023, 10:14 AM

## 2023-12-09 NOTE — Discharge Instructions (Signed)
  Diet: Resume home heart healthy regular diet.   Activity: No heavy lifting >20 pounds (children, pets, laundry, garbage) or strenuous activity until follow-up, but light activity and walking are encouraged. Do not drive or drink alcohol if taking narcotic pain medications.  Wound care: May shower with soapy water and pat dry (do not rub incisions), but no baths or submerging incision underwater until follow-up. (no swimming)   Call office [phone]) at any time if any questions, worsening pain, fevers/chills, bleeding, drainage from incision site, or other concerns.

## 2023-12-09 NOTE — Plan of Care (Signed)
  Problem: Education: Goal: Knowledge of General Education information will improve Description: Including pain rating scale, medication(s)/side effects and non-pharmacologic comfort measures Outcome: Adequate for Discharge   Problem: Health Behavior/Discharge Planning: Goal: Ability to manage health-related needs will improve Outcome: Adequate for Discharge   Problem: Clinical Measurements: Goal: Ability to maintain clinical measurements within normal limits will improve Outcome: Adequate for Discharge Goal: Will remain free from infection Outcome: Adequate for Discharge Goal: Diagnostic test results will improve Outcome: Adequate for Discharge Goal: Respiratory complications will improve Outcome: Adequate for Discharge Goal: Cardiovascular complication will be avoided Outcome: Adequate for Discharge   Problem: Activity: Goal: Risk for activity intolerance will decrease Outcome: Adequate for Discharge   Problem: Nutrition: Goal: Adequate nutrition will be maintained Outcome: Adequate for Discharge   Problem: Coping: Goal: Level of anxiety will decrease Outcome: Adequate for Discharge   Problem: Elimination: Goal: Will not experience complications related to bowel motility Outcome: Adequate for Discharge Goal: Will not experience complications related to urinary retention Outcome: Adequate for Discharge   Problem: Pain Management: Goal: General experience of comfort will improve Outcome: Adequate for Discharge   Problem: Safety: Goal: Ability to remain free from injury will improve Outcome: Adequate for Discharge   Problem: Skin Integrity: Goal: Risk for impaired skin integrity will decrease Outcome: Adequate for Discharge   Pt aox4, respirations even and unlabored on RA. Pt has all belongings. Pt has received all DC instructions. Pt being taken home by [employer]. Pt taken down to lobby with staff via wheelchair

## 2023-12-09 NOTE — Progress Notes (Signed)
 PT Cancellation Note  Patient Details Name: Thomas [name] MRN: [id number] DOB: Apr 02, 1946   Cancelled Treatment:    Reason Eval/Treat Not Completed: Other (comment) (Attempted PT on ce again- pt continues to refuse to participate. He 'rude nurses' are the. He explains he will 'walk out of here like a man' once we permit it. Pt thanks author for his kind efforts and wished him a nice day.)  12:43 PM, 12/09/23 [name] Linear, PT, DPT Physical Therapist - [facility]  [phone] (ASCOM)    Iris Hairston C 12/09/2023, 12:42 PM

## 2023-12-09 NOTE — Progress Notes (Signed)
 PHARMACY - ANTICOAGULATION CONSULT NOTE  Pharmacy Consult for heparin  infusion Indication: antithrombin III deficiency  Allergies  Allergen Reactions   Dexamethasone Other (See Comments)    Renal Insufficiency    Patient Measurements: Height: 6' (182.9 cm) Weight: 77.9 kg (171 lb 11.8 oz) IBW/kg (Calculated) : 77.6 Heparin  Dosing Weight: 70.8 kg  Vital Signs: Temp: 98.3 F (36.8 C) (01/09 0413) Temp Source: Oral (01/08 2030) BP: 117/55 (01/09 0413) Pulse Rate: 86 (01/09 0413)  Labs: Recent Labs    12/07/23 0540 12/07/23 1507 12/08/23 0113 12/08/23 1007 12/08/23 2028 12/09/23 0525  HGB 10.9*  --  11.0*  --   --  10.1*  HCT 34.7*  --  34.2*  --   --  31.2*  PLT 200  --  183  --   --  149*  HEPARINUNFRC 0.24*   < > 0.39 0.23* 0.50 0.60  CREATININE 0.63  --  0.64  --   --  0.70   < > = values in this interval not displayed.    Estimated Creatinine Clearance: 84.9 mL/min (by C-G formula based on SCr of 0.7 mg/dL).   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  He is on warfarin prior to admission (last INR 1.8) and is being placed on IV heparin  until warfarin can be resumed   1/7 0540 HL 0.24 Subtherapeutic 1/7 1507 HL 0.18 Subtherapeutic 1/8 0113 HL 0.39 Therapeutic x 1 1/8 1007 HL 0.23 Subtherapeutic 1/8 2028 HL 0.50 Therapeutic x 1 1/8 0525 HL  0.60 Therapeutic x 2  No issues with infusion reported, no signs/symptoms of bleeding noted.  Goal of Therapy:  Heparin  level 0.3-0.7 units/ml Monitor platelets by anticoagulation protocol: Yes   Plan:  Continue heparin  infusion 2050 units/hr Recheck HL daily w/ AM labs while therapeutic Continue to monitor H&H and platelets  Thank you for involving pharmacy in this patient's care.   Rankin CANDIE Dills, PharmD, MBA 12/09/2023 7:01 AM

## 2023-12-09 NOTE — Progress Notes (Signed)
 Pt is upset and wants to leave the hospital today. MD aware, Pharmacy aware. See new orders.

## 2023-12-10 ENCOUNTER — Telehealth: Payer: Self-pay

## 2023-12-10 NOTE — Transitions of Care (Post Inpatient/ED Visit) (Signed)
   12/10/2023  Name: Thomas [name] MRN: [id number] DOB: [birth year]  Today's TOC FU Call Status: Today's TOC FU Call Status:: Unsuccessful Call (1st Attempt) Unsuccessful Call (1st Attempt) Date: 12/10/23  Attempted to reach the patient regarding the most recent Inpatient/ED visit. LM with contact info and a request to return call   Follow Up Plan: Additional outreach attempts will be made to reach the patient to complete the Transitions of Care (Post Inpatient/ED visit) call.    Bari Mayans , BSN, RN Care Management Coordinator [place]   [facility] [email or web address] Direct Dial: [phone]

## 2023-12-13 ENCOUNTER — Telehealth: Payer: Self-pay

## 2023-12-13 NOTE — Transitions of Care (Post Inpatient/ED Visit) (Signed)
   12/13/2023  Name: Thomas [name] MRN: [id number] DOB: 1946/05/20  Today's TOC FU Call Status: Today's TOC FU Call Status:: Unsuccessful Call (2nd Attempt) Unsuccessful Call (1st Attempt) Date: 12/10/23 Unsuccessful Call (2nd Attempt) Date: 12/13/23  Attempted to reach the patient regarding the most recent Inpatient/ED visit. Patient was called in an Outreach attempt to offer VBCI  30-day TOC program. Pt is eligible for program due to potential risk for readmission and/or high utilization. Unfortunately, I was not able to speak with the patient in regards to recent hospital discharge  Left message for patient including VBCI CM contact information with request for a call back in regard to recent hospital discharge   Follow Up Plan: Additional outreach attempts will be made to reach the patient to complete the Transitions of Care (Post Inpatient/ED visit) call.    Bari Mayans , BSN, RN Care Management Coordinator [place]   [facility] [email or web address] Direct Dial: [phone]

## 2023-12-14 ENCOUNTER — Telehealth: Payer: Self-pay

## 2023-12-14 NOTE — Transitions of Care (Post Inpatient/ED Visit) (Signed)
   12/14/2023  Name: SABEN DONIGAN MRN: [id number] DOB: August 02, 1946  Today's TOC FU Call Status: Today's TOC FU Call Status:: Unsuccessful Call (3rd Attempt) Unsuccessful Call (1st Attempt) Date: 12/10/23 Unsuccessful Call (2nd Attempt) Date: 12/13/23 Unsuccessful Call (3rd Attempt) Date: 12/14/23  Attempted to reach the patient regarding the most recent Inpatient/ED visit. Patient was called in an Outreach attempt to offer VBCI  30-day TOC program. Pt is eligible for program due to potential risk for readmission and/or high utilization. Unfortunately, I was not able to speak with the patient in regards to recent hospital discharge  Left message for patient including VBCI CM contact information with request for a call back in regard to recent hospital discharge   Follow Up Plan: No further outreach attempts will be made at this time. We have been unable to contact the patient.  Bari Mayans , BSN, RN Care Management Coordinator [place]   [facility] [email or web address] Direct Dial: [phone]

## 2023-12-23 ENCOUNTER — Inpatient Hospital Stay: Payer: 59 | Admitting: Oncology

## 2023-12-23 ENCOUNTER — Telehealth: Payer: Self-pay

## 2023-12-23 NOTE — Telephone Encounter (Signed)
Called Mr. Ronchetti with concerns over him cancelling appointment today with Dr. Cathie Hoops. He stated he appreciated the call but he was adamant that he did not want to reschedule and that "he knew what he had to do". He was very concerned about his brother that has been in the hospital for 10 days following a fall. He was encouraged to reschedule to just come in and have a conversation about his pathology and the recommendations. He again stated he appreciates our concern but he did not want to come. I encouraged if to call and reschedule if he changes his mind.

## 2024-01-18 ENCOUNTER — Encounter: Payer: Self-pay | Admitting: Nurse Practitioner

## 2024-01-18 ENCOUNTER — Ambulatory Visit: Payer: 59 | Admitting: Nurse Practitioner

## 2024-01-18 VITALS — BP 120/68 | HR 85 | Ht 72.0 in | Wt 146.0 lb

## 2024-01-18 DIAGNOSIS — Z7901 Long term (current) use of anticoagulants: Secondary | ICD-10-CM | POA: Diagnosis not present

## 2024-01-18 DIAGNOSIS — D6859 Other primary thrombophilia: Secondary | ICD-10-CM | POA: Diagnosis not present

## 2024-01-18 LAB — COAGUCHEK XS/INR WAIVED
INR: 3.2 — ABNORMAL HIGH (ref 0.9–1.1)
Prothrombin Time: 38.4 s

## 2024-01-18 NOTE — Progress Notes (Signed)
BP 120/68 (BP Location: Right Arm, Patient Position: Sitting, Cuff Size: Normal)   Pulse 85   Ht 6' (1.829 m)   Wt 146 lb (66.2 kg)   SpO2 98%   BMI 19.80 kg/m    Subjective:    Patient ID: Thomas [name], male    DOB: September 15, 1946, 78 y.o.   MRN: [id number]  CC: Coumadin management  HPI: This patient is a 78 y.o. male who presents for coumadin management. The expected duration of coumadin treatment is lifelong The reason for anticoagulation is   Antithrombinn .  Present Coumadin dose: He was taking Lovenox after hospitalization.  He was on Lovenox until about a week ago then restarted coumadin.  Goal: 2.0-3.0 2.0-3.0 PT: 27.2 INR:3.2 Excessive bruising: no Nose bleeding: no Rectal bleeding: no Prolonged menstrual cycles: N/A Eating diet with consistent amounts of foods containing Vitamin K:yes Any recent antibiotic use? no  He had a bowel resection secondary to colon cancer.  He has elected not to have any more intervention.   Relevant past medical, surgical, family and social history reviewed and updated as indicated. Interim medical history since our last visit reviewed. Allergies and medications reviewed and updated.  ROS: Per HPI unless specifically indicated above     Objective:    BP 120/68 (BP Location: Right Arm, Patient Position: Sitting, Cuff Size: Normal)   Pulse 85   Ht 6' (1.829 m)   Wt 146 lb (66.2 kg)   SpO2 98%   BMI 19.80 kg/m   Wt Readings from Last 3 Encounters:  01/18/24 146 lb (66.2 kg)  12/08/23 171 lb 11.8 oz (77.9 kg)  11/02/23 159 lb 12.8 oz (72.5 kg)     General: Well appearing, well nourished in no distress.  Normal mood and affect. Skin: No excessive bruising or rash  Last INR:     Last CBC:  Lab Results  Component Value Date   WBC 10.8 (H) 12/09/2023   HGB 10.1 (L) 12/09/2023   HCT 31.2 (L) 12/09/2023   MCV 86.4 12/09/2023   PLT 149 (L) 12/09/2023    Results for orders placed or performed during the hospital encounter  of 11/30/23  Lipase, blood   Collection Time: 11/30/23  3:19 PM  Result Value Ref Range   Lipase 73 (H) 11 - 51 U/L  Comprehensive metabolic panel   Collection Time: 11/30/23  3:19 PM  Result Value Ref Range   Sodium 134 (L) 135 - 145 mmol/L   Potassium 3.4 (L) 3.5 - 5.1 mmol/L   Chloride 93 (L) 98 - 111 mmol/L   CO2 28 22 - 32 mmol/L   Glucose, Bld 118 (H) 70 - 99 mg/dL   BUN 46 (H) 8 - 23 mg/dL   Creatinine, Ser 4.85 0.61 - 1.24 mg/dL   Calcium 9.1 8.9 - 46.2 mg/dL   Total Protein 7.3 6.5 - 8.1 g/dL   Albumin 3.7 3.5 - 5.0 g/dL   AST 29 15 - 41 U/L   ALT 14 0 - 44 U/L   Alkaline Phosphatase 50 38 - 126 U/L   Total Bilirubin 0.8 0.0 - 1.2 mg/dL   GFR, Estimated >70 >35 mL/min   Anion gap 13 5 - 15  CBC   Collection Time: 11/30/23  3:19 PM  Result Value Ref Range   WBC 7.0 4.0 - 10.5 K/uL   RBC 4.99 4.22 - 5.81 MIL/uL   Hemoglobin 13.7 13.0 - 17.0 g/dL   HCT 00.9 38.1 - 82.9 %  MCV 83.0 80.0 - 100.0 fL   MCH 27.5 26.0 - 34.0 pg   MCHC 33.1 30.0 - 36.0 g/dL   RDW 10.9 60.4 - 54.0 %   Platelets 268 150 - 400 K/uL   nRBC 0.0 0.0 - 0.2 %  Prepare fresh frozen plasma   Collection Time: 11/30/23  9:58 PM  Result Value Ref Range   Unit Number [id number]    Blood Component Type THW PLS APHR    Unit division B0    Status of Unit ISSUED,FINAL    Transfusion Status OK TO TRANSFUSE    Unit Number [id number]    Blood Component Type THW PLS APHR    Unit division 00    Status of Unit ISSUED,FINAL    Transfusion Status      OK TO TRANSFUSE Performed at [facility], [address] [place], [place] [address]   BPAM FFP   Collection Time: 11/30/23  9:58 PM  Result Value Ref Range   ISSUE DATE / TIME [id number]    Blood Product Unit Number [id number]    PRODUCT CODE E2121VB0    Unit Type and Rh 5100    Blood Product Expiration Date 956387564332    ISSUE DATE / TIME 951884166063    Blood Product Unit Number [id number]    PRODUCT CODE E2121V00     Unit Type and Rh 5100    Blood Product Expiration Date 322025427062   Protime-INR   Collection Time: 11/30/23 11:18 PM  Result Value Ref Range   Prothrombin Time 76.3 (H) 11.4 - 15.2 seconds   INR 9.4 (HH) 0.8 - 1.2  Magnesium   Collection Time: 11/30/23 11:18 PM  Result Value Ref Range   Magnesium 2.3 1.7 - 2.4 mg/dL  Type and screen [facility]   Collection Time: 11/30/23 11:18 PM  Result Value Ref Range   ABO/RH(D) O POS    Antibody Screen NEG    Sample Expiration      12/03/2023,2359 Performed at [facility], [address] [place], [place] [address]   Urinalysis, Routine w reflex microscopic -Urine, Clean Catch   Collection Time: 11/30/23 11:21 PM  Result Value Ref Range   Color, Urine YELLOW (A) YELLOW   APPearance CLEAR (A) CLEAR   Specific Gravity, Urine >1.046 (H) 1.005 - 1.030   pH 5.0 5.0 - 8.0   Glucose, UA NEGATIVE NEGATIVE mg/dL   Hgb urine dipstick NEGATIVE NEGATIVE   Bilirubin Urine NEGATIVE NEGATIVE   Ketones, ur 5 (A) NEGATIVE mg/dL   Protein, ur NEGATIVE NEGATIVE mg/dL   Nitrite NEGATIVE NEGATIVE   Leukocytes,Ua NEGATIVE NEGATIVE  Surgical pathology   Collection Time: 12/01/23 12:00 AM  Result Value Ref Range   SURGICAL PATHOLOGY      SURGICAL PATHOLOGY [facility] [address] [place], [place] [address] Telephone [phone] or [phone] Fax [phone]  REPORT OF SURGICAL PATHOLOGY   Accession #: [id number] Patient Name: Thomas [name] Visit # : [id number]  MRN: [id number] Physician: Carolan Shiver DOB/Age [birth year] (Age: 75) Gender: M Collected Date: 12/01/2023 Received Date: 12/02/2023  FINAL DIAGNOSIS       1. Colon, segmental resection, right colon :       - INVASIVE COLORECTAL ADENOCARCINOMA, MULTIFOCAL, INVOLVING THE ASCENDING COLON      AND CECUM.      - SEE CANCER SUMMARY BELOW.      - COLONIC DILATION SECONDARY TO TUMOR OBSTRUCTION.      -  APPENDIX  WITH OBLITERATION OF THE DISTAL LUMEN.       Diagnosis Note : This case underwent intradepartmental consultation and Dr.      Corey Harold concurs with the interpretation. These findings were communicated to Dr.      Hazle Quant on 12/06/2023.      DATE SIGNED OUT: 12/06/2023 ELECTRONIC SI GNATURE : Oneita Kras Md, Tara , Pathologist, Electronic Signature  MICROSCOPIC DESCRIPTION 1. CANCER CASE SUMMARY: COLON AND RECTUM  Standard(s): AJCC-UICC 8  SPECIMEN Procedure: Right hemicolectomy  TUMOR Multiple Primary Sites: Present: Ascending colon and cecum Tumor Site: Ascending colon Histologic Type: Adenocarcinoma Histologic Grade: G2, moderately differentiated Tumor Size: Greatest dimension: 7.0 cm Tumor Extent: Invades through the muscularis propria into pericolonic tissue Sub-mucosal Invasion: Not applicable (not a pT1 tumor) Macroscopic Tumor Perforation: Not identified Lymphatic and/or Vascular Invasion: Not identified Perineural Invasion: Present Tumor Budding Score: High (10 or more) Treatment Effect: No known presurgical therapy  Tumor Site: Cecum Histologic Type: Adenocarcinoma Histologic Grade: G2, moderately differentiated Tumor Size: Greatest dimension: 2.5 cm Tumor Extent: Invades through the muscularis propria into pericolonic tissue Sub- mucosal Invasion: Not applicable (not a pT1 tumor) Macroscopic Tumor Perforation: Not identified Lymphatic and/or Vascular Invasion: Not identified Perineural Invasion: Not identified Tumor Budding Score: High (10 or more) Treatment Effect: No known presurgical therapy MARGINS Margin Status for Invasive Carcinoma: All margins negative for invasive carcinoma Margins examined: Proximal, distal Margin Status for Non-Invasive Tumor: All margins negative for high-grade dysplasia/intramucosal carcinoma and low-grade dysplasia  REGIONAL LYMPH NODES Regional Lymph Nodes: All regional lymph nodes negative for tumor Number of Lymph Nodes  Examined: 13 Tumor Deposits: 0  DISTANT METASTASES Distant Site(s) Involved, if applicable: Not applicable  PATHOLOGIC STAGE CLASSIFICATION (pTNM, AJCC 8th Edition):   Modified Classification: Not applicable pT3 T suffix: (m) multiple primary synchronous tumors in a single organ pN0 pM - Not applicable  SPECIAL STUDIES Immunohistoche mistry for MMR (mismatch repair proteins) will be performed on blocks 1C and 74F and resulted in an addendum. (v4.3.1.0)  CASE COMMENTS STAINS USED IN DIAGNOSIS: H&E H&E H&E H&E H&E H&E H&E H&E H&E H&E H&E H&E H&E H&E MLH1 MSH2 MSH6 PMS2 MLH1 MSH2 MSH6 PMS2 Universal Negative Control-DAB  ADDENDUM ADDENDUM Updated [employer] Report See Report Section, Pathologist (Supplemental Report Signed 01 07 2025)   CLINICAL HISTORY  SPECIMEN(S) OBTAINED 1. Colon, segmental resection, Right Colon  SPECIMEN COMMENTS: SPECIMEN CLINICAL INFORMATION: 1. Large bowel obstruction    Gross Description 1. Specimen received: "Right:"; terminal ileum, cecum, appendix, ascending colon      Inking: Oriented anatomically.      Proximal margin: Green      Distal margin: Orange      Mesenteric margin: Blue      Terminal ileum defect: Black      Lesion A: Pink-tan and indurated with a smooth, flat surface.      Size: 2.5 x 2.0 x 0.4 cm      % bowel circumferen ce: 10%      Site: Cecum      Distances:      Proximal margin:  16.5 cm      Distal margin: 45.2 cm      Ileocecal valve: 5.7 cm      Appendiceal orifice: 9.5 cm      Depth of invasion (grossly): up to 0.4 cm; does not grossly involve the serosa      Lesion B: Red-pink, mildly polypoid, and indurated with a central 1.0 x 0.7 cm  gray ulcerated area (up to 0.1 cm deep).      Size: 7.0 x 4.5 x 1.1 cm      % bowel circumference: 100%      Site: Ascending colon      Distances:      Proximal margin:  38.9 cm      Distal margin: 7.8 cm      Mesenteric margin: 0.5 cm       Ileocecal valve: 31.0 cm      Appendiceal orifice:  41.0 cm      Depth of invasion (grossly): 1.4 cm; grossly involves the adipose tissue      Terminal Ileum:      Length: 5.0 cm      Circumference: 4.0 cm      Serosa: 0.9 x 0.3 cm transmural defect (1.8 cm from the margin) with a black      suture (inked black); tan-white and smooth with a mild amount of adherent      adipose tissue      Mucosa : Tan-gray with focal puckering at the defect site.      Wall thickness: 0.3 cm      Appendix:      Length: 6.5 cm      Diameter: up to 0.8 cm      Serosa: Gray-tan and smooth without discrete exudate.      Cut surfaces: 0.1 cm lumen diameter; 0.2 cm thick, white-tan wall with areas of      adipose tissue despoits. Discrete lesions are absenst.      Ileocecal valve: Yellow-tan, soft, lobulated cut surfaces; lesions are not      identified.      Colon:      Length: 49.5 cm      Circumference: 7.0-27.5 cm      Serosa: Gray-brown, predominantly smooth, and intact without discrete defects.      Mucosa: Gray-brown and markedly, diffusely  flattened.      Lumen: Markedly dilated at the cecum, stricture at lesion B; a moderate amount      of brown-tan, fluid fecal material is distending the lumen.      Wall: Up to 0.3 cm thick.      Mesentery: Extends up to 9.1 cm from the serosa. Abscesses, lesions, and      necrosis are not identified.      Lymph nodes: 18 possible pink- tan lymph nodes, 0.2-1.0 cm in greatest dimension      Block summary:      1A: Proximal margin, en face      1B: Distal margin, en face      1C: Lesion A (representative) full thickness, longitudinal sections (including      greatest depth of invasion)      1D-G: Lesion B (representative) full thickness, longitudinal sections      1D: Greatest depth of invasion and ulcerated area(representative)      1E: Including uninvolved proximal mucosa      64F: Including uninvolved distal mucosa      1H: Colon, uninvolved mucosa (full  thickness, longitudinal), between lesions      1I: Terminal ileum including defect (representative)      1J: Appendix (representative) including distal tip (bisected, entire)      1K-N: Lymph nodes, entirely submitted      1K: Two      1L-M: Six per block      1N: Four      AMG 12/02/2023  Report signed out from the following location(s) [facility] [address], [place] [address] CLIA #: [id number]  [facility] [address] [place], [place] [address] CLIA #: [id number]   Protime-INR   Collection Time: 12/01/23  4:10 AM  Result Value Ref Range   Prothrombin Time 82.5 (H) 11.4 - 15.2 seconds   INR 10.4 (HH) 0.8 - 1.2  Basic metabolic panel   Collection Time: 12/01/23  4:59 AM  Result Value Ref Range   Sodium 135 135 - 145 mmol/L   Potassium 3.3 (L) 3.5 - 5.1 mmol/L   Chloride 97 (L) 98 - 111 mmol/L   CO2 26 22 - 32 mmol/L   Glucose, Bld 99 70 - 99 mg/dL   BUN 42 (H) 8 - 23 mg/dL   Creatinine, Ser 3.87 0.61 - 1.24 mg/dL   Calcium 8.4 (L) 8.9 - 10.3 mg/dL   GFR, Estimated >56 >43 mL/min   Anion gap 12 5 - 15  CBC   Collection Time: 12/01/23  4:59 AM  Result Value Ref Range   WBC 5.0 4.0 - 10.5 K/uL   RBC 4.30 4.22 - 5.81 MIL/uL   Hemoglobin 11.8 (L) 13.0 - 17.0 g/dL   HCT 32.9 (L) 51.8 - 84.1 %   MCV 84.4 80.0 - 100.0 fL   MCH 27.4 26.0 - 34.0 pg   MCHC 32.5 30.0 - 36.0 g/dL   RDW 66.0 63.0 - 16.0 %   Platelets 220 150 - 400 K/uL   nRBC 0.0 0.0 - 0.2 %  ABO/Rh   Collection Time: 12/01/23  4:59 AM  Result Value Ref Range   ABO/RH(D)      O POS Performed at [facility], [address] [place], [place] [address]   Protime-INR   Collection Time: 12/01/23  7:15 AM  Result Value Ref Range   Prothrombin Time 48.4 (H) 11.4 - 15.2 seconds   INR 5.2 (HH) 0.8 - 1.2  Prepare fresh frozen plasma   Collection Time: 12/01/23  7:31 AM  Result Value Ref Range   Unit Number [id number]    Blood  Component Type THW PLS APHR    Unit division B0    Status of Unit REL FROM [facility]    Transfusion Status OK TO TRANSFUSE    Unit Number [id number]    Blood Component Type THW PLS APHR    Unit division A0    Status of Unit REL FROM [facility]    Transfusion Status OK TO TRANSFUSE   BPAM FFP   Collection Time: 12/01/23  7:31 AM  Result Value Ref Range   Blood Product Unit Number [id number]    PRODUCT CODE E2121VB0    Unit Type and Rh 9500    Blood Product Expiration Date 485462703500    Blood Product Unit Number [id number]    PRODUCT CODE E2121VA0    Unit Type and Rh 5100    Blood Product Expiration Date 678938101751   Protime-INR   Collection Time: 12/01/23 12:22 PM  Result Value Ref Range   Prothrombin Time 20.9 (H) 11.4 - 15.2 seconds   INR 1.8 (H) 0.8 - 1.2  Basic metabolic panel   Collection Time: 12/01/23  6:52 PM  Result Value Ref Range   Sodium 137 135 - 145 mmol/L   Potassium 3.3 (L) 3.5 - 5.1 mmol/L   Chloride 100 98 - 111 mmol/L   CO2 24 22 - 32 mmol/L   Glucose, Bld 111 (  H) 70 - 99 mg/dL   BUN 38 (H) 8 - 23 mg/dL   Creatinine, Ser 1.61 0.61 - 1.24 mg/dL   Calcium 7.8 (L) 8.9 - 10.3 mg/dL   GFR, Estimated >09 >60 mL/min   Anion gap 13 5 - 15  CBC   Collection Time: 12/01/23  6:52 PM  Result Value Ref Range   WBC 6.5 4.0 - 10.5 K/uL   RBC 4.67 4.22 - 5.81 MIL/uL   Hemoglobin 12.8 (L) 13.0 - 17.0 g/dL   HCT 45.4 09.8 - 11.9 %   MCV 87.8 80.0 - 100.0 fL   MCH 27.4 26.0 - 34.0 pg   MCHC 31.2 30.0 - 36.0 g/dL   RDW 14.7 82.9 - 56.2 %   Platelets 219 150 - 400 K/uL   nRBC 0.0 0.0 - 0.2 %  Protime-INR   Collection Time: 12/01/23  6:52 PM  Result Value Ref Range   Prothrombin Time 18.7 (H) 11.4 - 15.2 seconds   INR 1.5 (H) 0.8 - 1.2  Basic metabolic panel   Collection Time: 12/02/23  4:29 AM  Result Value Ref Range   Sodium 137 135 - 145 mmol/L   Potassium 3.4 (L) 3.5 - 5.1 mmol/L   Chloride 102 98 - 111 mmol/L   CO2 22 22 - 32 mmol/L   Glucose, Bld  94 70 - 99 mg/dL   BUN 29 (H) 8 - 23 mg/dL   Creatinine, Ser 1.30 0.61 - 1.24 mg/dL   Calcium 7.8 (L) 8.9 - 10.3 mg/dL   GFR, Estimated >86 >57 mL/min   Anion gap 13 5 - 15  CBC with Differential/Platelet   Collection Time: 12/02/23  4:29 AM  Result Value Ref Range   WBC 9.6 4.0 - 10.5 K/uL   RBC 4.72 4.22 - 5.81 MIL/uL   Hemoglobin 12.7 (L) 13.0 - 17.0 g/dL   HCT 84.6 96.2 - 95.2 %   MCV 84.7 80.0 - 100.0 fL   MCH 26.9 26.0 - 34.0 pg   MCHC 31.8 30.0 - 36.0 g/dL   RDW 84.1 32.4 - 40.1 %   Platelets 187 150 - 400 K/uL   nRBC 0.0 0.0 - 0.2 %   Neutrophils Relative % 85 %   Neutro Abs 8.2 (H) 1.7 - 7.7 K/uL   Lymphocytes Relative 10 %   Lymphs Abs 1.0 0.7 - 4.0 K/uL   Monocytes Relative 4 %   Monocytes Absolute 0.4 0.1 - 1.0 K/uL   Eosinophils Relative 0 %   Eosinophils Absolute 0.0 0.0 - 0.5 K/uL   Basophils Relative 0 %   Basophils Absolute 0.0 0.0 - 0.1 K/uL   WBC Morphology MORPHOLOGY UNREMARKABLE    RBC Morphology MORPHOLOGY UNREMARKABLE    Smear Review Normal platelet morphology    Immature Granulocytes 1 %   Abs Immature Granulocytes 0.05 0.00 - 0.07 K/uL  Magnesium   Collection Time: 12/02/23  4:29 AM  Result Value Ref Range   Magnesium 2.2 1.7 - 2.4 mg/dL  Heparin level (unfractionated)   Collection Time: 12/03/23 12:07 AM  Result Value Ref Range   Heparin Unfractionated 0.13 (L) 0.30 - 0.70 IU/mL  Basic metabolic panel   Collection Time: 12/03/23  5:56 AM  Result Value Ref Range   Sodium 138 135 - 145 mmol/L   Potassium 3.4 (L) 3.5 - 5.1 mmol/L   Chloride 103 98 - 111 mmol/L   CO2 22 22 - 32 mmol/L   Glucose, Bld 75 70 - 99 mg/dL  BUN 23 8 - 23 mg/dL   Creatinine, Ser 1.61 0.61 - 1.24 mg/dL   Calcium 7.7 (L) 8.9 - 10.3 mg/dL   GFR, Estimated >09 >60 mL/min   Anion gap 13 5 - 15  CBC with Differential/Platelet   Collection Time: 12/03/23  5:56 AM  Result Value Ref Range   WBC 6.9 4.0 - 10.5 K/uL   RBC 4.07 (L) 4.22 - 5.81 MIL/uL   Hemoglobin 11.2  (L) 13.0 - 17.0 g/dL   HCT 45.4 (L) 09.8 - 11.9 %   MCV 86.2 80.0 - 100.0 fL   MCH 27.5 26.0 - 34.0 pg   MCHC 31.9 30.0 - 36.0 g/dL   RDW 14.7 82.9 - 56.2 %   Platelets 175 150 - 400 K/uL   nRBC 0.0 0.0 - 0.2 %   Neutrophils Relative % 67 %   Neutro Abs 4.7 1.7 - 7.7 K/uL   Lymphocytes Relative 23 %   Lymphs Abs 1.6 0.7 - 4.0 K/uL   Monocytes Relative 8 %   Monocytes Absolute 0.5 0.1 - 1.0 K/uL   Eosinophils Relative 1 %   Eosinophils Absolute 0.1 0.0 - 0.5 K/uL   Basophils Relative 0 %   Basophils Absolute 0.0 0.0 - 0.1 K/uL   WBC Morphology Abnormal lymphocytes present    RBC Morphology MIXED RBC POPULATION    Smear Review Normal platelet morphology    Immature Granulocytes 1 %   Abs Immature Granulocytes 0.04 0.00 - 0.07 K/uL   Ovalocytes PRESENT   Magnesium   Collection Time: 12/03/23  5:56 AM  Result Value Ref Range   Magnesium 2.1 1.7 - 2.4 mg/dL  Heparin level (unfractionated)   Collection Time: 12/03/23  8:51 AM  Result Value Ref Range   Heparin Unfractionated <0.10 (L) 0.30 - 0.70 IU/mL  Heparin level (unfractionated)   Collection Time: 12/03/23 10:54 PM  Result Value Ref Range   Heparin Unfractionated 0.55 0.30 - 0.70 IU/mL  Basic metabolic panel   Collection Time: 12/04/23  4:24 AM  Result Value Ref Range   Sodium 140 135 - 145 mmol/L   Potassium 3.5 3.5 - 5.1 mmol/L   Chloride 108 98 - 111 mmol/L   CO2 18 (L) 22 - 32 mmol/L   Glucose, Bld 68 (L) 70 - 99 mg/dL   BUN 23 8 - 23 mg/dL   Creatinine, Ser 1.30 0.61 - 1.24 mg/dL   Calcium 8.0 (L) 8.9 - 10.3 mg/dL   GFR, Estimated >86 >57 mL/min   Anion gap 14 5 - 15  CBC with Differential/Platelet   Collection Time: 12/04/23  4:24 AM  Result Value Ref Range   WBC 7.1 4.0 - 10.5 K/uL   RBC 4.04 (L) 4.22 - 5.81 MIL/uL   Hemoglobin 10.9 (L) 13.0 - 17.0 g/dL   HCT 84.6 (L) 96.2 - 95.2 %   MCV 84.7 80.0 - 100.0 fL   MCH 27.0 26.0 - 34.0 pg   MCHC 31.9 30.0 - 36.0 g/dL   RDW 84.1 (H) 32.4 - 40.1 %   Platelets  193 150 - 400 K/uL   nRBC 0.0 0.0 - 0.2 %   Neutrophils Relative % 64 %   Neutro Abs 4.5 1.7 - 7.7 K/uL   Lymphocytes Relative 26 %   Lymphs Abs 1.9 0.7 - 4.0 K/uL   Monocytes Relative 7 %   Monocytes Absolute 0.5 0.1 - 1.0 K/uL   Eosinophils Relative 2 %   Eosinophils Absolute 0.1 0.0 - 0.5 K/uL  Basophils Relative 0 %   Basophils Absolute 0.0 0.0 - 0.1 K/uL   WBC Morphology MORPHOLOGY UNREMARKABLE    RBC Morphology MIXED RBC MORPHOLOGY    Smear Review Normal platelet morphology    Immature Granulocytes 1 %   Abs Immature Granulocytes 0.08 (H) 0.00 - 0.07 K/uL  Magnesium   Collection Time: 12/04/23  4:24 AM  Result Value Ref Range   Magnesium 2.0 1.7 - 2.4 mg/dL  Protime-INR   Collection Time: 12/04/23  4:24 AM  Result Value Ref Range   Prothrombin Time 21.5 (H) 11.4 - 15.2 seconds   INR 1.8 (H) 0.8 - 1.2  Heparin level (unfractionated)   Collection Time: 12/04/23  6:56 AM  Result Value Ref Range   Heparin Unfractionated 0.43 0.30 - 0.70 IU/mL  Basic metabolic panel   Collection Time: 12/05/23  6:16 AM  Result Value Ref Range   Sodium 141 135 - 145 mmol/L   Potassium 3.7 3.5 - 5.1 mmol/L   Chloride 109 98 - 111 mmol/L   CO2 18 (L) 22 - 32 mmol/L   Glucose, Bld 74 70 - 99 mg/dL   BUN 25 (H) 8 - 23 mg/dL   Creatinine, Ser 1.61 0.61 - 1.24 mg/dL   Calcium 8.0 (L) 8.9 - 10.3 mg/dL   GFR, Estimated >09 >60 mL/min   Anion gap 14 5 - 15  CBC with Differential/Platelet   Collection Time: 12/05/23  6:16 AM  Result Value Ref Range   WBC 7.4 4.0 - 10.5 K/uL   RBC 4.12 (L) 4.22 - 5.81 MIL/uL   Hemoglobin 11.3 (L) 13.0 - 17.0 g/dL   HCT 45.4 (L) 09.8 - 11.9 %   MCV 85.9 80.0 - 100.0 fL   MCH 27.4 26.0 - 34.0 pg   MCHC 31.9 30.0 - 36.0 g/dL   RDW 14.7 (H) 82.9 - 56.2 %   Platelets 212 150 - 400 K/uL   nRBC 0.0 0.0 - 0.2 %   Neutrophils Relative % 65 %   Neutro Abs 4.8 1.7 - 7.7 K/uL   Lymphocytes Relative 24 %   Lymphs Abs 1.8 0.7 - 4.0 K/uL   Monocytes Relative 7 %    Monocytes Absolute 0.5 0.1 - 1.0 K/uL   Eosinophils Relative 3 %   Eosinophils Absolute 0.2 0.0 - 0.5 K/uL   Basophils Relative 0 %   Basophils Absolute 0.0 0.0 - 0.1 K/uL   WBC Morphology MORPHOLOGY UNREMARKABLE    RBC Morphology MORPHOLOGY UNREMARKABLE    Smear Review Normal platelet morphology    Immature Granulocytes 1 %   Abs Immature Granulocytes 0.08 (H) 0.00 - 0.07 K/uL  Magnesium   Collection Time: 12/05/23  6:16 AM  Result Value Ref Range   Magnesium 2.2 1.7 - 2.4 mg/dL  Heparin level (unfractionated)   Collection Time: 12/05/23  6:16 AM  Result Value Ref Range   Heparin Unfractionated 0.46 0.30 - 0.70 IU/mL  Glucose, capillary   Collection Time: 12/05/23  5:28 PM  Result Value Ref Range   Glucose-Capillary 66 (L) 70 - 99 mg/dL  Glucose, capillary   Collection Time: 12/05/23 11:28 PM  Result Value Ref Range   Glucose-Capillary 153 (H) 70 - 99 mg/dL  Glucose, capillary   Collection Time: 12/06/23 12:14 AM  Result Value Ref Range   Glucose-Capillary 151 (H) 70 - 99 mg/dL  CBC with Differential/Platelet   Collection Time: 12/06/23  5:09 AM  Result Value Ref Range   WBC 7.2 4.0 - 10.5 K/uL  RBC 3.93 (L) 4.22 - 5.81 MIL/uL   Hemoglobin 10.5 (L) 13.0 - 17.0 g/dL   HCT 16.1 (L) 09.6 - 04.5 %   MCV 84.5 80.0 - 100.0 fL   MCH 26.7 26.0 - 34.0 pg   MCHC 31.6 30.0 - 36.0 g/dL   RDW 40.9 (H) 81.1 - 91.4 %   Platelets 201 150 - 400 K/uL   nRBC 0.0 0.0 - 0.2 %   Neutrophils Relative % 65 %   Neutro Abs 4.7 1.7 - 7.7 K/uL   Lymphocytes Relative 22 %   Lymphs Abs 1.6 0.7 - 4.0 K/uL   Monocytes Relative 7 %   Monocytes Absolute 0.5 0.1 - 1.0 K/uL   Eosinophils Relative 4 %   Eosinophils Absolute 0.3 0.0 - 0.5 K/uL   Basophils Relative 0 %   Basophils Absolute 0.0 0.0 - 0.1 K/uL   WBC Morphology MORPHOLOGY UNREMARKABLE    RBC Morphology MORPHOLOGY UNREMARKABLE    Smear Review Normal platelet morphology    Immature Granulocytes 2 %   Abs Immature Granulocytes 0.16 (H)  0.00 - 0.07 K/uL  Magnesium   Collection Time: 12/06/23  5:09 AM  Result Value Ref Range   Magnesium 2.0 1.7 - 2.4 mg/dL  Heparin level (unfractionated)   Collection Time: 12/06/23  5:09 AM  Result Value Ref Range   Heparin Unfractionated 0.33 0.30 - 0.70 IU/mL  Comprehensive metabolic panel   Collection Time: 12/06/23  5:09 AM  Result Value Ref Range   Sodium 143 135 - 145 mmol/L   Potassium 3.0 (L) 3.5 - 5.1 mmol/L   Chloride 111 98 - 111 mmol/L   CO2 23 22 - 32 mmol/L   Glucose, Bld 166 (H) 70 - 99 mg/dL   BUN 23 8 - 23 mg/dL   Creatinine, Ser 7.82 0.61 - 1.24 mg/dL   Calcium 7.8 (L) 8.9 - 10.3 mg/dL   Total Protein 5.9 (L) 6.5 - 8.1 g/dL   Albumin 2.5 (L) 3.5 - 5.0 g/dL   AST 19 15 - 41 U/L   ALT 17 0 - 44 U/L   Alkaline Phosphatase 41 38 - 126 U/L   Total Bilirubin 0.5 0.0 - 1.2 mg/dL   GFR, Estimated >95 >62 mL/min   Anion gap 9 5 - 15  Phosphorus   Collection Time: 12/06/23  5:09 AM  Result Value Ref Range   Phosphorus 2.3 (L) 2.5 - 4.6 mg/dL  Triglycerides   Collection Time: 12/06/23  5:09 AM  Result Value Ref Range   Triglycerides 89 <150 mg/dL  Glucose, capillary   Collection Time: 12/06/23  5:16 AM  Result Value Ref Range   Glucose-Capillary 147 (H) 70 - 99 mg/dL  Glucose, capillary   Collection Time: 12/06/23 11:32 AM  Result Value Ref Range   Glucose-Capillary 134 (H) 70 - 99 mg/dL  Glucose, capillary   Collection Time: 12/06/23  5:09 PM  Result Value Ref Range   Glucose-Capillary 134 (H) 70 - 99 mg/dL  Glucose, capillary   Collection Time: 12/06/23 11:15 PM  Result Value Ref Range   Glucose-Capillary 142 (H) 70 - 99 mg/dL  Glucose, capillary   Collection Time: 12/07/23  5:36 AM  Result Value Ref Range   Glucose-Capillary 142 (H) 70 - 99 mg/dL  Heparin level (unfractionated)   Collection Time: 12/07/23  5:40 AM  Result Value Ref Range   Heparin Unfractionated 0.24 (L) 0.30 - 0.70 IU/mL  Magnesium   Collection Time: 12/07/23  5:40 AM  Result  Value Ref Range   Magnesium 2.0 1.7 - 2.4 mg/dL  Phosphorus   Collection Time: 12/07/23  5:40 AM  Result Value Ref Range   Phosphorus 3.3 2.5 - 4.6 mg/dL  Basic metabolic panel   Collection Time: 12/07/23  5:40 AM  Result Value Ref Range   Sodium 142 135 - 145 mmol/L   Potassium 3.2 (L) 3.5 - 5.1 mmol/L   Chloride 111 98 - 111 mmol/L   CO2 22 22 - 32 mmol/L   Glucose, Bld 143 (H) 70 - 99 mg/dL   BUN 22 8 - 23 mg/dL   Creatinine, Ser 8.29 0.61 - 1.24 mg/dL   Calcium 8.1 (L) 8.9 - 10.3 mg/dL   GFR, Estimated >56 >21 mL/min   Anion gap 9 5 - 15  CBC with Differential/Platelet   Collection Time: 12/07/23  5:40 AM  Result Value Ref Range   WBC 12.0 (H) 4.0 - 10.5 K/uL   RBC 4.01 (L) 4.22 - 5.81 MIL/uL   Hemoglobin 10.9 (L) 13.0 - 17.0 g/dL   HCT 30.8 (L) 65.7 - 84.6 %   MCV 86.5 80.0 - 100.0 fL   MCH 27.2 26.0 - 34.0 pg   MCHC 31.4 30.0 - 36.0 g/dL   RDW 96.2 (H) 95.2 - 84.1 %   Platelets 200 150 - 400 K/uL   nRBC 0.0 0.0 - 0.2 %   Neutrophils Relative % 77 %   Neutro Abs 9.2 (H) 1.7 - 7.7 K/uL   Lymphocytes Relative 15 %   Lymphs Abs 1.9 0.7 - 4.0 K/uL   Monocytes Relative 5 %   Monocytes Absolute 0.6 0.1 - 1.0 K/uL   Eosinophils Relative 2 %   Eosinophils Absolute 0.3 0.0 - 0.5 K/uL   Basophils Relative 0 %   Basophils Absolute 0.0 0.0 - 0.1 K/uL   WBC Morphology MORPHOLOGY UNREMARKABLE    RBC Morphology MORPHOLOGY UNREMARKABLE    Smear Review Normal platelet morphology    Immature Granulocytes 1 %   Abs Immature Granulocytes 0.11 (H) 0.00 - 0.07 K/uL  Glucose, capillary   Collection Time: 12/07/23 11:58 AM  Result Value Ref Range   Glucose-Capillary 113 (H) 70 - 99 mg/dL  Heparin level (unfractionated)   Collection Time: 12/07/23  3:07 PM  Result Value Ref Range   Heparin Unfractionated 0.18 (L) 0.30 - 0.70 IU/mL  Glucose, capillary   Collection Time: 12/07/23  4:37 PM  Result Value Ref Range   Glucose-Capillary 121 (H) 70 - 99 mg/dL  Glucose, capillary    Collection Time: 12/07/23 11:39 PM  Result Value Ref Range   Glucose-Capillary 122 (H) 70 - 99 mg/dL  Heparin level (unfractionated)   Collection Time: 12/08/23  1:13 AM  Result Value Ref Range   Heparin Unfractionated 0.39 0.30 - 0.70 IU/mL  CBC with Differential/Platelet   Collection Time: 12/08/23  1:13 AM  Result Value Ref Range   WBC 14.5 (H) 4.0 - 10.5 K/uL   RBC 4.05 (L) 4.22 - 5.81 MIL/uL   Hemoglobin 11.0 (L) 13.0 - 17.0 g/dL   HCT 32.4 (L) 40.1 - 02.7 %   MCV 84.4 80.0 - 100.0 fL   MCH 27.2 26.0 - 34.0 pg   MCHC 32.2 30.0 - 36.0 g/dL   RDW 25.3 (H) 66.4 - 40.3 %   Platelets 183 150 - 400 K/uL   nRBC 0.0 0.0 - 0.2 %   Neutrophils Relative % 81 %   Neutro Abs 11.7 (H) 1.7 - 7.7 K/uL  Lymphocytes Relative 13 %   Lymphs Abs 1.8 0.7 - 4.0 K/uL   Monocytes Relative 3 %   Monocytes Absolute 0.4 0.1 - 1.0 K/uL   Eosinophils Relative 2 %   Eosinophils Absolute 0.4 0.0 - 0.5 K/uL   Basophils Relative 0 %   Basophils Absolute 0.0 0.0 - 0.1 K/uL   WBC Morphology TOXIC GRANULATION    Smear Review Normal platelet morphology    Immature Granulocytes 1 %   Abs Immature Granulocytes 0.19 (H) 0.00 - 0.07 K/uL   Polychromasia PRESENT   Magnesium   Collection Time: 12/08/23  1:13 AM  Result Value Ref Range   Magnesium 2.0 1.7 - 2.4 mg/dL  Phosphorus   Collection Time: 12/08/23  1:13 AM  Result Value Ref Range   Phosphorus 2.9 2.5 - 4.6 mg/dL  Basic metabolic panel   Collection Time: 12/08/23  1:13 AM  Result Value Ref Range   Sodium 137 135 - 145 mmol/L   Potassium 3.7 3.5 - 5.1 mmol/L   Chloride 109 98 - 111 mmol/L   CO2 20 (L) 22 - 32 mmol/L   Glucose, Bld 116 (H) 70 - 99 mg/dL   BUN 22 8 - 23 mg/dL   Creatinine, Ser 1.61 0.61 - 1.24 mg/dL   Calcium 8.0 (L) 8.9 - 10.3 mg/dL   GFR, Estimated >09 >60 mL/min   Anion gap 8 5 - 15  Glucose, capillary   Collection Time: 12/08/23  5:38 AM  Result Value Ref Range   Glucose-Capillary 107 (H) 70 - 99 mg/dL  Glucose,  capillary   Collection Time: 12/08/23  8:25 AM  Result Value Ref Range   Glucose-Capillary 121 (H) 70 - 99 mg/dL   Comment 1 Notify RN    Comment 2 Document in Chart   Heparin level (unfractionated)   Collection Time: 12/08/23 10:07 AM  Result Value Ref Range   Heparin Unfractionated 0.23 (L) 0.30 - 0.70 IU/mL  Glucose, capillary   Collection Time: 12/08/23 11:35 AM  Result Value Ref Range   Glucose-Capillary 102 (H) 70 - 99 mg/dL   Comment 1 Notify RN    Comment 2 Document in Chart   Glucose, capillary   Collection Time: 12/08/23  4:40 PM  Result Value Ref Range   Glucose-Capillary 105 (H) 70 - 99 mg/dL   Comment 1 Notify RN   Heparin level (unfractionated)   Collection Time: 12/08/23  8:28 PM  Result Value Ref Range   Heparin Unfractionated 0.50 0.30 - 0.70 IU/mL  Glucose, capillary   Collection Time: 12/08/23 10:40 PM  Result Value Ref Range   Glucose-Capillary 118 (H) 70 - 99 mg/dL  Comprehensive metabolic panel   Collection Time: 12/09/23  5:25 AM  Result Value Ref Range   Sodium 134 (L) 135 - 145 mmol/L   Potassium 3.7 3.5 - 5.1 mmol/L   Chloride 109 98 - 111 mmol/L   CO2 18 (L) 22 - 32 mmol/L   Glucose, Bld 117 (H) 70 - 99 mg/dL   BUN 23 8 - 23 mg/dL   Creatinine, Ser 4.54 0.61 - 1.24 mg/dL   Calcium 7.7 (L) 8.9 - 10.3 mg/dL   Total Protein 5.5 (L) 6.5 - 8.1 g/dL   Albumin 2.2 (L) 3.5 - 5.0 g/dL   AST 30 15 - 41 U/L   ALT 22 0 - 44 U/L   Alkaline Phosphatase 41 38 - 126 U/L   Total Bilirubin 0.3 0.0 - 1.2 mg/dL   GFR, Estimated >09 >  60 mL/min   Anion gap 7 5 - 15  Magnesium   Collection Time: 12/09/23  5:25 AM  Result Value Ref Range   Magnesium 2.0 1.7 - 2.4 mg/dL  Phosphorus   Collection Time: 12/09/23  5:25 AM  Result Value Ref Range   Phosphorus 3.2 2.5 - 4.6 mg/dL  CBC with Differential/Platelet   Collection Time: 12/09/23  5:25 AM  Result Value Ref Range   WBC 10.8 (H) 4.0 - 10.5 K/uL   RBC 3.61 (L) 4.22 - 5.81 MIL/uL   Hemoglobin 10.1 (L)  13.0 - 17.0 g/dL   HCT 16.1 (L) 09.6 - 04.5 %   MCV 86.4 80.0 - 100.0 fL   MCH 28.0 26.0 - 34.0 pg   MCHC 32.4 30.0 - 36.0 g/dL   RDW 40.9 (H) 81.1 - 91.4 %   Platelets 149 (L) 150 - 400 K/uL   nRBC 0.0 0.0 - 0.2 %   Neutrophils Relative % 82 %   Neutro Abs 8.8 (H) 1.7 - 7.7 K/uL   Lymphocytes Relative 11 %   Lymphs Abs 1.2 0.7 - 4.0 K/uL   Monocytes Relative 4 %   Monocytes Absolute 0.5 0.1 - 1.0 K/uL   Eosinophils Relative 1 %   Eosinophils Absolute 0.1 0.0 - 0.5 K/uL   Basophils Relative 0 %   Basophils Absolute 0.0 0.0 - 0.1 K/uL   WBC Morphology Mild Left Shift (1-5% metas, occ myelo)    RBC Morphology MIXED RBC POPULATION    Smear Review Normal platelet morphology    Immature Granulocytes 2 %   Abs Immature Granulocytes 0.20 (H) 0.00 - 0.07 K/uL  Heparin level (unfractionated)   Collection Time: 12/09/23  5:25 AM  Result Value Ref Range   Heparin Unfractionated 0.60 0.30 - 0.70 IU/mL  Protime-INR   Collection Time: 12/09/23  5:25 AM  Result Value Ref Range   Prothrombin Time 16.0 (H) 11.4 - 15.2 seconds   INR 1.3 (H) 0.8 - 1.2  Glucose, capillary   Collection Time: 12/09/23  6:19 AM  Result Value Ref Range   Glucose-Capillary 109 (H) 70 - 99 mg/dL  Glucose, capillary   Collection Time: 12/09/23 11:18 AM  Result Value Ref Range   Glucose-Capillary 102 (H) 70 - 99 mg/dL       Assessment:     [id number]   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 Encounter for current long-term use of anticoagulants  Z79.01 CoaguChek XS/INR Waived (STAT)       Plan:   Discussed current plan face-to-face with patient. For coumadin dosing, elected to continue current dose. Will plan to recheck INR in 2 weeks.

## 2024-01-18 NOTE — Assessment & Plan Note (Signed)
Chronic,  3.2 today which is not within goal.  Goal INR 2.0-3.0.  Continue -5 mg of Coumadin on Monday, Wednesday, Friday, and Sunday & 2.5 mg of Coumadin on Tuesday and Thursday and Saturday.  Follow-up INR in 2 weeks.  Decrease green leafy vegetables.  Will adjust dose if needed at that time.  Bleeding precautions discussed with patient at visit today.   Patient just restarted Coumadin last week after being on Lovenox. Will give 2 more weeks and adjust medication if needed at that time.

## 2024-02-01 ENCOUNTER — Encounter: Payer: Self-pay | Admitting: Nurse Practitioner

## 2024-02-01 ENCOUNTER — Ambulatory Visit: Payer: 59 | Admitting: Nurse Practitioner

## 2024-02-01 VITALS — BP 114/68 | HR 75 | Temp 94.2°F | Ht 72.0 in | Wt 143.2 lb

## 2024-02-01 DIAGNOSIS — D6859 Other primary thrombophilia: Secondary | ICD-10-CM | POA: Diagnosis not present

## 2024-02-01 DIAGNOSIS — J45909 Unspecified asthma, uncomplicated: Secondary | ICD-10-CM | POA: Diagnosis not present

## 2024-02-01 LAB — COAGUCHEK XS/INR WAIVED
INR: 2.2 — ABNORMAL HIGH (ref 0.9–1.1)
Prothrombin Time: 25.8 s

## 2024-02-01 MED ORDER — ALBUTEROL SULFATE HFA 108 (90 BASE) MCG/ACT IN AERS: 2.0000 | INHALATION_SPRAY | Freq: Four times a day (QID) | RESPIRATORY_TRACT | 1 refills | Status: DC | PRN

## 2024-02-01 MED ORDER — WARFARIN SODIUM 5 MG PO TABS: 5.0000 mg | ORAL_TABLET | Freq: Every day | ORAL | 1 refills | Status: DC

## 2024-02-01 NOTE — Assessment & Plan Note (Signed)
 Chronic,  2.2 today which is not within goal.  Goal INR 2.0-3.0.  Continue -5 mg of Coumadin on Monday, Wednesday, Friday, and Sunday & 2.5 mg of Coumadin on Tuesday and Thursday and Saturday.  Follow-up INR in 1 month.  Decrease green leafy vegetables.  Will adjust dose if needed at that time.  Bleeding precautions discussed with patient at visit today. Refill sent today.

## 2024-02-01 NOTE — Progress Notes (Signed)
 BP 114/68 (BP Location: Left Arm, Patient Position: Sitting, Cuff Size: Small)   Pulse 75   Temp (!) 94.2 F (34.6 C) (Oral)   Ht 6' (1.829 m)   Wt 143 lb 3.2 oz (65 kg)   SpO2 92%   BMI 19.42 kg/m    Subjective:    Patient ID: Thomas [name], male    DOB: 1946-06-11, 78 y.o.   MRN: [id number]  CC: Coumadin management  HPI: This patient is a 78 y.o. male who presents for coumadin management. The expected duration of coumadin treatment is lifelong The reason for anticoagulation is   Antithrombinn .  Present Coumadin dose: He was taking Lovenox after hospitalization.  He was on Lovenox until about a week ago then restarted coumadin.  Goal: 2.0-3.0 2.0-3.0 PT: 27.2 INR:3.2 Excessive bruising: no Nose bleeding: no Rectal bleeding: no Prolonged menstrual cycles: N/A Eating diet with consistent amounts of foods containing Vitamin K:yes Any recent antibiotic use? no  Patient states he is having congestion and SOB.    Relevant past medical, surgical, family and social history reviewed and updated as indicated. Interim medical history since our last visit reviewed. Allergies and medications reviewed and updated.  ROS: Per HPI unless specifically indicated above     Objective:    BP 114/68 (BP Location: Left Arm, Patient Position: Sitting, Cuff Size: Small)   Pulse 75   Temp (!) 94.2 F (34.6 C) (Oral)   Ht 6' (1.829 m)   Wt 143 lb 3.2 oz (65 kg)   SpO2 92%   BMI 19.42 kg/m   Wt Readings from Last 3 Encounters:  02/01/24 143 lb 3.2 oz (65 kg)  01/18/24 146 lb (66.2 kg)  12/08/23 171 lb 11.8 oz (77.9 kg)    Physical Exam Vitals and nursing note reviewed.  Constitutional:      General: He is not in acute distress.    Appearance: Normal appearance. He is not ill-appearing, toxic-appearing or diaphoretic.  HENT:     Head: Normocephalic.     Right Ear: External ear normal.     Left Ear: External ear normal.     Nose: Congestion and rhinorrhea present.      Mouth/Throat:     Mouth: Mucous membranes are moist.  Eyes:     General:        Right eye: No discharge.        Left eye: No discharge.     Extraocular Movements: Extraocular movements intact.     Conjunctiva/sclera: Conjunctivae normal.     Pupils: Pupils are equal, round, and reactive to light.  Cardiovascular:     Rate and Rhythm: Normal rate and regular rhythm.     Heart sounds: No murmur heard. Pulmonary:     Effort: Accessory muscle usage and retractions present.     Breath sounds: Normal breath sounds. Decreased air movement present. No wheezing, rhonchi or rales.  Abdominal:     General: Abdomen is flat. Bowel sounds are normal.  Musculoskeletal:     Cervical back: Normal range of motion and neck supple.  Skin:    General: Skin is warm and dry.     Capillary Refill: Capillary refill takes less than 2 seconds.  Neurological:     General: No focal deficit present.     Mental Status: He is alert and oriented to person, place, and time.  Psychiatric:        Mood and Affect: Mood normal.  Behavior: Behavior normal.        Thought Content: Thought content normal.        Judgment: Judgment normal.     General: Well appearing, well nourished in no distress.  Normal mood and affect. 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 Moderate asthma, unspecified whether complicated, unspecified whether persistent  J45.909        Plan:   Discussed current plan  face-to-face with patient. For coumadin dosing, elected to continue current dose. Will plan to recheck INR in 2 weeks.

## 2024-02-01 NOTE — Assessment & Plan Note (Signed)
 Patient reports he has had asthma all his life.  Patient is congested, retracting in the abdomen and visibly SOB in office.  Patient declines chest xray, ER visit, prednisone and antibiotics.  Patient does agree to use an albuterol inhaler.  Patient declines returning to the clinic in 2 days for reevaluation.  Discussed with patient risks and that I am concerned with his current presentation.  He is aware and okay with the risks.

## 2024-03-09 ENCOUNTER — Encounter: Payer: Self-pay | Admitting: Nurse Practitioner

## 2024-03-09 ENCOUNTER — Ambulatory Visit (INDEPENDENT_AMBULATORY_CARE_PROVIDER_SITE_OTHER): Admitting: Nurse Practitioner

## 2024-03-09 VITALS — BP 132/62 | HR 73 | Temp 97.9°F | Resp 16 | Ht 72.01 in | Wt 148.2 lb

## 2024-03-09 DIAGNOSIS — D6859 Other primary thrombophilia: Secondary | ICD-10-CM

## 2024-03-09 LAB — COAGUCHEK XS/INR WAIVED
INR: 1.8 — ABNORMAL HIGH (ref 0.9–1.1)
Prothrombin Time: 21.5 s

## 2024-03-09 NOTE — Progress Notes (Signed)
 BP 132/62 (BP Location: Right Arm, Patient Position: Sitting, Cuff Size: Normal)   Pulse 73   Temp 97.9 F (36.6 C) (Oral)   Resp 16   Ht 6' 0.01" (1.829 m)   Wt 148 lb 3.2 oz (67.2 kg)   SpO2 97%   BMI 20.10 kg/m    Subjective:    Patient ID: Thomas [name], male    DOB: [birth year], 78 y.o.   MRN: [id number]  CC: Coumadin management  HPI: This patient is a 78 y.o. male who presents for coumadin management. The expected duration of coumadin treatment is lifelong The reason for anticoagulation is   Antithrombinn .  Present Coumadin dose: He was taking Lovenox after hospitalization.  He was on Lovenox until about a week ago then restarted coumadin.  Goal: 2.0-3.0 2.0-3.0 PT: 27.2 INR: 1.8 Excessive bruising: no Nose bleeding: no Rectal bleeding: no Prolonged menstrual cycles: N/A Eating diet with consistent amounts of foods containing Vitamin K:yes Any recent antibiotic use? no  Patient states he is having congestion and SOB.    Relevant past medical, surgical, family and social history reviewed and updated as indicated. Interim medical history since our last visit reviewed. Allergies and medications reviewed and updated.  ROS: Per HPI unless specifically indicated above     Objective:    BP 132/62 (BP Location: Right Arm, Patient Position: Sitting, Cuff Size: Normal)   Pulse 73   Temp 97.9 F (36.6 C) (Oral)   Resp 16   Ht 6' 0.01" (1.829 m)   Wt 148 lb 3.2 oz (67.2 kg)   SpO2 97%   BMI 20.10 kg/m   Wt Readings from Last 3 Encounters:  03/09/24 148 lb 3.2 oz (67.2 kg)  02/01/24 143 lb 3.2 oz (65 kg)  01/18/24 146 lb (66.2 kg)    Physical Exam Vitals and nursing note reviewed.  Constitutional:      General: He is not in acute distress.    Appearance: Normal appearance. He is not ill-appearing, toxic-appearing or diaphoretic.  HENT:     Head: Normocephalic.     Right Ear: External ear normal.     Left Ear: External ear normal.     Nose: Rhinorrhea  present.     Mouth/Throat:     Mouth: Mucous membranes are moist.  Eyes:     General:        Right eye: No discharge.        Left eye: No discharge.     Extraocular Movements: Extraocular movements intact.     Conjunctiva/sclera: Conjunctivae normal.     Pupils: Pupils are equal, round, and reactive to light.  Cardiovascular:     Rate and Rhythm: Normal rate and regular rhythm.     Heart sounds: No murmur heard. Pulmonary:     Effort: Accessory muscle usage and retractions present.     Breath sounds: Normal breath sounds. Decreased air movement present. No wheezing, rhonchi or rales.  Abdominal:     General: Abdomen is flat. Bowel sounds are normal.  Musculoskeletal:     Cervical back: Normal range of motion and neck supple.  Skin:    General: Skin is warm and dry.     Capillary Refill: Capillary refill takes less than 2 seconds.  Neurological:     General: No focal deficit present.     Mental Status: He is alert and oriented to person, place, and time.  Psychiatric:        Mood and Affect: Mood normal.  Behavior: Behavior normal.        Thought Content: Thought content normal.        Judgment: Judgment normal.     General: Well appearing, well nourished in no distress.  Normal mood and affect. 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 For coumadin dosing, elected to continue current dose. Will plan  to recheck INR in 2 weeks.

## 2024-03-09 NOTE — Assessment & Plan Note (Signed)
 Chronic,  1.8 today which is not within goal.  Goal INR 2.0-3.0.  Continue -5 mg of Coumadin on Monday, Wednesday, Friday, and Sunday & 2.5 mg of Coumadin on Tuesday and Thursday and Saturday.  Follow-up INR in 2 weeks.  Will adjust dose if needed at that time.  Bleeding precautions discussed with patient at visit today. Refill sent today.

## 2024-03-28 ENCOUNTER — Ambulatory Visit (INDEPENDENT_AMBULATORY_CARE_PROVIDER_SITE_OTHER): Admitting: Nurse Practitioner

## 2024-03-28 ENCOUNTER — Encounter: Payer: Self-pay | Admitting: Nurse Practitioner

## 2024-03-28 VITALS — BP 118/62 | HR 68 | Temp 98.4°F | Resp 15 | Ht 72.01 in | Wt 151.0 lb

## 2024-03-28 DIAGNOSIS — D6859 Other primary thrombophilia: Secondary | ICD-10-CM | POA: Diagnosis not present

## 2024-03-28 LAB — COAGUCHEK XS/INR WAIVED
INR: 1.7 — ABNORMAL HIGH (ref 0.9–1.1)
Prothrombin Time: 20.2 s

## 2024-03-28 MED ORDER — AMLODIPINE BESYLATE 2.5 MG PO TABS: 2.5000 mg | ORAL_TABLET | Freq: Every day | ORAL | 1 refills | Status: DC

## 2024-03-28 NOTE — Assessment & Plan Note (Signed)
 Chronic,  1.8 today which is not within goal.  Goal INR 2.0-3.0.  Continue -5 mg of Coumadin  on Monday, Wednesday, Friday, and Saturday, Sunday & 2.5 mg of Coumadin  on Tuesday and Thursday.  Follow-up INR in 1 weeks.  Will adjust dose if needed at that time.  Bleeding precautions discussed with patient at visit today.

## 2024-03-28 NOTE — Progress Notes (Signed)
 BP 118/62 (BP Location: Right Arm, Patient Position: Sitting, Cuff Size: Normal)   Pulse 68   Temp 98.4 F (36.9 C) (Oral)   Resp 15   Ht 6' 0.01" (1.829 m)   Wt 151 lb (68.5 kg)   SpO2 98%   BMI 20.47 kg/m    Subjective:    Patient ID: Thomas [name], male    DOB: [birth year], 78 y.o.   MRN: [id number]  CC: Coumadin  management  HPI: This patient is a 78 y.o. male who presents for coumadin  management. The expected duration of coumadin  treatment is lifelong The reason for anticoagulation is   Antithrombinn .  Present Coumadin  dose: He was taking Lovenox  after hospitalization.  He was on Lovenox  until about a week ago then restarted coumadin .  Goal: 2.0-3.0 2.0-3.0 PT: 27.2 INR: 1.8 Excessive bruising: no Nose bleeding: no Rectal bleeding: no Prolonged menstrual cycles: N/A Eating diet with consistent amounts of foods containing Vitamin K:yes Any recent antibiotic use? no  Patient states he is having congestion and SOB.    Relevant past medical, surgical, family and social history reviewed and updated as indicated. Interim medical history since our last visit reviewed. Allergies and medications reviewed and updated.  ROS: Per HPI unless specifically indicated above     Objective:    BP 118/62 (BP Location: Right Arm, Patient Position: Sitting, Cuff Size: Normal)   Pulse 68   Temp 98.4 F (36.9 C) (Oral)   Resp 15   Ht 6' 0.01" (1.829 m)   Wt 151 lb (68.5 kg)   SpO2 98%   BMI 20.47 kg/m   Wt Readings from Last 3 Encounters:  03/28/24 151 lb (68.5 kg)  03/09/24 148 lb 3.2 oz (67.2 kg)  02/01/24 143 lb 3.2 oz (65 kg)    Physical Exam Vitals and nursing note reviewed.  Constitutional:      General: He is not in acute distress.    Appearance: Normal appearance. He is not ill-appearing, toxic-appearing or diaphoretic.  HENT:     Head: Normocephalic.     Right Ear: External ear normal.     Left Ear: External ear normal.     Nose: Rhinorrhea present.      Mouth/Throat:     Mouth: Mucous membranes are moist.  Eyes:     General:        Right eye: No discharge.        Left eye: No discharge.     Extraocular Movements: Extraocular movements intact.     Conjunctiva/sclera: Conjunctivae normal.     Pupils: Pupils are equal, round, and reactive to light.  Cardiovascular:     Rate and Rhythm: Normal rate and regular rhythm.     Heart sounds: No murmur heard. Pulmonary:     Effort: Accessory muscle usage and retractions present.     Breath sounds: Normal breath sounds. Decreased air movement present. No wheezing, rhonchi or rales.  Abdominal:     General: Abdomen is flat. Bowel sounds are normal.  Musculoskeletal:     Cervical back: Normal range of motion and neck supple.  Skin:    General: Skin is warm and dry.     Capillary Refill: Capillary refill takes less than 2 seconds.  Neurological:     General: No focal deficit present.     Mental Status: He is alert and oriented to person, place, and time.  Psychiatric:        Mood and Affect: Mood normal.  Behavior: Behavior normal.        Thought Content: Thought content normal.        Judgment: Judgment normal.     General: Well appearing, well nourished in no distress.  Normal mood and affect. 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 For coumadin  dosing, elected to continue current dose. Will plan to recheck  INR in 2 weeks.

## 2024-04-04 ENCOUNTER — Encounter: Payer: Self-pay | Admitting: Nurse Practitioner

## 2024-04-04 ENCOUNTER — Ambulatory Visit (INDEPENDENT_AMBULATORY_CARE_PROVIDER_SITE_OTHER): Admitting: Nurse Practitioner

## 2024-04-04 VITALS — BP 129/72 | HR 58 | Ht 72.0 in | Wt 151.0 lb

## 2024-04-04 DIAGNOSIS — D6859 Other primary thrombophilia: Secondary | ICD-10-CM

## 2024-04-04 LAB — COAGUCHEK XS/INR WAIVED
INR: 1.8 — ABNORMAL HIGH (ref 0.9–1.1)
Prothrombin Time: 22.2 s

## 2024-04-04 NOTE — Assessment & Plan Note (Signed)
 Chronic,  1.8 today which is not within goal.  Goal INR 2.0-3.0.  Continue -5 mg daily.  Follow-up INR in 1 weeks.  Will adjust dose if needed at that time.  Bleeding precautions discussed with patient at visit today.

## 2024-04-04 NOTE — Progress Notes (Signed)
 BP 129/72 (BP Location: Left Arm, Patient Position: Sitting, Cuff Size: Normal)   Pulse (!) 58   Ht 6' (1.829 m)   Wt 151 lb (68.5 kg)   SpO2 97%   BMI 20.48 kg/m    Subjective:    Patient ID: Thomas [name], male    DOB: 1946-05-29, 78 y.o.   MRN: [id number]  CC: Coumadin  management  HPI: This patient is a 78 y.o. male who presents for coumadin  management. The expected duration of coumadin  treatment is lifelong The reason for anticoagulation is   Antithrombinn .  Present Coumadin  dose: He was taking Lovenox  after hospitalization.  He was on Lovenox  until about a week ago then restarted coumadin .  Goal: 2.0-3.0 2.0-3.0 PT: 27.2 INR: 1.8 Excessive bruising: no Nose bleeding: no Rectal bleeding: no Prolonged menstrual cycles: N/A Eating diet with consistent amounts of foods containing Vitamin K:yes Any recent antibiotic use? no  Patient states he is having congestion and SOB.    Relevant past medical, surgical, family and social history reviewed and updated as indicated. Interim medical history since our last visit reviewed. Allergies and medications reviewed and updated.  ROS: Per HPI unless specifically indicated above     Objective:    BP 129/72 (BP Location: Left Arm, Patient Position: Sitting, Cuff Size: Normal)   Pulse (!) 58   Ht 6' (1.829 m)   Wt 151 lb (68.5 kg)   SpO2 97%   BMI 20.48 kg/m   Wt Readings from Last 3 Encounters:  04/04/24 151 lb (68.5 kg)  03/28/24 151 lb (68.5 kg)  03/09/24 148 lb 3.2 oz (67.2 kg)    Physical Exam Vitals and nursing note reviewed.  Constitutional:      General: He is not in acute distress.    Appearance: Normal appearance. He is not ill-appearing, toxic-appearing or diaphoretic.  HENT:     Head: Normocephalic.     Right Ear: External ear normal.     Left Ear: External ear normal.     Nose: Rhinorrhea present.     Mouth/Throat:     Mouth: Mucous membranes are moist.  Eyes:     General:        Right eye: No  discharge.        Left eye: No discharge.     Extraocular Movements: Extraocular movements intact.     Conjunctiva/sclera: Conjunctivae normal.     Pupils: Pupils are equal, round, and reactive to light.  Cardiovascular:     Rate and Rhythm: Normal rate and regular rhythm.     Heart sounds: No murmur heard. Pulmonary:     Effort: Accessory muscle usage and retractions present.     Breath sounds: Normal breath sounds. Decreased air movement present. No wheezing, rhonchi or rales.  Abdominal:     General: Abdomen is flat. Bowel sounds are normal.  Musculoskeletal:     Cervical back: Normal range of motion and neck supple.  Skin:    General: Skin is warm and dry.     Capillary Refill: Capillary refill takes less than 2 seconds.  Neurological:     General: No focal deficit present.     Mental Status: He is alert and oriented to person, place, and time.  Psychiatric:        Mood and Affect: Mood normal.        Behavior: Behavior normal.        Thought Content: Thought content normal.        Judgment:  Judgment normal.     General: Well appearing, well nourished in no distress.  Normal mood and affect. 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 For coumadin  dosing, elected to continue current dose. Will plan to recheck INR in 2 weeks.

## 2024-04-11 ENCOUNTER — Ambulatory Visit (INDEPENDENT_AMBULATORY_CARE_PROVIDER_SITE_OTHER): Admitting: Nurse Practitioner

## 2024-04-11 ENCOUNTER — Encounter: Payer: Self-pay | Admitting: Nurse Practitioner

## 2024-04-11 VITALS — BP 109/64 | HR 67 | Temp 98.2°F | Resp 16 | Ht 72.01 in | Wt 150.6 lb

## 2024-04-11 DIAGNOSIS — D6859 Other primary thrombophilia: Secondary | ICD-10-CM | POA: Diagnosis not present

## 2024-04-11 DIAGNOSIS — R7301 Impaired fasting glucose: Secondary | ICD-10-CM

## 2024-04-11 DIAGNOSIS — D649 Anemia, unspecified: Secondary | ICD-10-CM | POA: Diagnosis not present

## 2024-04-11 DIAGNOSIS — E785 Hyperlipidemia, unspecified: Secondary | ICD-10-CM | POA: Diagnosis not present

## 2024-04-11 DIAGNOSIS — I10 Essential (primary) hypertension: Secondary | ICD-10-CM

## 2024-04-11 LAB — COAGUCHEK XS/INR WAIVED
INR: 2.4 — ABNORMAL HIGH (ref 0.9–1.1)
Prothrombin Time: 29 s

## 2024-04-11 MED ORDER — ATORVASTATIN CALCIUM 10 MG PO TABS
10.0000 mg | ORAL_TABLET | Freq: Every day | ORAL | 1 refills | Status: DC
Start: 2024-04-11 — End: 2024-08-16

## 2024-04-11 NOTE — Assessment & Plan Note (Signed)
 Chronic.  Controlled.  Continue with current medication regimen of Amlodipine  2.5mg .  Does not need refills today.  Labs ordered today.  Return to clinic in 3 months for reevaluation.  Call sooner if concerns arise.

## 2024-04-11 NOTE — Assessment & Plan Note (Signed)
 Chronic, 2.4 today which is not within goal.  Goal INR 2.0-3.0.  Continue -5 mg daily.  Follow-up INR in 1 month.  Will adjust dose if needed at that time.  Bleeding precautions discussed with patient at visit today.

## 2024-04-11 NOTE — Assessment & Plan Note (Signed)
 Labs ordered at visit today.  Will make recommendations based on lab results.

## 2024-04-11 NOTE — Assessment & Plan Note (Signed)
 Chronic.  Controlled.  Continue with current medication regimen of atorvastatin  daily.  Refills sent today.  Labs ordered today.  Return to clinic in 6 months for reevaluation.  Call sooner if concerns arise.

## 2024-04-11 NOTE — Assessment & Plan Note (Signed)
 Ongoing. Declines seeing Hematology.  Patient states he is taking the iron daily.   CBC ordered. Will make recommendations based on lab results.

## 2024-04-11 NOTE — Progress Notes (Signed)
 BP 109/64 (BP Location: Left Arm, Patient Position: Sitting, Cuff Size: Normal)   Pulse 67   Temp 98.2 F (36.8 C) (Oral)   Resp 16   Ht 6' 0.01" (1.829 m)   Wt 150 lb 9.6 oz (68.3 kg)   SpO2 99%   BMI 20.42 kg/m    Subjective:    Patient ID: Thomas [name], male    DOB: 1946-04-10, 78 y.o.   MRN: [id number]  HPI: Thomas [name] is a 78 y.o. male  Chief Complaint  Patient presents with   Coagulation Disorder    Blood check   COUMADIN  CHECK INR Today: 2.8- hasn't changed his diet.   Present Coumadin  dose: 2.5 mg of Coumadin  on Monday, Wednesday, Friday & Sunday 5 mg of Coumadin  on Tuesday, Thursday, Saturday Goal: 2.0-3.0 Excessive bruising: no Nose bleeding: no Rectal bleeding: no Prolonged menstrual cycles: N/A Eating diet with consistent amounts of foods containing Vitamin K:yes Any recent antibiotic use? no Relevant past medical, surgical, family and social history reviewed and updated as indicated. Interim medical history since our last visit reviewed. Allergies and medications reviewed and updated.  HYPERTENSION Hypertension status: controlled  Satisfied with current treatment? no Duration of hypertension: years BP monitoring frequency:  not checking BP range:  BP medication side effects:  no Medication compliance: excellent compliance Previous BP meds:amlodipine  Aspirin: Coumadin  Recurrent headaches: no Visual changes: no Palpitations: no Dyspnea: no Chest pain: no Lower extremity edema: no Dizzy/lightheaded: no  Relevant past medical, surgical, family and social history reviewed and updated as indicated. Interim medical history since our last visit reviewed. Allergies and medications reviewed and updated.  Review of Systems  Eyes:  Negative for visual disturbance.  Respiratory:  Negative for shortness of breath.   Cardiovascular:  Negative for chest pain and leg swelling.  Neurological:  Negative for light-headedness and headaches.    Per  HPI unless specifically indicated above     Objective:     BP 109/64 (BP Location: Left Arm, Patient Position: Sitting, Cuff Size: Normal)   Pulse 67   Temp 98.2 F (36.8 C) (Oral)   Resp 16   Ht 6' 0.01" (1.829 m)   Wt 150 lb 9.6 oz (68.3 kg)   SpO2 99%   BMI 20.42 kg/m   Wt Readings from Last 3 Encounters:  04/11/24 150 lb 9.6 oz (68.3 kg)  04/04/24 151 lb (68.5 kg)  03/28/24 151 lb (68.5 kg)    Physical Exam Vitals and nursing note reviewed.  Constitutional:      General: He is not in acute distress.    Appearance: Normal appearance. He is not ill-appearing, toxic-appearing or diaphoretic.  HENT:     Head: Normocephalic.     Right Ear: External ear normal.     Left Ear: External ear normal.     Nose: Nose normal. No congestion or rhinorrhea.     Mouth/Throat:     Mouth: Mucous membranes are moist.  Eyes:     General:        Right eye: No discharge.        Left eye: No discharge.     Extraocular Movements: Extraocular movements intact.     Conjunctiva/sclera: Conjunctivae normal.     Pupils: Pupils are equal, round, and reactive to light.  Cardiovascular:     Rate and Rhythm: Normal rate and regular rhythm.     Heart sounds: No murmur heard. Pulmonary:     Effort: Pulmonary effort is normal. No respiratory  distress.     Breath sounds: Normal breath sounds. No wheezing, rhonchi or rales.  Abdominal:     General: Abdomen is flat. Bowel sounds are normal.  Musculoskeletal:     Cervical back: Normal range of motion and neck supple.  Skin:    General: Skin is warm and dry.     Capillary Refill: Capillary refill takes less than 2 seconds.  Neurological:     General: No focal deficit present.     Mental Status: He is alert and oriented to person, place, and time.  Psychiatric:        Mood and Affect: Mood normal.        Behavior: Behavior normal.        Thought Content: Thought content normal.        Judgment: Judgment normal.     Results for orders placed  or performed in visit on 04/04/24  CoaguChek XS/INR Waived (STAT)   Collection Time: 04/04/24  1:18 PM  Result Value Ref Range   INR 1.8 (H) 0.9 - 1.1   Prothrombin Time 22.2 sec      Assessment & Plan:   Problem List Items Addressed This Visit       Cardiovascular and Mediastinum   Essential hypertension   Chronic.  Controlled.  Continue with current medication regimen of Amlodipine  2.5mg .  Does not need refills today.  Labs ordered today.  Return to clinic in 3 months for reevaluation.  Call sooner if concerns arise.       Relevant Medications   atorvastatin  (LIPITOR) 10 MG tablet   Other Relevant Orders   Comp Met (CMET)     Endocrine   Impaired fasting glucose   Labs ordered at visit today.  Will make recommendations based on lab results.        Relevant Orders   HgB A1c     Hematopoietic and Hemostatic   Antithrombin 3 deficiency (HCC) - Primary   Chronic, 2.4 today which is not within goal.  Goal INR 2.0-3.0.  Continue -5 mg daily.  Follow-up INR in 1 month.  Will adjust dose if needed at that time.  Bleeding precautions discussed with patient at visit today.       Relevant Orders   CoaguChek XS/INR Waived (STAT)     Other   Hyperlipidemia   Chronic.  Controlled.  Continue with current medication regimen of atorvastatin  daily.  Refills sent today.  Labs ordered today.  Return to clinic in 6 months for reevaluation.  Call sooner if concerns arise.        Relevant Medications   atorvastatin  (LIPITOR) 10 MG tablet   Other Relevant Orders   Lipid Profile   Anemia   Ongoing. Declines seeing Hematology.  Patient states he is taking the iron daily.   CBC ordered. Will make recommendations based on lab results.      Relevant Orders   CBC w/Diff     Follow up plan: Return in about 1 month (around 05/12/2024) for Coag check.

## 2024-04-12 ENCOUNTER — Ambulatory Visit: Payer: Self-pay | Admitting: Nurse Practitioner

## 2024-04-12 LAB — CBC WITH DIFFERENTIAL/PLATELET
Basophils Absolute: 0.1 10*3/uL (ref 0.0–0.2)
Basos: 1 %
EOS (ABSOLUTE): 0.2 10*3/uL (ref 0.0–0.4)
Eos: 3 %
Hematocrit: 42.5 % (ref 37.5–51.0)
Hemoglobin: 13.6 g/dL (ref 13.0–17.7)
Immature Grans (Abs): 0 10*3/uL (ref 0.0–0.1)
Immature Granulocytes: 0 %
Lymphocytes Absolute: 2 10*3/uL (ref 0.7–3.1)
Lymphs: 24 %
MCH: 27.8 pg (ref 26.6–33.0)
MCHC: 32 g/dL (ref 31.5–35.7)
MCV: 87 fL (ref 79–97)
Monocytes Absolute: 0.5 10*3/uL (ref 0.1–0.9)
Monocytes: 6 %
Neutrophils Absolute: 5.4 10*3/uL (ref 1.4–7.0)
Neutrophils: 66 %
Platelets: 141 10*3/uL — ABNORMAL LOW (ref 150–450)
RBC: 4.89 x10E6/uL (ref 4.14–5.80)
RDW: 14.5 % (ref 11.6–15.4)
WBC: 8.2 10*3/uL (ref 3.4–10.8)

## 2024-04-12 LAB — LIPID PANEL
Chol/HDL Ratio: 2.6 ratio (ref 0.0–5.0)
Cholesterol, Total: 102 mg/dL (ref 100–199)
HDL: 40 mg/dL (ref >39)
LDL Chol Calc (NIH): 46 mg/dL (ref 0–99)
Triglycerides: 77 mg/dL (ref 0–149)
VLDL Cholesterol Cal: 16 mg/dL (ref 5–40)

## 2024-04-12 LAB — COMPREHENSIVE METABOLIC PANEL WITH GFR
ALT: 6 IU/L (ref 0–44)
AST: 16 IU/L (ref 0–40)
Albumin: 4.2 g/dL (ref 3.8–4.8)
Alkaline Phosphatase: 70 IU/L (ref 44–121)
BUN/Creatinine Ratio: 12 (ref 10–24)
BUN: 11 mg/dL (ref 8–27)
Bilirubin Total: 0.3 mg/dL (ref 0.0–1.2)
CO2: 24 mmol/L (ref 20–29)
Calcium: 9.1 mg/dL (ref 8.6–10.2)
Chloride: 100 mmol/L (ref 96–106)
Creatinine, Ser: 0.93 mg/dL (ref 0.76–1.27)
Globulin, Total: 2.6 g/dL (ref 1.5–4.5)
Glucose: 83 mg/dL (ref 70–99)
Potassium: 3.9 mmol/L (ref 3.5–5.2)
Sodium: 139 mmol/L (ref 134–144)
Total Protein: 6.8 g/dL (ref 6.0–8.5)
eGFR: 85 mL/min/{1.73_m2} (ref >59)

## 2024-04-12 LAB — HEMOGLOBIN A1C
Est. average glucose Bld gHb Est-mCnc: 114 mg/dL
Hgb A1c MFr Bld: 5.6 % (ref 4.8–5.6)

## 2024-05-04 ENCOUNTER — Other Ambulatory Visit: Payer: Self-pay | Admitting: Nurse Practitioner

## 2024-05-04 NOTE — Telephone Encounter (Signed)
 Copied from CRM [id number]. Topic: Clinical - Medication Refill >> May 04, 2024  2:10 PM Carlatta H wrote: Medication: warfarin (COUMADIN ) 5 MG tablet  Has the patient contacted their pharmacy? No (Agent: If no, request that the patient contact the pharmacy for the refill. If patient does not wish to contact the pharmacy document the reason why and proceed with request.) (Agent: If yes, when and what did the pharmacy advise?)  This is the patient's preferred pharmacy:  [id number] - [place], [place] - [address] [place] [address] Phone: [phone] Fax: [phone]  Is this the correct pharmacy for this prescription? Yes If no, delete pharmacy and type the correct one.   Has the prescription been filled recently? No  Is the patient out of the medication? No  Has the patient been seen for an appointment in the last year OR does the patient have an upcoming appointment? Yes  Can we respond through MyChart? No  Agent: Please be advised that Rx refills may take up to 3 business days. We ask that you follow-up with your pharmacy.

## 2024-05-05 NOTE — Telephone Encounter (Signed)
 Requested medication (s) are due for refill today: yes  Requested medication (s) are on the active medication list: yes  Last refill:  02/22/24  Future visit scheduled: no  Notes to clinic:  Manual Review: If patient's warfarin is managed by Anti-Coag team, route request to them. If not, route request to the provider.      Requested Prescriptions  Pending Prescriptions Disp Refills   warfarin (COUMADIN ) 5 MG tablet 90 tablet 1    Sig: Take 1 tablet (5 mg total) by mouth daily.     Hematology:  Anticoagulants - warfarin Failed - 05/05/2024  1:37 PM      Failed - Manual Review: If patient's warfarin is managed by Anti-Coag team, route request to them. If not, route request to the provider.      Failed - INR in normal range and within 30 days    INR  Date Value Ref Range Status  04/11/2024 2.4 (H) 0.9 - 1.1 Final         Passed - HCT in normal range and within 360 days    Hematocrit  Date Value Ref Range Status  04/11/2024 42.5 37.5 - 51.0 % Final         Passed - Patient is not pregnant      Passed - Valid encounter within last 3 months    Recent Outpatient Visits           3 weeks ago Antithrombin 3 deficiency (HCC)   [place] [facility] Aileen Alexanders, NP   1 month ago Antithrombin 3 deficiency [facility])   [place] [facility] Aileen Alexanders, NP   1 month ago Antithrombin 3 deficiency [facility])   [place] [facility] Aileen Alexanders, NP   1 month ago Antithrombin 3 deficiency [facility])   [place] [facility] Aileen Alexanders, NP   3 months ago Antithrombin 3 deficiency [facility])   [place] [facility] Aileen Alexanders, NP

## 2024-05-08 MED ORDER — WARFARIN SODIUM 5 MG PO TABS: 5.0000 mg | ORAL_TABLET | Freq: Every day | ORAL | 1 refills | Status: DC

## 2024-05-18 ENCOUNTER — Ambulatory Visit: Payer: Self-pay | Admitting: Nurse Practitioner

## 2024-05-18 ENCOUNTER — Encounter: Payer: Self-pay | Admitting: Nurse Practitioner

## 2024-05-18 ENCOUNTER — Ambulatory Visit (INDEPENDENT_AMBULATORY_CARE_PROVIDER_SITE_OTHER): Admitting: Nurse Practitioner

## 2024-05-18 VITALS — BP 115/68 | HR 68 | Ht 72.0 in | Wt 153.0 lb

## 2024-05-18 DIAGNOSIS — D6859 Other primary thrombophilia: Secondary | ICD-10-CM | POA: Diagnosis not present

## 2024-05-18 LAB — COAGUCHEK XS/INR WAIVED
INR: 2.4 — ABNORMAL HIGH (ref 0.9–1.1)
Prothrombin Time: 28.4 s

## 2024-05-18 NOTE — Assessment & Plan Note (Signed)
 Chronic, 2.4 today which is within goal.  Goal INR 2.0-3.0.  Continue -5 mg daily.  Follow-up INR in 1 month.  Will adjust dose if needed at that time.  Bleeding precautions discussed with patient at visit today.

## 2024-05-18 NOTE — Progress Notes (Signed)
 BP 115/68   Pulse 68   Ht 6' (1.829 m)   Wt 153 lb (69.4 kg)   BMI 20.75 kg/m    Subjective:    Patient ID: Thomas [name], male    DOB: 26-Oct-1946, 78 y.o.   MRN: [id number]  HPI: Thomas [name] is a 78 y.o. male  Chief Complaint  Patient presents with   Medical Management of Chronic Issues   COUMADIN  CHECK INR Today: 2.4- hasn't changed his diet.   Present Coumadin  dose: 2.5 mg of Coumadin  on Monday, Wednesday, Friday & Sunday 5 mg of Coumadin  on Tuesday, Thursday, Saturday Goal: 2.0-3.0 Excessive bruising: no Nose bleeding: no Rectal bleeding: no Prolonged menstrual cycles: N/A Eating diet with consistent amounts of foods containing Vitamin K:yes Any recent antibiotic use? no   Relevant past medical, surgical, family and social history reviewed and updated as indicated. Interim medical history since our last visit reviewed. Allergies and medications reviewed and updated.  Review of Systems  Eyes:  Negative for visual disturbance.  Respiratory:  Negative for shortness of breath.   Cardiovascular:  Negative for chest pain and leg swelling.  Neurological:  Negative for light-headedness and headaches.    Per HPI unless specifically indicated above     Objective:     BP 115/68   Pulse 68   Ht 6' (1.829 m)   Wt 153 lb (69.4 kg)   BMI 20.75 kg/m   Wt Readings from Last 3 Encounters:  05/18/24 153 lb (69.4 kg)  04/11/24 150 lb 9.6 oz (68.3 kg)  04/04/24 151 lb (68.5 kg)    Physical Exam Vitals and nursing note reviewed.  Constitutional:      General: He is not in acute distress.    Appearance: Normal appearance. He is not ill-appearing, toxic-appearing or diaphoretic.  HENT:     Head: Normocephalic.     Right Ear: External ear normal.     Left Ear: External ear normal.     Nose: Nose normal. No congestion or rhinorrhea.     Mouth/Throat:     Mouth: Mucous membranes are moist.   Eyes:     General:        Right eye: No discharge.        Left  eye: No discharge.     Extraocular Movements: Extraocular movements intact.     Conjunctiva/sclera: Conjunctivae normal.     Pupils: Pupils are equal, round, and reactive to light.    Cardiovascular:     Rate and Rhythm: Normal rate and regular rhythm.     Heart sounds: No murmur heard. Pulmonary:     Effort: Pulmonary effort is normal. No respiratory distress.     Breath sounds: Normal breath sounds. No wheezing, rhonchi or rales.  Abdominal:     General: Abdomen is flat. Bowel sounds are normal.   Musculoskeletal:     Cervical back: Normal range of motion and neck supple.   Skin:    General: Skin is warm and dry.     Capillary Refill: Capillary refill takes less than 2 seconds.   Neurological:     General: No focal deficit present.     Mental Status: He is alert and oriented to person, place, and time.   Psychiatric:        Mood and Affect: Mood normal.        Behavior: Behavior normal.        Thought Content: Thought content normal.  Judgment: Judgment normal.     Results for orders placed or performed in visit on 04/11/24  Comp Met (CMET)   Collection Time: 04/11/24  2:06 PM  Result Value Ref Range   Glucose 83 70 - 99 mg/dL   BUN 11 8 - 27 mg/dL   Creatinine, Ser 1.47 0.76 - 1.27 mg/dL   eGFR 85 >82 NF/AOZ/3.08   BUN/Creatinine Ratio 12 10 - 24   Sodium 139 134 - 144 mmol/L   Potassium 3.9 3.5 - 5.2 mmol/L   Chloride 100 96 - 106 mmol/L   CO2 24 20 - 29 mmol/L   Calcium  9.1 8.6 - 10.2 mg/dL   Total Protein 6.8 6.0 - 8.5 g/dL   Albumin 4.2 3.8 - 4.8 g/dL   Globulin, Total 2.6 1.5 - 4.5 g/dL   Bilirubin Total 0.3 0.0 - 1.2 mg/dL   Alkaline Phosphatase 70 44 - 121 IU/L   AST 16 0 - 40 IU/L   ALT 6 0 - 44 IU/L  Lipid Profile   Collection Time: 04/11/24  2:06 PM  Result Value Ref Range   Cholesterol, Total 102 100 - 199 mg/dL   Triglycerides 77 0 - 149 mg/dL   HDL 40 >65 mg/dL   VLDL Cholesterol Cal 16 5 - 40 mg/dL   LDL Chol Calc (NIH) 46 0 - 99  mg/dL   Chol/HDL Ratio 2.6 0.0 - 5.0 ratio  CBC w/Diff   Collection Time: 04/11/24  2:06 PM  Result Value Ref Range   WBC 8.2 3.4 - 10.8 x10E3/uL   RBC 4.89 4.14 - 5.80 x10E6/uL   Hemoglobin 13.6 13.0 - 17.7 g/dL   Hematocrit 78.4 69.6 - 51.0 %   MCV 87 79 - 97 fL   MCH 27.8 26.6 - 33.0 pg   MCHC 32.0 31.5 - 35.7 g/dL   RDW 29.5 28.4 - 13.2 %   Platelets 141 (L) 150 - 450 x10E3/uL   Neutrophils 66 Not Estab. %   Lymphs 24 Not Estab. %   Monocytes 6 Not Estab. %   Eos 3 Not Estab. %   Basos 1 Not Estab. %   Neutrophils Absolute 5.4 1.4 - 7.0 x10E3/uL   Lymphocytes Absolute 2.0 0.7 - 3.1 x10E3/uL   Monocytes Absolute 0.5 0.1 - 0.9 x10E3/uL   EOS (ABSOLUTE) 0.2 0.0 - 0.4 x10E3/uL   Basophils Absolute 0.1 0.0 - 0.2 x10E3/uL   Immature Granulocytes 0 Not Estab. %   Immature Grans (Abs) 0.0 0.0 - 0.1 x10E3/uL  CoaguChek XS/INR Waived (STAT)   Collection Time: 04/11/24  2:06 PM  Result Value Ref Range   INR 2.4 (H) 0.9 - 1.1   Prothrombin Time 29.0 sec  HgB A1c   Collection Time: 04/11/24  2:06 PM  Result Value Ref Range   Hgb A1c MFr Bld 5.6 4.8 - 5.6 %   Est. average glucose Bld gHb Est-mCnc 114 mg/dL      Assessment & Plan:   Problem List Items Addressed This Visit       Hematopoietic and Hemostatic   Antithrombin 3 deficiency (HCC) - Primary   Chronic, 2.4 today which is within goal.  Goal INR 2.0-3.0.  Continue -5 mg daily.  Follow-up INR in 1 month.  Will adjust dose if needed at that time.  Bleeding precautions discussed with patient at visit today.       Relevant Orders   CoaguChek XS/INR Waived (STAT)      Follow up plan: Return in about 1  month (around 06/17/2024) for Coag check.

## 2024-06-19 ENCOUNTER — Ambulatory Visit (INDEPENDENT_AMBULATORY_CARE_PROVIDER_SITE_OTHER): Admitting: Nurse Practitioner

## 2024-06-19 ENCOUNTER — Encounter: Payer: Self-pay | Admitting: Nurse Practitioner

## 2024-06-19 VITALS — BP 135/67 | HR 56 | Wt 158.4 lb

## 2024-06-19 DIAGNOSIS — Z7901 Long term (current) use of anticoagulants: Secondary | ICD-10-CM

## 2024-06-19 DIAGNOSIS — D6859 Other primary thrombophilia: Secondary | ICD-10-CM | POA: Diagnosis not present

## 2024-06-19 LAB — COAGUCHEK XS/INR WAIVED
INR: 2.3 — ABNORMAL HIGH (ref 0.9–1.1)
Prothrombin Time: 27.5 s

## 2024-06-19 NOTE — Assessment & Plan Note (Signed)
 Chronic, 2.3 today which is within goal.  Goal INR 2.0-3.0.  Continue -5 mg daily.  Follow-up INR in 1 month.  Will adjust dose if needed at that time.  Bleeding precautions discussed with patient at visit today.

## 2024-06-19 NOTE — Assessment & Plan Note (Signed)
 Chronic,  3.0 today which is within goal.  Goal INR 2.0-3.0.  Continue -5 mg of Coumadin on Monday, Wednesday, Friday, and Sunday & 2.5 mg of Coumadin on Tuesday and Thursday and Saturday.  Follow-up INR in 1 month.  Decrease green leafy vegetables.  Will adjust dose if needed at that time.  Bleeding precautions discussed with patient at visit today.

## 2024-06-19 NOTE — Progress Notes (Signed)
 BP 135/67   Pulse (!) 56   Wt 158 lb 6.4 oz (71.8 kg)   SpO2 97%   BMI 21.48 kg/m    Subjective:    Patient ID: Thomas [name], male    DOB: 04/24/1946, 78 y.o.   MRN: [id number]  HPI: Thomas [name] is a 78 y.o. male  Chief Complaint  Patient presents with   Follow-up    Some soreness from cutting grass    COUMADIN  CHECK INR Today: 2.3- hasn't changed his diet.   Present Coumadin  dose: 2.5 mg of Coumadin  on Monday, Wednesday, Friday & Sunday 5 mg of Coumadin  on Tuesday, Thursday, Saturday Goal: 2.0-3.0 Excessive bruising: no Nose bleeding: no Rectal bleeding: no Prolonged menstrual cycles: N/A Eating diet with consistent amounts of foods containing Vitamin K :yes Any recent antibiotic use? no   Relevant past medical, surgical, family and social history reviewed and updated as indicated. Interim medical history since our last visit reviewed. Allergies and medications reviewed and updated.  Review of Systems  Eyes:  Negative for visual disturbance.  Respiratory:  Negative for shortness of breath.   Cardiovascular:  Negative for chest pain and leg swelling.  Neurological:  Negative for light-headedness and headaches.    Per HPI unless specifically indicated above     Objective:     BP 135/67   Pulse (!) 56   Wt 158 lb 6.4 oz (71.8 kg)   SpO2 97%   BMI 21.48 kg/m   Wt Readings from Last 3 Encounters:  06/19/24 158 lb 6.4 oz (71.8 kg)  05/18/24 153 lb (69.4 kg)  04/11/24 150 lb 9.6 oz (68.3 kg)    Physical Exam Vitals and nursing note reviewed.  Constitutional:      General: He is not in acute distress.    Appearance: Normal appearance. He is not ill-appearing, toxic-appearing or diaphoretic.  HENT:     Head: Normocephalic.     Right Ear: External ear normal.     Left Ear: External ear normal.     Nose: Nose normal. No congestion or rhinorrhea.     Mouth/Throat:     Mouth: Mucous membranes are moist.  Eyes:     General:        Right eye: No  discharge.        Left eye: No discharge.     Extraocular Movements: Extraocular movements intact.     Conjunctiva/sclera: Conjunctivae normal.     Pupils: Pupils are equal, round, and reactive to light.  Cardiovascular:     Rate and Rhythm: Normal rate and regular rhythm.     Heart sounds: No murmur heard. Pulmonary:     Effort: Pulmonary effort is normal. No respiratory distress.     Breath sounds: Normal breath sounds. No wheezing, rhonchi or rales.  Abdominal:     General: Abdomen is flat. Bowel sounds are normal.  Musculoskeletal:     Cervical back: Normal range of motion and neck supple.  Skin:    General: Skin is warm and dry.     Capillary Refill: Capillary refill takes less than 2 seconds.  Neurological:     General: No focal deficit present.     Mental Status: He is alert and oriented to person, place, and time.  Psychiatric:        Mood and Affect: Mood normal.        Behavior: Behavior normal.        Thought Content: Thought content normal.  Judgment: Judgment normal.     Results for orders placed or performed in visit on 05/18/24  CoaguChek XS/INR Waived (STAT)   Collection Time: 05/18/24  1:16 PM  Result Value Ref Range   INR 2.4 (H) 0.9 - 1.1   Prothrombin Time 28.4 sec      Assessment & Plan:   Problem List Items Addressed This Visit       Hematopoietic and Hemostatic   Antithrombin 3 deficiency (HCC) - Primary   Chronic, 2.3 today which is within goal.  Goal INR 2.0-3.0.  Continue -5 mg daily.  Follow-up INR in 1 month.  Will adjust dose if needed at that time.  Bleeding precautions discussed with patient at visit today.       Relevant Orders   CoaguChek XS/INR Waived (STAT)     Other   Encounter for current long-term use of anticoagulants   Chronic,  3.0 today which is within goal.  Goal INR 2.0-3.0.  Continue -5 mg of Coumadin  on Monday, Wednesday, Friday, and Sunday & 2.5 mg of Coumadin  on Tuesday and Thursday and Saturday.  Follow-up INR  in 1 month.  Decrease green leafy vegetables.  Will adjust dose if needed at that time.  Bleeding precautions discussed with patient at visit today.       Relevant Orders   CoaguChek XS/INR Waived (STAT)       Follow up plan: No follow-ups on file.

## 2024-06-20 ENCOUNTER — Ambulatory Visit: Payer: Self-pay | Admitting: Nurse Practitioner

## 2024-07-20 ENCOUNTER — Ambulatory Visit (INDEPENDENT_AMBULATORY_CARE_PROVIDER_SITE_OTHER): Admitting: Nurse Practitioner

## 2024-07-20 ENCOUNTER — Encounter: Payer: Self-pay | Admitting: Nurse Practitioner

## 2024-07-20 VITALS — BP 136/77 | HR 62 | Temp 98.4°F | Resp 15 | Ht 72.0 in | Wt 159.8 lb

## 2024-07-20 DIAGNOSIS — D649 Anemia, unspecified: Secondary | ICD-10-CM | POA: Diagnosis not present

## 2024-07-20 DIAGNOSIS — D6859 Other primary thrombophilia: Secondary | ICD-10-CM

## 2024-07-20 DIAGNOSIS — Z7901 Long term (current) use of anticoagulants: Secondary | ICD-10-CM | POA: Diagnosis not present

## 2024-07-20 DIAGNOSIS — I10 Essential (primary) hypertension: Secondary | ICD-10-CM | POA: Diagnosis not present

## 2024-07-20 DIAGNOSIS — R7301 Impaired fasting glucose: Secondary | ICD-10-CM

## 2024-07-20 LAB — COAGUCHEK XS/INR WAIVED
INR: 2 — ABNORMAL HIGH (ref 0.9–1.1)
Prothrombin Time: 24.2 s

## 2024-07-20 NOTE — Assessment & Plan Note (Signed)
 Chronic.  Controlled.  Continue with current medication regimen of Amlodipine.  Labs ordered today.  Return to clinic in 3 months for reevaluation.  Call sooner if concerns arise.

## 2024-07-20 NOTE — Assessment & Plan Note (Signed)
Chronic.  Controlled.  Continue with current medication regimen of ferrous sulfate.  Labs ordered today.  Return to clinic in 6 months for reevaluation.  Call sooner if concerns arise.  ? ?

## 2024-07-20 NOTE — Assessment & Plan Note (Signed)
 Chronic.  Controlled.  Last A1c in May was 5.6%.  Continue with diet control at this time. Labs ordered today.  Return to clinic in 6 months for reevaluation.  Call sooner if concerns arise.

## 2024-07-20 NOTE — Assessment & Plan Note (Addendum)
 Chronic,  2.0 today which is within goal.  Goal INR 2.0-3.0.  Continue -5 mg of Coumadin  daily.  Follow-up INR in 1 month.  Decrease green leafy vegetables.  Will adjust dose if needed at that time.  Bleeding precautions discussed with patient at visit today.

## 2024-07-20 NOTE — Progress Notes (Signed)
 BP 136/77 (BP Location: Left Arm, Patient Position: Sitting, Cuff Size: Normal)   Pulse 62   Temp 98.4 F (36.9 C) (Oral)   Resp 15   Ht 6' (1.829 m)   Wt 159 lb 12.8 oz (72.5 kg)   SpO2 97%   BMI 21.67 kg/m    Subjective:    Patient ID: Thomas [name], male    DOB: 22-Mar-1946, 78 y.o.   MRN: [id number]  HPI: Thomas [name] is a 78 y.o. male  Chief Complaint  Patient presents with   Coagulation Disorder   COUMADIN  CHECK INR Today: 2.0- hasn't changed his diet.   Present Coumadin  dose: 5 mg of Coumadin  daily Goal: 2.0-3.0 Excessive bruising: no Nose bleeding: no Rectal bleeding: no Prolonged menstrual cycles: N/A Eating diet with consistent amounts of foods containing Vitamin K :yes Any recent antibiotic use? no Relevant past medical, surgical, family and social history reviewed and updated as indicated. Interim medical history since our last visit reviewed. Allergies and medications reviewed and updated.  HYPERTENSION Denies concerns regarding medications. Hypertension status: controlled  Satisfied with current treatment? no Duration of hypertension: years BP monitoring frequency:  not checking BP range:  BP medication side effects:  no Medication compliance: excellent compliance Previous BP meds:amlodipine  Aspirin: Coumadin  Recurrent headaches: no Visual changes: no Palpitations: no Dyspnea: no Chest pain: no Lower extremity edema: no Dizzy/lightheaded: no  Does not want further treatment for cancer.  Does not want to follow up with GI or oncology.   Relevant past medical, surgical, family and social history reviewed and updated as indicated. Interim medical history since our last visit reviewed. Allergies and medications reviewed and updated.  Review of Systems  Eyes:  Negative for visual disturbance.  Respiratory:  Negative for shortness of breath.   Cardiovascular:  Negative for chest pain and leg swelling.  Neurological:  Negative for  light-headedness and headaches.    Per HPI unless specifically indicated above     Objective:     BP 136/77 (BP Location: Left Arm, Patient Position: Sitting, Cuff Size: Normal)   Pulse 62   Temp 98.4 F (36.9 C) (Oral)   Resp 15   Ht 6' (1.829 m)   Wt 159 lb 12.8 oz (72.5 kg)   SpO2 97%   BMI 21.67 kg/m   Wt Readings from Last 3 Encounters:  07/20/24 159 lb 12.8 oz (72.5 kg)  06/19/24 158 lb 6.4 oz (71.8 kg)  05/18/24 153 lb (69.4 kg)    Physical Exam Vitals and nursing note reviewed.  Constitutional:      General: Thomas [name] is not in acute distress.    Appearance: Normal appearance. Thomas [name] is not ill-appearing, toxic-appearing or diaphoretic.  HENT:     Head: Normocephalic.     Right Ear: External ear normal.     Left Ear: External ear normal.     Nose: Nose normal. No congestion or rhinorrhea.     Mouth/Throat:     Mouth: Mucous membranes are moist.  Eyes:     General:        Right eye: No discharge.        Left eye: No discharge.     Extraocular Movements: Extraocular movements intact.     Conjunctiva/sclera: Conjunctivae normal.     Pupils: Pupils are equal, round, and reactive to light.  Cardiovascular:     Rate and Rhythm: Normal rate and regular rhythm.     Heart sounds: No murmur heard. Pulmonary:     Effort:  Pulmonary effort is normal. No respiratory distress.     Breath sounds: Normal breath sounds. No wheezing, rhonchi or rales.  Abdominal:     General: Abdomen is flat. Bowel sounds are normal.  Musculoskeletal:     Cervical back: Normal range of motion and neck supple.  Skin:    General: Skin is warm and dry.     Capillary Refill: Capillary refill takes less than 2 seconds.  Neurological:     General: No focal deficit present.     Mental Status: Thomas [name] is alert and oriented to person, place, and time.  Psychiatric:        Mood and Affect: Mood normal.        Behavior: Behavior normal.        Thought Content: Thought content normal.        Judgment:  Judgment normal.     Results for orders placed or performed in visit on 06/19/24  CoaguChek XS/INR Waived (STAT)   Collection Time: 06/19/24  1:24 PM  Result Value Ref Range   INR 2.3 (H) 0.9 - 1.1   Prothrombin Time 27.5 sec      Assessment & Plan:   Problem List Items Addressed This Visit       Cardiovascular and Mediastinum   Essential hypertension - Primary   Chronic.  Controlled.  Continue with current medication regimen of Amlodipine .  Labs ordered today.  Return to clinic in 3 months for reevaluation.  Call sooner if concerns arise.        Relevant Orders   Comprehensive metabolic panel with GFR     Endocrine   Impaired fasting glucose   Chronic.  Controlled.  Last A1c in May was 5.6%.  Continue with diet control at this time. Labs ordered today.  Return to clinic in 6 months for reevaluation.  Call sooner if concerns arise.        Relevant Orders   Hemoglobin A1c     Hematopoietic and Hemostatic   Antithrombin 3 deficiency (HCC)   Chronic,  2.0 today which is within goal.  Goal INR 2.0-3.0.  Continue -5 mg of Coumadin  daily.  Follow-up INR in 1 month.  Decrease green leafy vegetables.  Will adjust dose if needed at that time.  Bleeding precautions discussed with patient at visit today.         Other   Encounter for current long-term use of anticoagulants   Chronic,  2.0 today which is within goal.  Goal INR 2.0-3.0.  Continue -5 mg of Coumadin  daily.  Follow-up INR in 1 month.  Decrease green leafy vegetables.  Will adjust dose if needed at that time.  Bleeding precautions discussed with patient at visit today.       Relevant Orders   CoaguChek XS/INR Waived (STAT)   CBC w/Diff   Anemia   Chronic.  Controlled.  Continue with current medication regimen of ferrous sulfate.  Labs ordered today.  Return to clinic in 6 months for reevaluation.  Call sooner if concerns arise.           Follow up plan: Return in about 1 month (around 08/20/2024) for Coag  check.

## 2024-07-21 ENCOUNTER — Ambulatory Visit: Payer: Self-pay | Admitting: Nurse Practitioner

## 2024-07-21 LAB — COMPREHENSIVE METABOLIC PANEL WITH GFR
ALT: 9 IU/L (ref 0–44)
AST: 16 IU/L (ref 0–40)
Albumin: 4.3 g/dL (ref 3.8–4.8)
Alkaline Phosphatase: 79 IU/L (ref 44–121)
BUN/Creatinine Ratio: 13 (ref 10–24)
BUN: 14 mg/dL (ref 8–27)
Bilirubin Total: 0.6 mg/dL (ref 0.0–1.2)
CO2: 22 mmol/L (ref 20–29)
Calcium: 9.2 mg/dL (ref 8.6–10.2)
Chloride: 102 mmol/L (ref 96–106)
Creatinine, Ser: 1.05 mg/dL (ref 0.76–1.27)
Globulin, Total: 2.9 g/dL (ref 1.5–4.5)
Glucose: 92 mg/dL (ref 70–99)
Potassium: 4.3 mmol/L (ref 3.5–5.2)
Sodium: 139 mmol/L (ref 134–144)
Total Protein: 7.2 g/dL (ref 6.0–8.5)
eGFR: 73 mL/min/1.73 (ref >59)

## 2024-07-21 LAB — CBC WITH DIFFERENTIAL/PLATELET
Basophils Absolute: 0.1 x10E3/uL (ref 0.0–0.2)
Basos: 1 %
EOS (ABSOLUTE): 0.2 x10E3/uL (ref 0.0–0.4)
Eos: 2 %
Hematocrit: 44.8 % (ref 37.5–51.0)
Hemoglobin: 14.2 g/dL (ref 13.0–17.7)
Immature Grans (Abs): 0 x10E3/uL (ref 0.0–0.1)
Immature Granulocytes: 0 %
Lymphocytes Absolute: 2 x10E3/uL (ref 0.7–3.1)
Lymphs: 29 %
MCH: 28.4 pg (ref 26.6–33.0)
MCHC: 31.7 g/dL (ref 31.5–35.7)
MCV: 90 fL (ref 79–97)
Monocytes Absolute: 0.4 x10E3/uL (ref 0.1–0.9)
Monocytes: 6 %
Neutrophils Absolute: 4.3 x10E3/uL (ref 1.4–7.0)
Neutrophils: 62 %
Platelets: 155 x10E3/uL (ref 150–450)
RBC: 5 x10E6/uL (ref 4.14–5.80)
RDW: 12.9 % (ref 11.6–15.4)
WBC: 6.9 x10E3/uL (ref 3.4–10.8)

## 2024-07-21 LAB — HEMOGLOBIN A1C
Est. average glucose Bld gHb Est-mCnc: 108 mg/dL
Hgb A1c MFr Bld: 5.4 % (ref 4.8–5.6)

## 2024-08-15 ENCOUNTER — Other Ambulatory Visit: Payer: Self-pay | Admitting: Nurse Practitioner

## 2024-08-16 NOTE — Telephone Encounter (Signed)
 Lipid panel in date  Requested Prescriptions  Pending Prescriptions Disp Refills   atorvastatin  (LIPITOR) 10 MG tablet [Pharmacy Med Name: ATORVASTATIN  10 MG TABLET] 90 tablet 1    Sig: TAKE 1 TABLET BY MOUTH EVERY DAY     Cardiovascular:  Antilipid - Statins Failed - 08/16/2024  2:17 PM      Failed - Lipid Panel in normal range within the last 12 months    Cholesterol, Total  Date Value Ref Range Status  04/11/2024 102 100 - 199 mg/dL Final   LDL Chol Calc (NIH)  Date Value Ref Range Status  04/11/2024 46 0 - 99 mg/dL Final   HDL  Date Value Ref Range Status  04/11/2024 40 >39 mg/dL Final   Triglycerides  Date Value Ref Range Status  04/11/2024 77 0 - 149 mg/dL Final         Passed - Patient is not pregnant      Passed - Valid encounter within last 12 months    Recent Outpatient Visits           3 weeks ago Essential hypertension   [place] [facility] Melvin Pao, NP   1 month ago Antithrombin 3 deficiency [facility])   [place] [facility] Melvin Pao, NP   3 months ago Antithrombin 3 deficiency [facility])   [place] [facility] Melvin Pao, NP   4 months ago Antithrombin 3 deficiency [facility])   [place] [facility] Melvin Pao, NP   4 months ago Antithrombin 3 deficiency [facility])   [place] [facility] Melvin Pao, NP

## 2024-08-23 ENCOUNTER — Encounter: Payer: Self-pay | Admitting: Nurse Practitioner

## 2024-08-23 ENCOUNTER — Ambulatory Visit (INDEPENDENT_AMBULATORY_CARE_PROVIDER_SITE_OTHER): Admitting: Nurse Practitioner

## 2024-08-23 VITALS — BP 129/71 | HR 65 | Temp 98.0°F | Ht 72.0 in | Wt 159.8 lb

## 2024-08-23 DIAGNOSIS — D6859 Other primary thrombophilia: Secondary | ICD-10-CM

## 2024-08-23 DIAGNOSIS — Z7901 Long term (current) use of anticoagulants: Secondary | ICD-10-CM | POA: Diagnosis not present

## 2024-08-23 LAB — COAGUCHEK XS/INR WAIVED
INR: 1.9 — ABNORMAL HIGH (ref 0.9–1.1)
Prothrombin Time: 22.7 s

## 2024-08-23 NOTE — Assessment & Plan Note (Signed)
 Chronic,  1.9 today which is within goal.  Goal INR 2.0-3.0.  Continue -5 mg of Coumadin  daily.  Follow-up INR in 2 weeks.  Decrease green leafy vegetables.  Will adjust dose if needed at that time.  Bleeding precautions discussed with patient at visit today.

## 2024-08-23 NOTE — Progress Notes (Signed)
 BP 129/71   Pulse 65   Temp 98 F (36.7 C) (Oral)   Ht 6' (1.829 m)   Wt 159 lb 12.8 oz (72.5 kg)   SpO2 94%   BMI 21.67 kg/m    Subjective:    Patient ID: Thomas [name], male    DOB: 1946-08-23, 78 y.o.   MRN: [id number]  HPI: Thomas [name] is a 78 y.o. male  Chief Complaint  Patient presents with   coag check   COUMADIN  CHECK INR Today: 1.9-  eating more peas. Present Coumadin  dose: 5mg  daily Goal: 2.0-3.0 Excessive bruising: no Nose bleeding: no Rectal bleeding: no Prolonged menstrual cycles: N/A Eating diet with consistent amounts of foods containing Vitamin K :yes Any recent antibiotic use? no   Relevant past medical, surgical, family and social history reviewed and updated as indicated. Interim medical history since our last visit reviewed. Allergies and medications reviewed and updated.  Review of Systems  Eyes:  Negative for visual disturbance.  Respiratory:  Negative for shortness of breath.   Cardiovascular:  Negative for chest pain and leg swelling.  Neurological:  Negative for light-headedness and headaches.    Per HPI unless specifically indicated above     Objective:     BP 129/71   Pulse 65   Temp 98 F (36.7 C) (Oral)   Ht 6' (1.829 m)   Wt 159 lb 12.8 oz (72.5 kg)   SpO2 94%   BMI 21.67 kg/m   Wt Readings from Last 3 Encounters:  08/23/24 159 lb 12.8 oz (72.5 kg)  07/20/24 159 lb 12.8 oz (72.5 kg)  06/19/24 158 lb 6.4 oz (71.8 kg)    Physical Exam Vitals and nursing note reviewed.  Constitutional:      General: He is not in acute distress.    Appearance: Normal appearance. He is not ill-appearing, toxic-appearing or diaphoretic.  HENT:     Head: Normocephalic.     Right Ear: External ear normal.     Left Ear: External ear normal.     Nose: Nose normal. No congestion or rhinorrhea.     Mouth/Throat:     Mouth: Mucous membranes are moist.  Eyes:     General:        Right eye: No discharge.        Left eye: No  discharge.     Extraocular Movements: Extraocular movements intact.     Conjunctiva/sclera: Conjunctivae normal.     Pupils: Pupils are equal, round, and reactive to light.  Cardiovascular:     Rate and Rhythm: Normal rate and regular rhythm.     Heart sounds: No murmur heard. Pulmonary:     Effort: Pulmonary effort is normal. No respiratory distress.     Breath sounds: Normal breath sounds. No wheezing, rhonchi or rales.  Abdominal:     General: Abdomen is flat. Bowel sounds are normal.  Musculoskeletal:     Cervical back: Normal range of motion and neck supple.  Skin:    General: Skin is warm and dry.     Capillary Refill: Capillary refill takes less than 2 seconds.  Neurological:     General: No focal deficit present.     Mental Status: He is alert and oriented to person, place, and time.  Psychiatric:        Mood and Affect: Mood normal.        Behavior: Behavior normal.        Thought Content: Thought content normal.  Judgment: Judgment normal.     Results for orders placed or performed in visit on 07/20/24  CoaguChek XS/INR Waived (STAT)   Collection Time: 07/20/24  1:53 PM  Result Value Ref Range   INR 2.0 (H) 0.9 - 1.1   Prothrombin Time 24.2 sec  Comprehensive metabolic panel with GFR   Collection Time: 07/20/24  1:54 PM  Result Value Ref Range   Glucose 92 70 - 99 mg/dL   BUN 14 8 - 27 mg/dL   Creatinine, Ser 8.94 0.76 - 1.27 mg/dL   eGFR 73 >40 fO/fpw/8.26   BUN/Creatinine Ratio 13 10 - 24   Sodium 139 134 - 144 mmol/L   Potassium 4.3 3.5 - 5.2 mmol/L   Chloride 102 96 - 106 mmol/L   CO2 22 20 - 29 mmol/L   Calcium  9.2 8.6 - 10.2 mg/dL   Total Protein 7.2 6.0 - 8.5 g/dL   Albumin 4.3 3.8 - 4.8 g/dL   Globulin, Total 2.9 1.5 - 4.5 g/dL   Bilirubin Total 0.6 0.0 - 1.2 mg/dL   Alkaline Phosphatase 79 44 - 121 IU/L   AST 16 0 - 40 IU/L   ALT 9 0 - 44 IU/L  Hemoglobin A1c   Collection Time: 07/20/24  1:54 PM  Result Value Ref Range   Hgb A1c MFr  Bld 5.4 4.8 - 5.6 %   Est. average glucose Bld gHb Est-mCnc 108 mg/dL  CBC w/Diff   Collection Time: 07/20/24  1:54 PM  Result Value Ref Range   WBC 6.9 3.4 - 10.8 x10E3/uL   RBC 5.00 4.14 - 5.80 x10E6/uL   Hemoglobin 14.2 13.0 - 17.7 g/dL   Hematocrit 55.1 62.4 - 51.0 %   MCV 90 79 - 97 fL   MCH 28.4 26.6 - 33.0 pg   MCHC 31.7 31.5 - 35.7 g/dL   RDW 87.0 88.3 - 84.5 %   Platelets 155 150 - 450 x10E3/uL   Neutrophils 62 Not Estab. %   Lymphs 29 Not Estab. %   Monocytes 6 Not Estab. %   Eos 2 Not Estab. %   Basos 1 Not Estab. %   Neutrophils Absolute 4.3 1.4 - 7.0 x10E3/uL   Lymphocytes Absolute 2.0 0.7 - 3.1 x10E3/uL   Monocytes Absolute 0.4 0.1 - 0.9 x10E3/uL   EOS (ABSOLUTE) 0.2 0.0 - 0.4 x10E3/uL   Basophils Absolute 0.1 0.0 - 0.2 x10E3/uL   Immature Granulocytes 0 Not Estab. %   Immature Grans (Abs) 0.0 0.0 - 0.1 x10E3/uL      Assessment & Plan:   Problem List Items Addressed This Visit       Hematopoietic and Hemostatic   Antithrombin 3 deficiency   Chronic,  1.9 today which is within goal.  Goal INR 2.0-3.0.  Continue -5 mg of Coumadin  daily.  Follow-up INR in 2 weeks.  Decrease green leafy vegetables.  Will adjust dose if needed at that time.  Bleeding precautions discussed with patient at visit today.       Relevant Orders   CoaguChek XS/INR Waived (STAT)     Other   Encounter for current long-term use of anticoagulants - Primary   Chronic,  1.9 today which is within goal.  Goal INR 2.0-3.0.  Continue -5 mg of Coumadin  daily.  Follow-up INR in 2 weeks.  Decrease green leafy vegetables.  Will adjust dose if needed at that time.  Bleeding precautions discussed with patient at visit today.       Relevant Orders  CoaguChek XS/INR Waived (STAT)       Follow up plan: Return in about 2 weeks (around 09/06/2024) for Coag check.

## 2024-08-24 ENCOUNTER — Ambulatory Visit: Payer: Self-pay | Admitting: Nurse Practitioner

## 2024-09-06 ENCOUNTER — Ambulatory Visit: Admitting: Nurse Practitioner

## 2024-09-12 ENCOUNTER — Encounter: Payer: Self-pay | Admitting: Nurse Practitioner

## 2024-09-12 ENCOUNTER — Ambulatory Visit (INDEPENDENT_AMBULATORY_CARE_PROVIDER_SITE_OTHER): Admitting: Nurse Practitioner

## 2024-09-12 VITALS — BP 126/69 | HR 67 | Temp 97.6°F | Ht 72.0 in | Wt 160.4 lb

## 2024-09-12 DIAGNOSIS — D6859 Other primary thrombophilia: Secondary | ICD-10-CM | POA: Diagnosis not present

## 2024-09-12 DIAGNOSIS — Z7901 Long term (current) use of anticoagulants: Secondary | ICD-10-CM

## 2024-09-12 LAB — COAGUCHEK XS/INR WAIVED
INR: 2.4 — ABNORMAL HIGH (ref 0.9–1.1)
Prothrombin Time: 29.1 s

## 2024-09-12 NOTE — Progress Notes (Signed)
 BP 126/69   Pulse 67   Temp 97.6 F (36.4 C) (Oral)   Ht 6' (1.829 m)   Wt 160 lb 6.4 oz (72.8 kg)   SpO2 98%   BMI 21.75 kg/m    Subjective:    Patient ID: Thomas [name], male    DOB: 03/27/1946, 78 y.o.   MRN: [id number]  HPI: Thomas [name] is a 78 y.o. male  Chief Complaint  Patient presents with   Antithrombin 3 Deficiency   COUMADIN  CHECK INR Today: 2.4 Present Coumadin  dose: 5mg  daily Goal: 2.0-3.0 Excessive bruising: no Nose bleeding: no Rectal bleeding: no Prolonged menstrual cycles: N/A Eating diet with consistent amounts of foods containing Vitamin K :yes Any recent antibiotic use? no   Relevant past medical, surgical, family and social history reviewed and updated as indicated. Interim medical history since our last visit reviewed. Allergies and medications reviewed and updated.  Review of Systems  Eyes:  Negative for visual disturbance.  Respiratory:  Negative for shortness of breath.   Cardiovascular:  Negative for chest pain and leg swelling.  Neurological:  Negative for light-headedness and headaches.    Per HPI unless specifically indicated above     Objective:     BP 126/69   Pulse 67   Temp 97.6 F (36.4 C) (Oral)   Ht 6' (1.829 m)   Wt 160 lb 6.4 oz (72.8 kg)   SpO2 98%   BMI 21.75 kg/m   Wt Readings from Last 3 Encounters:  09/12/24 160 lb 6.4 oz (72.8 kg)  08/23/24 159 lb 12.8 oz (72.5 kg)  07/20/24 159 lb 12.8 oz (72.5 kg)    Physical Exam Vitals and nursing note reviewed.  Constitutional:      General: He is not in acute distress.    Appearance: Normal appearance. He is not ill-appearing, toxic-appearing or diaphoretic.  HENT:     Head: Normocephalic.     Right Ear: External ear normal.     Left Ear: External ear normal.     Nose: Nose normal. No congestion or rhinorrhea.     Mouth/Throat:     Mouth: Mucous membranes are moist.  Eyes:     General:        Right eye: No discharge.        Left eye: No discharge.      Extraocular Movements: Extraocular movements intact.     Conjunctiva/sclera: Conjunctivae normal.     Pupils: Pupils are equal, round, and reactive to light.  Cardiovascular:     Rate and Rhythm: Normal rate and regular rhythm.     Heart sounds: No murmur heard. Pulmonary:     Effort: Pulmonary effort is normal. No respiratory distress.     Breath sounds: Normal breath sounds. No wheezing, rhonchi or rales.  Abdominal:     General: Abdomen is flat. Bowel sounds are normal.  Musculoskeletal:     Cervical back: Normal range of motion and neck supple.  Skin:    General: Skin is warm and dry.     Capillary Refill: Capillary refill takes less than 2 seconds.  Neurological:     General: No focal deficit present.     Mental Status: He is alert and oriented to person, place, and time.  Psychiatric:        Mood and Affect: Mood normal.        Behavior: Behavior normal.        Thought Content: Thought content normal.  Judgment: Judgment normal.     Results for orders placed or performed in visit on 08/23/24  CoaguChek XS/INR Waived (STAT)   Collection Time: 08/23/24  1:20 PM  Result Value Ref Range   INR 1.9 (H) 0.9 - 1.1   Prothrombin Time 22.7 sec      Assessment & Plan:   Problem List Items Addressed This Visit       Hematopoietic and Hemostatic   Antithrombin 3 deficiency - Primary   Chronic,  2.4 today which is within goal.  Goal INR 2.0-3.0.  Continue -5 mg of Coumadin  daily.  Follow-up INR in 1 month.  Continue with consistent amounts of green leafy vegetables.  Will adjust dose if needed at that time.  Bleeding precautions discussed with patient at visit today.       Relevant Orders   CoaguChek XS/INR Waived (STAT)     Other   Encounter for current long-term use of anticoagulants   Chronic,  2.4 today which is within goal.  Goal INR 2.0-3.0.  Continue -5 mg of Coumadin  daily.  Follow-up INR in 1 month.  Continue with consistent amounts of green leafy  vegetables.  Will adjust dose if needed at that time.  Bleeding precautions discussed with patient at visit today.       Relevant Orders   CoaguChek XS/INR Waived (STAT)       Follow up plan: No follow-ups on file.

## 2024-09-12 NOTE — Assessment & Plan Note (Signed)
 Chronic,  2.4 today which is within goal.  Goal INR 2.0-3.0.  Continue -5 mg of Coumadin  daily.  Follow-up INR in 1 month.  Continue with consistent amounts of green leafy vegetables.  Will adjust dose if needed at that time.  Bleeding precautions discussed with patient at visit today.

## 2024-09-13 ENCOUNTER — Ambulatory Visit: Payer: Self-pay | Admitting: Nurse Practitioner

## 2024-09-28 ENCOUNTER — Other Ambulatory Visit: Payer: Self-pay | Admitting: Nurse Practitioner

## 2024-09-28 NOTE — Telephone Encounter (Unsigned)
 Copied from CRM #[id number]. Topic: Clinical - Medication Refill >> Sep 28, 2024 12:06 PM Gustabo D wrote: Medication: amLODipine  (NORVASC ) 2.5 MG tablet  Has the patient contacted their pharmacy? No (Agent: If no, request that the patient contact the pharmacy for the refill. If patient does not wish to contact the pharmacy document the reason why and proceed with request.) (Agent: If yes, when and what did the pharmacy advise?)  This is the patient's preferred pharmacy:  [id number] - [place], [place] - [address] [place] [address] Phone: [phone] Fax: [phone]  Is this the correct pharmacy for this prescription? Yes If no, delete pharmacy and type the correct one.   Has the prescription been filled recently? No  Is the patient out of the medication? Yes  Has the patient been seen for an appointment in the last year OR does the patient have an upcoming appointment? No  Can we respond through MyChart? No  Agent: Please be advised that Rx refills may take up to 3 business days. We ask that you follow-up with your pharmacy.

## 2024-09-29 MED ORDER — AMLODIPINE BESYLATE 2.5 MG PO TABS: 2.5000 mg | ORAL_TABLET | Freq: Every day | ORAL | 1 refills | Status: DC

## 2024-09-29 NOTE — Telephone Encounter (Signed)
 Requested Prescriptions  Pending Prescriptions Disp Refills   amLODipine  (NORVASC ) 2.5 MG tablet 90 tablet 1    Sig: Take 1 tablet (2.5 mg total) by mouth daily.     Cardiovascular: Calcium  Channel Blockers 2 Passed - 09/29/2024  4:25 PM      Passed - Last BP in normal range    BP Readings from Last 1 Encounters:  09/12/24 126/69         Passed - Last Heart Rate in normal range    Pulse Readings from Last 1 Encounters:  09/12/24 67         Passed - Valid encounter within last 6 months    Recent Outpatient Visits           2 weeks ago Antithrombin 3 deficiency   [place] [facility] Melvin Pao, NP   1 month ago Encounter for current long-term use of anticoagulants   [place] [facility] Melvin Pao, NP   2 months ago Essential hypertension   [place] [facility] Melvin Pao, NP   3 months ago Antithrombin 3 deficiency   [place] [facility] Melvin Pao, NP   4 months ago Antithrombin 3 deficiency   [place] [facility] Melvin Pao, NP

## 2024-10-10 ENCOUNTER — Encounter: Payer: Self-pay | Admitting: Emergency Medicine

## 2024-10-10 ENCOUNTER — Emergency Department
Admission: EM | Admit: 2024-10-10 | Discharge: 2024-10-10 | Disposition: A | Discharge status: Home / Self Care | Attending: Emergency Medicine | Admitting: Emergency Medicine

## 2024-10-10 ENCOUNTER — Other Ambulatory Visit: Payer: Self-pay

## 2024-10-10 ENCOUNTER — Emergency Department

## 2024-10-10 DIAGNOSIS — J208 Acute bronchitis due to other specified organisms: Secondary | ICD-10-CM

## 2024-10-10 DIAGNOSIS — Z7901 Long term (current) use of anticoagulants: Secondary | ICD-10-CM | POA: Insufficient documentation

## 2024-10-10 DIAGNOSIS — R059 Cough, unspecified: Secondary | ICD-10-CM | POA: Insufficient documentation

## 2024-10-10 DIAGNOSIS — R0602 Shortness of breath: Secondary | ICD-10-CM | POA: Diagnosis present

## 2024-10-10 DIAGNOSIS — J45909 Unspecified asthma, uncomplicated: Secondary | ICD-10-CM | POA: Diagnosis not present

## 2024-10-10 LAB — COMPREHENSIVE METABOLIC PANEL WITH GFR
ALT: 8 U/L (ref 0–44)
AST: 22 U/L (ref 15–41)
Albumin: 4.3 g/dL (ref 3.5–5.0)
Alkaline Phosphatase: 68 U/L (ref 38–126)
Anion gap: 11 (ref 5–15)
BUN: 12 mg/dL (ref 8–23)
CO2: 24 mmol/L (ref 22–32)
Calcium: 9.1 mg/dL (ref 8.9–10.3)
Chloride: 102 mmol/L (ref 98–111)
Creatinine, Ser: 0.92 mg/dL (ref 0.61–1.24)
GFR, Estimated: >60 mL/min (ref >60)
Glucose, Bld: 112 mg/dL — ABNORMAL HIGH (ref 70–99)
Potassium: 3.8 mmol/L (ref 3.5–5.1)
Sodium: 137 mmol/L (ref 135–145)
Total Bilirubin: 0.9 mg/dL (ref 0.0–1.2)
Total Protein: 7.4 g/dL (ref 6.5–8.1)

## 2024-10-10 LAB — RESP PANEL BY RT-PCR (RSV, FLU A&B, COVID)  RVPGX2
Influenza A by PCR: NEGATIVE
Influenza B by PCR: NEGATIVE
Resp Syncytial Virus by PCR: NEGATIVE
SARS Coronavirus 2 by RT PCR: NEGATIVE

## 2024-10-10 LAB — CBC WITH DIFFERENTIAL/PLATELET
Abs Immature Granulocytes: 0.04 K/uL (ref 0.00–0.07)
Basophils Absolute: 0.1 K/uL (ref 0.0–0.1)
Basophils Relative: 1 %
Eosinophils Absolute: 0.2 K/uL (ref 0.0–0.5)
Eosinophils Relative: 2 %
HCT: 46.9 % (ref 39.0–52.0)
Hemoglobin: 15.4 g/dL (ref 13.0–17.0)
Immature Granulocytes: 1 %
Lymphocytes Relative: 14 %
Lymphs Abs: 1.2 K/uL (ref 0.7–4.0)
MCH: 29.2 pg (ref 26.0–34.0)
MCHC: 32.8 g/dL (ref 30.0–36.0)
MCV: 89 fL (ref 80.0–100.0)
Monocytes Absolute: 0.4 K/uL (ref 0.1–1.0)
Monocytes Relative: 5 %
Neutro Abs: 6.5 K/uL (ref 1.7–7.7)
Neutrophils Relative %: 77 %
Platelets: 120 K/uL — ABNORMAL LOW (ref 150–400)
RBC: 5.27 MIL/uL (ref 4.22–5.81)
RDW: 12.8 % (ref 11.5–15.5)
WBC: 8.2 K/uL (ref 4.0–10.5)
nRBC: 0 % (ref 0.0–0.2)

## 2024-10-10 LAB — PROTIME-INR
INR: 2.1 — ABNORMAL HIGH (ref 0.8–1.2)
Prothrombin Time: 24.6 s — ABNORMAL HIGH (ref 11.4–15.2)

## 2024-10-10 LAB — APTT: aPTT: 50 s — ABNORMAL HIGH (ref 24–36)

## 2024-10-10 LAB — TROPONIN T, HIGH SENSITIVITY
Troponin T High Sensitivity: 25 ng/L — ABNORMAL HIGH (ref 0–19)
Troponin T High Sensitivity: 23 ng/L — ABNORMAL HIGH (ref 0–19)

## 2024-10-10 MED ORDER — PREDNISONE 20 MG PO TABS: 60.0000 mg | ORAL_TABLET | Freq: Once | ORAL | Status: DC

## 2024-10-10 MED ORDER — PREDNISONE 20 MG PO TABS: 40.0000 mg | ORAL_TABLET | Freq: Every day | ORAL | 0 refills | Status: AC

## 2024-10-10 NOTE — Discharge Instructions (Addendum)
 We are starting you on steroids to help with any inflammation of your lungs. Given you are on warfarin we will hold any antibiotics, do not see any evidence of pneumonia without any fever, white count but if you develop worsening symptoms we please return to the ER for evaluation You have a repeat warfarin level done in 1 week.  Return to the ER with any issues with bleeding, shortness of breath or any other concern

## 2024-10-10 NOTE — ED Triage Notes (Signed)
 Presents via EMS from home  States he developed some SOB with some wheezing couple of days ago Min relief with inhaler Also has had prod cough  Yellowish phlegm

## 2024-10-10 NOTE — ED Provider Notes (Signed)
 [facility] Provider Note    Event Date/Time   First MD Initiated Contact with Patient 10/10/24 [phone]     (approximate)   History   Shortness of Breath   HPI  Thomas [name] is a 78 y.o. male with history of Antithrombin deficiency who comes in with concerns for shortness of breath.  Patient is on Coumadin  secondary to history of blood clots.  She also reports a history of asthma that he takes an inhaler as needed for.  He reports starting to feel short of breath yesterday.  Reports having a productive cough but denies any blood.  He denies any chest pain, abdominal pain, swelling his legs or any other concerns.  Reports been compliant with his warfarin.  Per EMS patient initially had diffuse wheezing but after DuoNeb wheezing had resolved.  Physical Exam   Triage Vital Signs: ED Triage Vitals [10/10/24 0940]  Encounter Vitals Group     BP 134/74     Girls Systolic BP Percentile      Girls Diastolic BP Percentile      Boys Systolic BP Percentile      Boys Diastolic BP Percentile      Pulse Rate 84     Resp 20     Temp (!) 97.5 F (36.4 C)     Temp Source Oral     SpO2 95 %     Weight 160 lb 7.9 oz (72.8 kg)     Height 6' (1.829 m)     Head Circumference      Peak Flow      Pain Score 0     Pain Loc      Pain Education      Exclude from Growth Chart     Most recent vital signs: Vitals:   10/10/24 0940  BP: 134/74  Pulse: 84  Resp: 20  Temp: (!) 97.5 F (36.4 C)  SpO2: 95%     General: Awake, no distress.  CV:  Good peripheral perfusion.  Resp:  Normal effort.  No wheezing noted but intermittently cough Abd:  No distention.  Soft and nontender Other:  No swelling in his legs.  No calf tenderness   ED Results / Procedures / Treatments   Labs (all labs ordered are listed, but only abnormal results are displayed) Labs Reviewed  RESP PANEL BY RT-PCR (RSV, FLU A&B, COVID)  [id number]  CBC WITH DIFFERENTIAL/PLATELET  COMPREHENSIVE  METABOLIC PANEL WITH GFR  PROTIME-INR  APTT  TROPONIN T, HIGH SENSITIVITY     EKG  My interpretation of EKG:  Normal sinus rate of 78 without any ST elevation or T wave inversions, normal intervals  RADIOLOGY I have reviewed the xray personally and interpreted no focal pneumonia   PROCEDURES:  Critical Care performed: No  .1-3 Lead EKG Interpretation  Performed by: Ernest Ronal BRAVO, MD Authorized by: Ernest Ronal BRAVO, MD     Interpretation: normal     ECG rate:  80   ECG rate assessment: normal     Rhythm: sinus rhythm     Ectopy: none     Conduction: normal      MEDICATIONS ORDERED IN ED: Medications  predniSONE (DELTASONE) tablet 60 mg (has no administration in time range)     IMPRESSION / MDM / ASSESSMENT AND PLAN / ED COURSE  I reviewed the triage vital signs and the nursing notes.   Patient's presentation is most consistent with acute presentation with potential threat to life  or bodily function.   Differential includes COVID, pneumonia, asthma, viral illness.  Seems less likely PE given patient has her productive cough with wheezing but will get INR level to ensure therapeutic.  Patient reports feeling much improved at this time.   MPRESSION: 1. Chronic pulmonary hyperinflation appears progressed since 2012. No acute findings.  Troponin went from 25 downtrending to 23.  His CBC is reassuring platelets were slightly low but he has had some similar low counts before.  CMP is reassuring with normal kidney function.  INR is therapeutic at 2.1  Patient is ambulatory stat was normal.  He reports feeling much improved he did have a little bit of wheezing on reassessment but he denies any symptoms I suspect patient has a viral bronchitis versus asthma exacerbation.  We discussed his abnormal troponins but he denies any chest pain I specked this is more likely demand given downtrending in nature I considered admission to the hospital but patient reports resolution of  symptoms and would prefer to go home.  I did discuss with pharmacy given patient's being on warfarin to see if we put him on some steroids when they would recommend him to have his warfarin levels rechecked.  Pharmacy did recommend having a warfarin check in 1 week.  They did recommend me to hold off on the doxycycline unless it was definitely needed as these together could cause increasing risk for elevated warfarin levels.  Patient has no fever no white count and no pneumonia on x-ray so we will hold the doxycycline and just do the prednisone.  Patient denies any known allergy to dexamethasone and was okay with trying the prednisone.  He already has an albuterol  inhaler to use at home.  His amatory pulse ox was reassuring.   The patient is on the cardiac monitor to evaluate for evidence of arrhythmia and/or significant heart rate changes.      FINAL CLINICAL IMPRESSION(S) / ED DIAGNOSES   Final diagnoses:  Mild asthma without complication, unspecified whether persistent  Viral bronchitis     Rx / DC Orders   ED Discharge Orders          Ordered    predniSONE (DELTASONE) 20 MG tablet  Daily with breakfast        10/10/24 1235             Note:  This document was prepared using Dragon voice recognition software and may include unintentional dictation errors.   Ernest Ronal BRAVO, MD 10/10/24 1236

## 2024-10-10 NOTE — ED Notes (Signed)
 This tech stood pt up to walk around the halls in flex. Pt stood up with no assistance and was able to walk with a cane. His stats staring was Pulse 88 and O2 94%. At the end of the walks stats were Pulse 91 and O2 96%.

## 2024-10-11 ENCOUNTER — Telehealth: Payer: Self-pay | Admitting: Nurse Practitioner

## 2024-10-11 NOTE — Telephone Encounter (Signed)
 Patient came into the office and stated that he was seen in the ER for SOB. He was told to ask  if a smaller does of  warfarin (COUMADIN ) 5 MG tablet 5 mg, Daily    Would be appreciate.  Patient would also like for provider to know that he was given Prednisone for the SOB please advise.

## 2024-10-11 NOTE — Telephone Encounter (Signed)
 His INR was therapeutic in the ED yesterday.  It does not appear that he needs a lower dose.  They do recommend he come in next week for an INR check.

## 2024-10-12 NOTE — Telephone Encounter (Signed)
 Called and LVM letting patient know Karen's response. Asked for patient to call back with any questions. Appointment is scheduled for 10/16/24

## 2024-10-16 ENCOUNTER — Encounter: Payer: Self-pay | Admitting: Nurse Practitioner

## 2024-10-16 ENCOUNTER — Ambulatory Visit: Admitting: Nurse Practitioner

## 2024-10-16 VITALS — BP 128/68 | Temp 97.7°F | Ht 72.01 in | Wt 160.8 lb

## 2024-10-16 DIAGNOSIS — J45909 Unspecified asthma, uncomplicated: Secondary | ICD-10-CM | POA: Diagnosis not present

## 2024-10-16 DIAGNOSIS — D6859 Other primary thrombophilia: Secondary | ICD-10-CM

## 2024-10-16 LAB — COAGUCHEK XS/INR WAIVED
INR: 4 — ABNORMAL HIGH (ref 0.9–1.1)
Prothrombin Time: 48.3 s

## 2024-10-16 MED ORDER — BUDESONIDE-FORMOTEROL FUMARATE 160-4.5 MCG/ACT IN AERO: 2.0000 | INHALATION_SPRAY | Freq: Two times a day (BID) | RESPIRATORY_TRACT | 3 refills | Status: AC

## 2024-10-16 NOTE — Assessment & Plan Note (Signed)
 Will start Symbicort.  Discussed how to use medication. Discussed rinsing mouth out after use.

## 2024-10-16 NOTE — Progress Notes (Signed)
 BP 128/68 (BP Location: Right Arm, Patient Position: Sitting, Cuff Size: Normal)   Temp 97.7 F (36.5 C) (Oral)   Ht 6' 0.01 (1.829 m)   Wt 160 lb 12.8 oz (72.9 kg)   SpO2 93%   BMI 21.80 kg/m    Subjective:    Patient ID: Thomas [name], male    DOB: 03/27/1946, 78 y.o.   MRN: [id number]  HPI: Thomas [name] is a 78 y.o. male  Chief Complaint  Patient presents with   Sinus Problem    Patient is having issues with sinuses and drainage. He states he's been spitting a lot and blowing his nose constantly.   COUMADIN  CHECK INR Today: 2.4 Present Coumadin  dose: 5mg  daily Goal: 2.0-3.0 Excessive bruising: no Nose bleeding: no Rectal bleeding: no Prolonged menstrual cycles: N/A Eating diet with consistent amounts of foods containing Vitamin K :yes Any recent antibiotic use? no   Relevant past medical, surgical, family and social history reviewed and updated as indicated. Interim medical history since our last visit reviewed. Allergies and medications reviewed and updated.  Review of Systems  Eyes:  Negative for visual disturbance.  Respiratory:  Positive for cough and shortness of breath.   Cardiovascular:  Negative for chest pain and leg swelling.  Neurological:  Negative for light-headedness and headaches.    Per HPI unless specifically indicated above     Objective:     BP 128/68 (BP Location: Right Arm, Patient Position: Sitting, Cuff Size: Normal)   Temp 97.7 F (36.5 C) (Oral)   Ht 6' 0.01 (1.829 m)   Wt 160 lb 12.8 oz (72.9 kg)   SpO2 93%   BMI 21.80 kg/m   Wt Readings from Last 3 Encounters:  10/16/24 160 lb 12.8 oz (72.9 kg)  10/10/24 160 lb 7.9 oz (72.8 kg)  09/12/24 160 lb 6.4 oz (72.8 kg)    Physical Exam Vitals and nursing note reviewed.  Constitutional:      General: He is not in acute distress.    Appearance: Normal appearance. He is not ill-appearing, toxic-appearing or diaphoretic.  HENT:     Head: Normocephalic.     Right Ear:  External ear normal.     Left Ear: External ear normal.     Nose: Nose normal. No congestion or rhinorrhea.     Mouth/Throat:     Mouth: Mucous membranes are moist.  Eyes:     General:        Right eye: No discharge.        Left eye: No discharge.     Extraocular Movements: Extraocular movements intact.     Conjunctiva/sclera: Conjunctivae normal.     Pupils: Pupils are equal, round, and reactive to light.  Cardiovascular:     Rate and Rhythm: Normal rate and regular rhythm.     Heart sounds: No murmur heard. Pulmonary:     Effort: Pulmonary effort is normal. No respiratory distress.     Breath sounds: Normal breath sounds. No wheezing, rhonchi or rales.  Abdominal:     General: Abdomen is flat. Bowel sounds are normal.  Musculoskeletal:     Cervical back: Normal range of motion and neck supple.  Skin:    General: Skin is warm and dry.     Capillary Refill: Capillary refill takes less than 2 seconds.  Neurological:     General: No focal deficit present.     Mental Status: He is alert and oriented to person, place, and time.  Psychiatric:  Mood and Affect: Mood normal.        Behavior: Behavior normal.        Thought Content: Thought content normal.        Judgment: Judgment normal.     Results for orders placed or performed during the hospital encounter of 10/10/24  Resp panel by RT-PCR (RSV, Flu A&B, Covid) Anterior Nasal Swab   Collection Time: 10/10/24  9:48 AM   Specimen: Anterior Nasal Swab  Result Value Ref Range   SARS Coronavirus 2 by RT PCR NEGATIVE NEGATIVE   Influenza A by PCR NEGATIVE NEGATIVE   Influenza B by PCR NEGATIVE NEGATIVE   Resp Syncytial Virus by PCR NEGATIVE NEGATIVE  CBC with Differential   Collection Time: 10/10/24  9:48 AM  Result Value Ref Range   WBC 8.2 4.0 - 10.5 K/uL   RBC 5.27 4.22 - 5.81 MIL/uL   Hemoglobin 15.4 13.0 - 17.0 g/dL   HCT 53.0 60.9 - 47.9 %   MCV 89.0 80.0 - 100.0 fL   MCH 29.2 26.0 - 34.0 pg   MCHC 32.8 30.0 -  36.0 g/dL   RDW 87.1 88.4 - 84.4 %   Platelets 120 (L) 150 - 400 K/uL   nRBC 0.0 0.0 - 0.2 %   Neutrophils Relative % 77 %   Neutro Abs 6.5 1.7 - 7.7 K/uL   Lymphocytes Relative 14 %   Lymphs Abs 1.2 0.7 - 4.0 K/uL   Monocytes Relative 5 %   Monocytes Absolute 0.4 0.1 - 1.0 K/uL   Eosinophils Relative 2 %   Eosinophils Absolute 0.2 0.0 - 0.5 K/uL   Basophils Relative 1 %   Basophils Absolute 0.1 0.0 - 0.1 K/uL   Immature Granulocytes 1 %   Abs Immature Granulocytes 0.04 0.00 - 0.07 K/uL  Comprehensive metabolic panel   Collection Time: 10/10/24  9:48 AM  Result Value Ref Range   Sodium 137 135 - 145 mmol/L   Potassium 3.8 3.5 - 5.1 mmol/L   Chloride 102 98 - 111 mmol/L   CO2 24 22 - 32 mmol/L   Glucose, Bld 112 (H) 70 - 99 mg/dL   BUN 12 8 - 23 mg/dL   Creatinine, Ser 9.07 0.61 - 1.24 mg/dL   Calcium  9.1 8.9 - 10.3 mg/dL   Total Protein 7.4 6.5 - 8.1 g/dL   Albumin 4.3 3.5 - 5.0 g/dL   AST 22 15 - 41 U/L   ALT 8 0 - 44 U/L   Alkaline Phosphatase 68 38 - 126 U/L   Total Bilirubin 0.9 0.0 - 1.2 mg/dL   GFR, Estimated >39 >39 mL/min   Anion gap 11 5 - 15  Protime-INR   Collection Time: 10/10/24  9:48 AM  Result Value Ref Range   Prothrombin Time 24.6 (H) 11.4 - 15.2 seconds   INR 2.1 (H) 0.8 - 1.2  APTT   Collection Time: 10/10/24  9:48 AM  Result Value Ref Range   aPTT 50 (H) 24 - 36 seconds  Troponin T, High Sensitivity   Collection Time: 10/10/24  9:48 AM  Result Value Ref Range   Troponin T High Sensitivity 25 (H) 0 - 19 ng/L  Troponin T, High Sensitivity   Collection Time: 10/10/24 11:03 AM  Result Value Ref Range   Troponin T High Sensitivity 23 (H) 0 - 19 ng/L      Assessment & Plan:   Problem List Items Addressed This Visit       Respiratory  Airway hyperreactivity   Will start Symbicort.  Discussed how to use medication. Discussed rinsing mouth out after use.        Relevant Medications   budesonide-formoterol (SYMBICORT) 160-4.5 MCG/ACT inhaler      Hematopoietic and Hemostatic   Antithrombin 3 deficiency - Primary   Chronic,  4 today which is within goal.  Goal INR 2.0-3.0.  Skip Tuesday and Wednesday dose.  Then Continue -5 mg of Coumadin  daily.  Follow-up INR in 1 week.  Continue with consistent amounts of green leafy vegetables.  Will adjust dose if needed at that time.  Bleeding precautions discussed with patient at visit today.       Relevant Orders   CoaguChek XS/INR Waived (STAT)        Follow up plan: Return in about 1 week (around 10/23/2024) for Coag check.

## 2024-10-16 NOTE — Assessment & Plan Note (Signed)
 Chronic,  4 today which is within goal.  Goal INR 2.0-3.0.  Skip Tuesday and Wednesday dose.  Then Continue -5 mg of Coumadin  daily.  Follow-up INR in 1 week.  Continue with consistent amounts of green leafy vegetables.  Will adjust dose if needed at that time.  Bleeding precautions discussed with patient at visit today.

## 2024-10-23 ENCOUNTER — Ambulatory Visit: Admitting: Nurse Practitioner

## 2024-10-23 ENCOUNTER — Encounter: Payer: Self-pay | Admitting: Nurse Practitioner

## 2024-10-23 VITALS — BP 123/72 | HR 60 | Temp 97.3°F | Ht 72.01 in | Wt 158.2 lb

## 2024-10-23 DIAGNOSIS — D6859 Other primary thrombophilia: Secondary | ICD-10-CM

## 2024-10-23 LAB — COAGUCHEK XS/INR WAIVED
INR: 2.1 — ABNORMAL HIGH (ref 0.9–1.1)
Prothrombin Time: 24.7 s

## 2024-10-23 NOTE — Assessment & Plan Note (Signed)
 Chronic,  2.1 today which is within goal.  Goal INR 2.0-3.0.   Then Continue -5 mg of Coumadin  daily.  Follow-up INR in 1 month.  Continue with consistent amounts of green leafy vegetables.  Will adjust dose if needed at that time.  Bleeding precautions discussed with patient at visit today.

## 2024-10-23 NOTE — Progress Notes (Signed)
 BP 123/72 (BP Location: Left Arm, Patient Position: Sitting, Cuff Size: Normal)   Pulse 60   Temp (!) 97.3 F (36.3 C) (Oral)   Ht 6' 0.01 (1.829 m)   Wt 158 lb 3.2 oz (71.8 kg)   SpO2 97%   BMI 21.45 kg/m    Subjective:    Patient ID: Thomas [name], male    DOB: 1946/03/20, 78 y.o.   MRN: [id number]  HPI: Thomas [name] is a 78 y.o. male  Chief Complaint  Patient presents with   antithrombin   COUMADIN  CHECK INR Today: 2.1 Present Coumadin  dose: 5mg  daily Goal: 2.0-3.0 Excessive bruising: no Nose bleeding: no Rectal bleeding: no Prolonged menstrual cycles: N/A Eating diet with consistent amounts of foods containing Vitamin K :yes Any recent antibiotic use? no   Relevant past medical, surgical, family and social history reviewed and updated as indicated. Interim medical history since our last visit reviewed. Allergies and medications reviewed and updated.  Review of Systems  Eyes:  Negative for visual disturbance.  Respiratory:  Negative for cough and shortness of breath.   Cardiovascular:  Negative for chest pain and leg swelling.  Neurological:  Negative for light-headedness and headaches.    Per HPI unless specifically indicated above     Objective:     BP 123/72 (BP Location: Left Arm, Patient Position: Sitting, Cuff Size: Normal)   Pulse 60   Temp (!) 97.3 F (36.3 C) (Oral)   Ht 6' 0.01 (1.829 m)   Wt 158 lb 3.2 oz (71.8 kg)   SpO2 97%   BMI 21.45 kg/m   Wt Readings from Last 3 Encounters:  10/23/24 158 lb 3.2 oz (71.8 kg)  10/16/24 160 lb 12.8 oz (72.9 kg)  10/10/24 160 lb 7.9 oz (72.8 kg)    Physical Exam Vitals and nursing note reviewed.  Constitutional:      General: He is not in acute distress.    Appearance: Normal appearance. He is not ill-appearing, toxic-appearing or diaphoretic.  HENT:     Head: Normocephalic.     Right Ear: External ear normal.     Left Ear: External ear normal.     Nose: Nose normal. No congestion or  rhinorrhea.     Mouth/Throat:     Mouth: Mucous membranes are moist.  Eyes:     General:        Right eye: No discharge.        Left eye: No discharge.     Extraocular Movements: Extraocular movements intact.     Conjunctiva/sclera: Conjunctivae normal.     Pupils: Pupils are equal, round, and reactive to light.  Cardiovascular:     Rate and Rhythm: Normal rate and regular rhythm.     Heart sounds: No murmur heard. Pulmonary:     Effort: Pulmonary effort is normal. No respiratory distress.     Breath sounds: Normal breath sounds. No wheezing, rhonchi or rales.  Abdominal:     General: Abdomen is flat. Bowel sounds are normal.  Musculoskeletal:     Cervical back: Normal range of motion and neck supple.  Skin:    General: Skin is warm and dry.     Capillary Refill: Capillary refill takes less than 2 seconds.  Neurological:     General: No focal deficit present.     Mental Status: He is alert and oriented to person, place, and time.  Psychiatric:        Mood and Affect: Mood normal.  Behavior: Behavior normal.        Thought Content: Thought content normal.        Judgment: Judgment normal.     Results for orders placed or performed in visit on 10/16/24  CoaguChek XS/INR Waived (STAT)   Collection Time: 10/16/24  1:31 PM  Result Value Ref Range   INR 4.0 (H) 0.9 - 1.1   Prothrombin Time 48.3 sec      Assessment & Plan:   Problem List Items Addressed This Visit       Hematopoietic and Hemostatic   Antithrombin 3 deficiency - Primary   Chronic,  2.1 today which is within goal.  Goal INR 2.0-3.0.   Then Continue -5 mg of Coumadin  daily.  Follow-up INR in 1 month.  Continue with consistent amounts of green leafy vegetables.  Will adjust dose if needed at that time.  Bleeding precautions discussed with patient at visit today.       Relevant Orders   CoaguChek XS/INR Waived (STAT)         Follow up plan: Return in about 4 weeks (around 11/20/2024) for Coag  check.

## 2024-11-13 ENCOUNTER — Telehealth: Payer: Self-pay

## 2024-11-13 NOTE — Progress Notes (Signed)
° °  11/13/2024  Patient ID: Thomas [name], male   DOB: [birth year], 78 y.o.   MRN: [id number]  This patient is appearing on a report for being at risk of failing the adherence measure for cholesterol (statin) medications this calendar year.   Medication: atorvastatin  10mg  Last fill date: 11/08/2024 for 90 day supply  Insurance report was not up to date. No action needed at this time.   Channing DELENA Mealing, PharmD, DPLA

## 2024-11-14 ENCOUNTER — Ambulatory Visit: Admitting: Nurse Practitioner

## 2024-11-20 ENCOUNTER — Encounter: Payer: Self-pay | Admitting: Nurse Practitioner

## 2024-11-20 ENCOUNTER — Ambulatory Visit: Admitting: Nurse Practitioner

## 2024-11-20 ENCOUNTER — Ambulatory Visit (INDEPENDENT_AMBULATORY_CARE_PROVIDER_SITE_OTHER): Admitting: Nurse Practitioner

## 2024-11-20 VITALS — BP 114/62 | HR 71 | Temp 97.9°F | Ht 72.01 in | Wt 156.6 lb

## 2024-11-20 DIAGNOSIS — Z0000 Encounter for general adult medical examination without abnormal findings: Secondary | ICD-10-CM | POA: Diagnosis not present

## 2024-11-20 DIAGNOSIS — Z7901 Long term (current) use of anticoagulants: Secondary | ICD-10-CM

## 2024-11-20 DIAGNOSIS — I10 Essential (primary) hypertension: Secondary | ICD-10-CM

## 2024-11-20 DIAGNOSIS — R7301 Impaired fasting glucose: Secondary | ICD-10-CM

## 2024-11-20 DIAGNOSIS — D509 Iron deficiency anemia, unspecified: Secondary | ICD-10-CM

## 2024-11-20 LAB — COAGUCHEK XS/INR WAIVED
INR: 1.8 — ABNORMAL HIGH (ref 0.9–1.1)
Prothrombin Time: 22.2 s

## 2024-11-20 NOTE — Progress Notes (Signed)
 "  BP 114/62 (BP Location: Left Arm, Cuff Size: Normal)   Pulse 71   Temp 97.9 F (36.6 C) (Oral)   Ht 6' 0.01 (1.829 m)   Wt 156 lb 9.6 oz (71 kg)   SpO2 95%   BMI 21.23 kg/m    Subjective:    Patient ID: Thomas [name], male    DOB: 11-28-1946, 78 y.o.   MRN: [id number]  HPI: Thomas [name] is a 78 y.o. male  Chief Complaint  Patient presents with   coagulation check    4 week f/u   COUMADIN  CHECK Patient states he has been taking over the counter cough and cold medication.  Likely the cause of his INR being low today.  INR Today: 1.8- hasn't changed his diet.   Present Coumadin  dose: 5 mg of Coumadin  daily Goal: 2.0-3.0 Excessive bruising: no Nose bleeding: no Rectal bleeding: no Prolonged menstrual cycles: N/A Eating diet with consistent amounts of foods containing Vitamin K :yes Any recent antibiotic use? no Relevant past medical, surgical, family and social history reviewed and updated as indicated. Interim medical history since our last visit reviewed. Allergies and medications reviewed and updated.  HYPERTENSION Denies concerns regarding medications. Hypertension status: controlled  Satisfied with current treatment? no Duration of hypertension: years BP monitoring frequency:  not checking BP range:  BP medication side effects:  no Medication compliance: excellent compliance Previous BP meds:amlodipine  Aspirin: Coumadin  Recurrent headaches: no Visual changes: no Palpitations: no Dyspnea: no Chest pain: no Lower extremity edema: no Dizzy/lightheaded: no  Does not want further treatment for cancer.  Does not want to follow up with GI or oncology.   Relevant past medical, surgical, family and social history reviewed and updated as indicated. Interim medical history since our last visit reviewed. Allergies and medications reviewed and updated.  Review of Systems  HENT:  Positive for congestion.   Eyes:  Negative for visual disturbance.   Respiratory:  Positive for cough. Negative for shortness of breath.   Cardiovascular:  Negative for chest pain and leg swelling.  Neurological:  Negative for light-headedness and headaches.    Per HPI unless specifically indicated above     Objective:     BP 114/62 (BP Location: Left Arm, Cuff Size: Normal)   Pulse 71   Temp 97.9 F (36.6 C) (Oral)   Ht 6' 0.01 (1.829 m)   Wt 156 lb 9.6 oz (71 kg)   SpO2 95%   BMI 21.23 kg/m   Wt Readings from Last 3 Encounters:  11/20/24 156 lb 9.6 oz (71 kg)  10/23/24 158 lb 3.2 oz (71.8 kg)  10/16/24 160 lb 12.8 oz (72.9 kg)    Physical Exam Vitals and nursing note reviewed.  Constitutional:      General: He is not in acute distress.    Appearance: Normal appearance. He is not ill-appearing, toxic-appearing or diaphoretic.  HENT:     Head: Normocephalic.     Right Ear: External ear normal.     Left Ear: External ear normal.     Nose: Nose normal. No congestion or rhinorrhea.     Mouth/Throat:     Mouth: Mucous membranes are moist.  Eyes:     General:        Right eye: No discharge.        Left eye: No discharge.     Extraocular Movements: Extraocular movements intact.     Conjunctiva/sclera: Conjunctivae normal.     Pupils: Pupils are equal, round, and reactive  to light.  Cardiovascular:     Rate and Rhythm: Normal rate and regular rhythm.     Heart sounds: No murmur heard. Pulmonary:     Effort: Pulmonary effort is normal. No respiratory distress.     Breath sounds: Normal breath sounds. No wheezing, rhonchi or rales.  Abdominal:     General: Abdomen is flat. Bowel sounds are normal.  Musculoskeletal:     Cervical back: Normal range of motion and neck supple.  Skin:    General: Skin is warm and dry.     Capillary Refill: Capillary refill takes less than 2 seconds.  Neurological:     General: No focal deficit present.     Mental Status: He is alert and oriented to person, place, and time.  Psychiatric:        Mood  and Affect: Mood normal.        Behavior: Behavior normal.        Thought Content: Thought content normal.        Judgment: Judgment normal.     Results for orders placed or performed in visit on 10/23/24  CoaguChek XS/INR Waived (STAT)   Collection Time: 10/23/24  1:52 PM  Result Value Ref Range   INR 2.1 (H) 0.9 - 1.1   Prothrombin Time 24.7 sec      Assessment & Plan:   Problem List Items Addressed This Visit       Cardiovascular and Mediastinum   Essential hypertension   Chronic.  Controlled.  Continue with current medication regimen of Amlodipine .  Labs ordered today.  Return to clinic in 3 months for reevaluation.  Call sooner if concerns arise.       Relevant Orders   Comprehensive metabolic panel with GFR     Endocrine   Impaired fasting glucose   Chronic.  Controlled.  Last A1c in August was 5.4%.  Continue with diet control at this time. Labs ordered today.  Return to clinic in 6 months for reevaluation.  Call sooner if concerns arise.       Relevant Orders   Hemoglobin A1c     Other   Encounter for current long-term use of anticoagulants   Chronic,  1.8 today.    Goal INR 2.0-3.0.  Continue -5 mg of Coumadin  daily.  Follow-up INR in 1 week.  Patient has been taking cough and cold medication.  Will recheck next week if still low will alter dose. Continue with consistent amounts of green leafy vegetables.  Will adjust dose if needed at that time.  Bleeding precautions discussed with patient at visit today.       Relevant Orders   CoaguChek XS/INR Waived (STAT)   IDA (iron deficiency anemia) - Primary   Chronic.  Controlled.  Continue with current medication regimen.  Labs ordered today.  Return to clinic in 6 months for reevaluation.  Call sooner if concerns arise.        Relevant Orders   CBC With Diff/Platelet   Iron Binding Cap (TIBC)([facility])   Other Visit Diagnoses       Encounter for Medicare annual wellness exam              Follow  up plan: Return in about 1 week (around 11/27/2024) for INR check .      "

## 2024-11-20 NOTE — Assessment & Plan Note (Signed)
 Chronic.  Controlled.  Continue with current medication regimen of Amlodipine.  Labs ordered today.  Return to clinic in 3 months for reevaluation.  Call sooner if concerns arise.

## 2024-11-20 NOTE — Assessment & Plan Note (Signed)
 Chronic.  Controlled.  Last A1c in August was 5.4%.  Continue with diet control at this time. Labs ordered today.  Return to clinic in 6 months for reevaluation.  Call sooner if concerns arise.

## 2024-11-20 NOTE — Assessment & Plan Note (Signed)
 Chronic,  1.8 today.    Goal INR 2.0-3.0.  Continue -5 mg of Coumadin  daily.  Follow-up INR in 1 week.  Patient has been taking cough and cold medication.  Will recheck next week if still low will alter dose. Continue with consistent amounts of green leafy vegetables.  Will adjust dose if needed at that time.  Bleeding precautions discussed with patient at visit today.

## 2024-11-20 NOTE — Progress Notes (Signed)
 "  Chief Complaint  Patient presents with   coagulation check    4 week f/u     Subjective:   Thomas [name] is a 78 y.o. male who presents for a Medicare Annual Wellness Visit.  Visit info / Clinical Intake: Medicare Wellness Visit Type:: Subsequent Annual Wellness Visit Persons participating in visit and providing information:: patient Medicare Wellness Visit Mode:: In-person (required for WTM) Interpreter Needed?: No Pre-visit prep was completed: yes AWV questionnaire completed by patient prior to visit?: no Living arrangements:: (!) lives alone Patient's Overall Health Status Rating: excellent Typical amount of pain: some Does pain affect daily life?: no Are you currently prescribed opioids?: no  Dietary Habits and Nutritional Risks How many meals a day?: 3 Eats fruit and vegetables daily?: yes Most meals are obtained by: preparing own meals In the last 2 weeks, have you had any of the following?: none Diabetic:: no  Functional Status Activities of Daily Living (to include ambulation/medication): Independent Ambulation: Independent with device- listed below Home Assistive Devices/Equipment: Cane Medication Administration: Independent Home Management (perform basic housework or laundry): Independent Manage your own finances?: yes Primary transportation is: driving Concerns about vision?: (!) yes (wears glasses) 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: yes Regular seat belt use?: yes Hospital stays in the last year:: (!) yes How many hospital stays:: 2 Reason: colonoscopy and respiratory problems  Cognitive Assessment Difficulty concentrating, remembering, or making decisions? : no Will 6CIT or Mini Cog be Completed: yes What year is it?: 0 points What month is it?: 0 points Give patient an address phrase to remember (5 components): [address] [place] what time is it?: 0 points Count backwards from 20 to 1: 0 points Say the months of the year in reverse: 0 points Repeat the address phrase from earlier: 0 points 6 CIT Score: 0 points  Advance Directives (For Healthcare) Does Patient Have a Medical Advance Directive?: No Would patient like information on creating a medical advance directive?: No - Patient declined  Reviewed/Updated  Reviewed/Updated: Reviewed All (Medical, Surgical, Family, Medications, Allergies, Care Teams, Patient Goals); Medical History; Surgical History; Family History; Medications; Allergies; Care Teams; Patient Goals    Allergies (verified) Dexamethasone   Current Medications (verified) Outpatient Encounter Medications as of 11/20/2024  Medication Sig   albuterol  (VENTOLIN  HFA) 108 (90 Base) MCG/ACT inhaler Inhale 2 puffs into the lungs every 6 (six) hours as needed for wheezing or shortness of breath.   amLODipine  (NORVASC ) 2.5 MG tablet Take 1 tablet (2.5 mg total) by mouth daily.   atorvastatin  (LIPITOR) 10 MG tablet TAKE 1 TABLET BY MOUTH EVERY DAY   budesonide -formoterol  (SYMBICORT ) 160-4.5 MCG/ACT inhaler Inhale 2 puffs into the lungs 2 (two) times daily.   diphenhydrAMINE-PE-APAP (EQ SEVERE ALLERGY & SINUS) 25-5-325 MG TABS Take 1 tablet by mouth as needed.   ferrous sulfate 325 (65  FE) MG EC tablet Take 325 mg by mouth daily with breakfast.   Pseudoephedrine-APAP-DM (DAYQUIL  MULTI-SYMPTOM COLD/FLU PO) Take 325 mg by mouth.   warfarin (COUMADIN ) 5 MG tablet Take 1 tablet (5 mg total) by mouth daily.   No facility-administered encounter medications on file as of 11/20/2024.    History: Past Medical History:  Diagnosis Date   Clotting disorder    Hyperlipidemia    Hypertension    Sinus infection    Tinnitus    Past Surgical History:  Procedure Laterality Date   CATARACT EXTRACTION Bilateral 1998, 2007   COLECTOMY Right 12/01/2023   Procedure: RIGHT COLECTOMY;  Surgeon: Rodolph Romano, MD;  Location: [facility] ORS;  Service: General;  Laterality: Right;   MOHS SURGERY Left 09/08/2018   Mohs surgery Ear Dr.  Adine Rakers [facility], Dr. Jennine   TONSILLECTOMY  1956   Family History  Problem Relation Age of Onset   Alzheimer's disease Mother    Heart attack Mother    Heart attack Father    Cancer Sister    Diabetes Sister    Diabetes Sister    Alzheimer's disease Brother    Social History   Occupational History   Occupation: Retried  Tobacco Use   Smoking status: Former    Current packs/day: 0.00    Average packs/day: 1.0 packs/day    Types: Cigarettes    Quit date: 2002    Years since quitting: 23.9   Smokeless tobacco: Never   Tobacco comments:    started smokng at age 44  Vaping Use   Vaping status: Never Used  Substance and Sexual Activity   Alcohol use: Not Currently    Comment: no etoh in 12 years   Drug use: Never   Sexual activity: Not Currently   Tobacco Counseling Counseling given: Not Answered Tobacco comments: started smokng at age 59  SDOH Screenings   Food Insecurity: No Food Insecurity (11/20/2024)  Housing: Unknown (11/20/2024)  Transportation Needs: No Transportation Needs (11/20/2024)  Utilities: Not At Risk (11/20/2024)  Depression (PHQ2-9): Low Risk (11/20/2024)  Financial Resource Strain: Low Risk  (12/21/2023)   Received from [facility]  Physical Activity: Insufficiently Active  (11/20/2024)  Social Connections: Socially Isolated (11/20/2024)  Stress: No Stress Concern Present (11/20/2024)  Tobacco Use: Medium Risk (11/20/2024)  Health Literacy: Adequate Health Literacy (11/20/2024)   See flowsheets for full screening details  Depression Screen PHQ 2 & 9 Depression Scale- Over the past 2 weeks, how often have you been bothered by any of the following problems? Little interest or pleasure in doing things: 0 Feeling down, depressed, or hopeless (PHQ Adolescent also includes...irritable): 0 PHQ-2 Total Score: 0 Trouble falling or staying asleep, or sleeping too much: 0 Feeling tired or having little energy: 0 Poor appetite or overeating (PHQ Adolescent also includes...weight loss): 0 Feeling bad about yourself - or that you are a failure or have let yourself or your family down: 0 Trouble concentrating on things, such as reading the newspaper or watching television (PHQ Adolescent also includes...like school work): 0 Moving or speaking so slowly that other people could have noticed. Or the opposite - being so fidgety or restless that you have been moving around a lot more than usual: 0 Thoughts that you would be better off dead, or of hurting yourself in some way: 0 PHQ-9 Total Score: 0 If you checked off any problems, how difficult have these problems made it for you to do your work, take care of  things at home, or get along with other people?: Not difficult at all     Goals Addressed   None          Objective:    Today's Vitals   11/20/24 1320 11/20/24 1327  BP: (!) 99/59 114/62  Pulse: 71   Temp: 97.9 F (36.6 C)   TempSrc: Oral   SpO2: 95%   Weight: 156 lb 9.6 oz (71 kg)   Height: 6' 0.01 (1.829 m)   PainSc: 5     Body mass index is 21.23 kg/m.  Hearing/Vision screen No results found. Immunizations and Health Maintenance Health Maintenance  Topic Date Due   COVID-19 Vaccine (4 - 2025-26 season) 07/31/2024   Zoster Vaccines- Shingrix (1  of 2) 01/16/2025 (Originally 05/23/1965)   Influenza Vaccine  02/27/2025 (Originally 06/30/2024)   Hepatitis C Screening  10/16/2025 (Originally 05/23/1964)   Medicare Annual Wellness (AWV)  11/20/2025   DTaP/Tdap/Td (2 - Tdap) 01/30/2031   Pneumococcal Vaccine: 50+ Years  Completed   Meningococcal B Vaccine  Aged Out        Assessment/Plan:  This is a routine wellness examination for Thomas [name].  Patient Care Team: Melvin Pao, NP as PCP - General Jennine Dancer, MD as Referring Physician (Plastic Surgery) Gregorio Adine MATSU, MD as Referring Physician (Dermatology)  I have personally reviewed and noted the following in the patients chart:   Medical and social history Use of alcohol, tobacco or illicit drugs  Current medications and supplements including opioid prescriptions. Functional ability and status Nutritional status Physical activity Advanced directives List of other physicians Hospitalizations, surgeries, and ER visits in previous 12 months Vitals Screenings to include cognitive, depression, and falls Referrals and appointments  No orders of the defined types were placed in this encounter.  In addition, I have reviewed and discussed with patient certain preventive protocols, quality metrics, and best practice recommendations. A written personalized care plan for preventive services as well as general preventive health recommendations were provided to patient.   Joya GORMAN Louder, CMA   11/20/2024   No follow-ups on file.  After Visit Summary: (In Person-Printed) AVS printed and given to the patient   "

## 2024-11-20 NOTE — Assessment & Plan Note (Signed)
 Chronic.  Controlled.  Continue with current medication regimen.  Labs ordered today.  Return to clinic in 6 months for reevaluation.  Call sooner if concerns arise.  ? ?

## 2024-11-21 ENCOUNTER — Ambulatory Visit: Admitting: Nurse Practitioner

## 2024-11-21 ENCOUNTER — Ambulatory Visit: Payer: Self-pay | Admitting: Nurse Practitioner

## 2024-11-21 LAB — COMPREHENSIVE METABOLIC PANEL WITH GFR
ALT: 12 IU/L (ref 0–44)
AST: 28 IU/L (ref 0–40)
Albumin: 4.1 g/dL (ref 3.8–4.8)
Alkaline Phosphatase: 69 IU/L (ref 47–123)
BUN/Creatinine Ratio: 17 (ref 10–24)
BUN: 17 mg/dL (ref 8–27)
Bilirubin Total: 0.5 mg/dL (ref 0.0–1.2)
CO2: 24 mmol/L (ref 20–29)
Calcium: 8.3 mg/dL — ABNORMAL LOW (ref 8.6–10.2)
Chloride: 97 mmol/L (ref 96–106)
Creatinine, Ser: 1.03 mg/dL (ref 0.76–1.27)
Globulin, Total: 2.3 g/dL (ref 1.5–4.5)
Glucose: 104 mg/dL — ABNORMAL HIGH (ref 70–99)
Potassium: 3.8 mmol/L (ref 3.5–5.2)
Sodium: 133 mmol/L — ABNORMAL LOW (ref 134–144)
Total Protein: 6.4 g/dL (ref 6.0–8.5)
eGFR: 74 mL/min/1.73

## 2024-11-21 LAB — IRON AND TIBC
Iron Saturation: 22 % (ref 15–55)
Iron: 51 ug/dL (ref 38–169)
Total Iron Binding Capacity: 229 ug/dL — ABNORMAL LOW (ref 250–450)
UIBC: 178 ug/dL (ref 111–343)

## 2024-11-21 LAB — HEMOGLOBIN A1C
Est. average glucose Bld gHb Est-mCnc: 114 mg/dL
Hgb A1c MFr Bld: 5.6 % (ref 4.8–5.6)

## 2024-11-21 LAB — CBC WITH DIFF/PLATELET
Basophils Absolute: 0 x10E3/uL (ref 0.0–0.2)
Basos: 0 %
EOS (ABSOLUTE): 0 x10E3/uL (ref 0.0–0.4)
Eos: 1 %
Hematocrit: 46.5 % (ref 37.5–51.0)
Hemoglobin: 15.2 g/dL (ref 13.0–17.7)
Immature Grans (Abs): 0 x10E3/uL (ref 0.0–0.1)
Immature Granulocytes: 0 %
Lymphocytes Absolute: 1.5 x10E3/uL (ref 0.7–3.1)
Lymphs: 38 %
MCH: 29.4 pg (ref 26.6–33.0)
MCHC: 32.7 g/dL (ref 31.5–35.7)
MCV: 90 fL (ref 79–97)
Monocytes Absolute: 0.3 x10E3/uL (ref 0.1–0.9)
Monocytes: 7 %
Neutrophils Absolute: 2.1 x10E3/uL (ref 1.4–7.0)
Neutrophils: 54 %
Platelets: 93 x10E3/uL — CL (ref 150–450)
RBC: 5.17 x10E6/uL (ref 4.14–5.80)
RDW: 12.9 % (ref 11.6–15.4)
WBC: 4 x10E3/uL (ref 3.4–10.8)

## 2024-11-27 ENCOUNTER — Ambulatory Visit (INDEPENDENT_AMBULATORY_CARE_PROVIDER_SITE_OTHER): Admitting: Nurse Practitioner

## 2024-11-27 ENCOUNTER — Encounter: Payer: Self-pay | Admitting: Nurse Practitioner

## 2024-11-27 ENCOUNTER — Ambulatory Visit: Payer: Self-pay | Admitting: Nurse Practitioner

## 2024-11-27 VITALS — BP 127/63 | HR 65 | Temp 97.5°F | Ht 72.0 in | Wt 158.0 lb

## 2024-11-27 DIAGNOSIS — Z7901 Long term (current) use of anticoagulants: Secondary | ICD-10-CM

## 2024-11-27 DIAGNOSIS — R062 Wheezing: Secondary | ICD-10-CM | POA: Diagnosis not present

## 2024-11-27 LAB — COAGUCHEK XS/INR WAIVED
INR: 3.5 — ABNORMAL HIGH (ref 0.9–1.1)
Prothrombin Time: 42.4 s

## 2024-11-27 NOTE — Assessment & Plan Note (Signed)
 Chronic,  3.5 today.    Goal INR 2.0-3.0.  Continue -5 mg of Coumadin  daily.  Follow-up INR in 1 week.  Patient has been taking cough and cold medication.  Will recheck next week if still low will alter dose. Continue with consistent amounts of green leafy vegetables.  Will adjust dose if needed at that time.  Bleeding precautions discussed with patient at visit today.

## 2024-11-27 NOTE — Progress Notes (Signed)
 "  BP 127/63   Pulse 65   Temp (!) 97.5 F (36.4 C) (Oral)   Ht 6' (1.829 m)   Wt 158 lb (71.7 kg)   SpO2 97%   BMI 21.43 kg/m    Subjective:    Patient ID: Thomas [name], male    DOB: 1946-01-14, 78 y.o.   MRN: [id number]  HPI: Thomas [name] is a 78 y.o. male  Chief Complaint  Patient presents with   Coagulation Disorder   COUMADIN  CHECK Patient has been sick for a couple of weeks.  He has been taking cough and cold medication.  He was 1.8 with cough and cold medication. He has not been consistent with his medication.   INR Today: 3.5 Present Coumadin  dose: 5mg  daily Goal: 2.0-3.0 Excessive bruising: no Nose bleeding: no Rectal bleeding: no Prolonged menstrual cycles: N/A Eating diet with consistent amounts of foods containing Vitamin K :yes Any recent antibiotic use? no   Relevant past medical, surgical, family and social history reviewed and updated as indicated. Interim medical history since our last visit reviewed. Allergies and medications reviewed and updated.  Review of Systems  Eyes:  Negative for visual disturbance.  Respiratory:  Negative for cough and shortness of breath.   Cardiovascular:  Negative for chest pain and leg swelling.  Neurological:  Negative for light-headedness and headaches.    Per HPI unless specifically indicated above     Objective:     BP 127/63   Pulse 65   Temp (!) 97.5 F (36.4 C) (Oral)   Ht 6' (1.829 m)   Wt 158 lb (71.7 kg)   SpO2 97%   BMI 21.43 kg/m   Wt Readings from Last 3 Encounters:  11/27/24 158 lb (71.7 kg)  11/20/24 156 lb 9.6 oz (71 kg)  10/23/24 158 lb 3.2 oz (71.8 kg)    Physical Exam Vitals and nursing note reviewed.  Constitutional:      General: He is not in acute distress.    Appearance: Normal appearance. He is not ill-appearing, toxic-appearing or diaphoretic.  HENT:     Head: Normocephalic.     Right Ear: External ear normal.     Left Ear: External ear normal.     Nose: Nose normal.  No congestion or rhinorrhea.     Mouth/Throat:     Mouth: Mucous membranes are moist.  Eyes:     General:        Right eye: No discharge.        Left eye: No discharge.     Extraocular Movements: Extraocular movements intact.     Conjunctiva/sclera: Conjunctivae normal.     Pupils: Pupils are equal, round, and reactive to light.  Cardiovascular:     Rate and Rhythm: Normal rate and regular rhythm.     Heart sounds: No murmur heard. Pulmonary:     Effort: Pulmonary effort is normal. No respiratory distress.     Breath sounds: Wheezing present. No rhonchi or rales.  Abdominal:     General: Abdomen is flat. Bowel sounds are normal.  Musculoskeletal:     Cervical back: Normal range of motion and neck supple.  Skin:    General: Skin is warm and dry.     Capillary Refill: Capillary refill takes less than 2 seconds.  Neurological:     General: No focal deficit present.     Mental Status: He is alert and oriented to person, place, and time.  Psychiatric:  Mood and Affect: Mood normal.        Behavior: Behavior normal.        Thought Content: Thought content normal.        Judgment: Judgment normal.     Results for orders placed or performed in visit on 11/20/24  CoaguChek XS/INR Waived (STAT)   Collection Time: 11/20/24  2:04 PM  Result Value Ref Range   INR 1.8 (H) 0.9 - 1.1   Prothrombin Time 22.2 sec  Comprehensive metabolic panel with GFR   Collection Time: 11/20/24  2:05 PM  Result Value Ref Range   Glucose 104 (H) 70 - 99 mg/dL   BUN 17 8 - 27 mg/dL   Creatinine, Ser 8.96 0.76 - 1.27 mg/dL   eGFR 74 >40 fO/fpw/8.26   BUN/Creatinine Ratio 17 10 - 24   Sodium 133 (L) 134 - 144 mmol/L   Potassium 3.8 3.5 - 5.2 mmol/L   Chloride 97 96 - 106 mmol/L   CO2 24 20 - 29 mmol/L   Calcium  8.3 (L) 8.6 - 10.2 mg/dL   Total Protein 6.4 6.0 - 8.5 g/dL   Albumin 4.1 3.8 - 4.8 g/dL   Globulin, Total 2.3 1.5 - 4.5 g/dL   Bilirubin Total 0.5 0.0 - 1.2 mg/dL   Alkaline  Phosphatase 69 47 - 123 IU/L   AST 28 0 - 40 IU/L   ALT 12 0 - 44 IU/L  Hemoglobin A1c   Collection Time: 11/20/24  2:05 PM  Result Value Ref Range   Hgb A1c MFr Bld 5.6 4.8 - 5.6 %   Est. average glucose Bld gHb Est-mCnc 114 mg/dL  CBC With Diff/Platelet   Collection Time: 11/20/24  2:05 PM  Result Value Ref Range   WBC 4.0 3.4 - 10.8 x10E3/uL   RBC 5.17 4.14 - 5.80 x10E6/uL   Hemoglobin 15.2 13.0 - 17.7 g/dL   Hematocrit 53.4 62.4 - 51.0 %   MCV 90 79 - 97 fL   MCH 29.4 26.6 - 33.0 pg   MCHC 32.7 31.5 - 35.7 g/dL   RDW 87.0 88.3 - 84.5 %   Platelets 93 (LL) 150 - 450 x10E3/uL   Neutrophils 54 Not Estab. %   Lymphs 38 Not Estab. %   Monocytes 7 Not Estab. %   Eos 1 Not Estab. %   Basos 0 Not Estab. %   Neutrophils Absolute 2.1 1.4 - 7.0 x10E3/uL   Lymphocytes Absolute 1.5 0.7 - 3.1 x10E3/uL   Monocytes Absolute 0.3 0.1 - 0.9 x10E3/uL   EOS (ABSOLUTE) 0.0 0.0 - 0.4 x10E3/uL   Basophils Absolute 0.0 0.0 - 0.2 x10E3/uL   Immature Granulocytes 0 Not Estab. %   Immature Grans (Abs) 0.0 0.0 - 0.1 x10E3/uL   Hematology Comments: Note:   Iron Binding Cap (TIBC)([facility])   Collection Time: 11/20/24  2:05 PM  Result Value Ref Range   Total Iron Binding Capacity 229 (L) 250 - 450 ug/dL   UIBC 821 888 - 656 ug/dL   Iron 51 38 - 830 ug/dL   Iron Saturation 22 15 - 55 %      Assessment & Plan:   Problem List Items Addressed This Visit       Other   Encounter for current long-term use of anticoagulants - Primary   Chronic,  3.5 today.    Goal INR 2.0-3.0.  Continue -5 mg of Coumadin  daily.  Follow-up INR in 1 week.  Patient has been taking cough and cold medication.  Will recheck next week if still low will alter dose. Continue with consistent amounts of green leafy vegetables.  Will adjust dose if needed at that time.  Bleeding precautions discussed with patient at visit today.       Relevant Orders   CoaguChek XS/INR Waived (STAT)   Other Visit Diagnoses        Wheezing       Declines any treatment for cough, cold and wheezing.             Follow up plan: Return in about 1 year (around 11/27/2025) for Coag check.      "

## 2024-12-05 ENCOUNTER — Ambulatory Visit (INDEPENDENT_AMBULATORY_CARE_PROVIDER_SITE_OTHER): Admitting: Nurse Practitioner

## 2024-12-05 ENCOUNTER — Encounter: Payer: Self-pay | Admitting: Nurse Practitioner

## 2024-12-05 VITALS — BP 121/64 | HR 64 | Temp 97.6°F | Ht 72.01 in | Wt 160.6 lb

## 2024-12-05 DIAGNOSIS — Z7901 Long term (current) use of anticoagulants: Secondary | ICD-10-CM

## 2024-12-05 LAB — COAGUCHEK XS/INR WAIVED
INR: 3.4 — ABNORMAL HIGH (ref 0.9–1.1)
Prothrombin Time: 40.4 s

## 2024-12-05 MED ORDER — WARFARIN SODIUM 2.5 MG PO TABS: 2.5000 mg | ORAL_TABLET | Freq: Every day | ORAL | 1 refills | Status: DC

## 2024-12-05 MED ORDER — WARFARIN SODIUM 5 MG PO TABS: 5.0000 mg | ORAL_TABLET | Freq: Every day | ORAL | 1 refills | Status: AC

## 2024-12-05 MED ORDER — WARFARIN SODIUM 2.5 MG PO TABS: 2.5000 mg | ORAL_TABLET | Freq: Every day | ORAL | 1 refills | Status: AC

## 2024-12-05 MED ORDER — AMLODIPINE BESYLATE 2.5 MG PO TABS: 2.5000 mg | ORAL_TABLET | Freq: Every day | ORAL | 1 refills | Status: AC

## 2024-12-05 NOTE — Assessment & Plan Note (Signed)
 Chronic,  3.4 today.    Goal INR 2.0-3.0.  Continue -5 mg of Coumadin  on Monday, Tuesday, Thursday, Saturday.  2.5mg  On Wednesday, Friday and Sunday  Follow-up INR in 2 weeks.  Continue with consistent amounts of green leafy vegetables.  Bleeding precautions discussed with patient at visit today.

## 2024-12-05 NOTE — Progress Notes (Signed)
 "  BP 121/64 (BP Location: Right Arm, Cuff Size: Normal)   Pulse 64   Temp 97.6 F (36.4 C) (Oral)   Ht 6' 0.01 (1.829 m)   Wt 160 lb 9.6 oz (72.8 kg)   SpO2 96%   BMI 21.78 kg/m    Subjective:    Patient ID: Thomas [name], male    DOB: 01-12-1946, 79 y.o.   MRN: [id number]  HPI: Thomas [name] is a 79 y.o. male  Chief Complaint  Patient presents with   office visit    1 week F/u   COUMADIN  CHECK Patient has been sick for a couple of weeks.  He has been taking cough and cold medication.  He was 1.8 with cough and cold medication. He has not been consistent with his medication.   INR Today: 3.5 Present Coumadin  dose: 5mg  daily Goal: 2.0-3.0 Excessive bruising: no Nose bleeding: no Rectal bleeding: no Prolonged menstrual cycles: N/A Eating diet with consistent amounts of foods containing Vitamin K :yes Any recent antibiotic use? no   Relevant past medical, surgical, family and social history reviewed and updated as indicated. Interim medical history since our last visit reviewed. Allergies and medications reviewed and updated.  Review of Systems  Eyes:  Negative for visual disturbance.  Respiratory:  Negative for cough and shortness of breath.   Cardiovascular:  Negative for chest pain and leg swelling.  Neurological:  Negative for light-headedness and headaches.    Per HPI unless specifically indicated above     Objective:     BP 121/64 (BP Location: Right Arm, Cuff Size: Normal)   Pulse 64   Temp 97.6 F (36.4 C) (Oral)   Ht 6' 0.01 (1.829 m)   Wt 160 lb 9.6 oz (72.8 kg)   SpO2 96%   BMI 21.78 kg/m   Wt Readings from Last 3 Encounters:  12/05/24 160 lb 9.6 oz (72.8 kg)  11/27/24 158 lb (71.7 kg)  11/20/24 156 lb 9.6 oz (71 kg)    Physical Exam Vitals and nursing note reviewed.  Constitutional:      General: He is not in acute distress.    Appearance: Normal appearance. He is not ill-appearing, toxic-appearing or diaphoretic.  HENT:     Head:  Normocephalic.     Right Ear: External ear normal.     Left Ear: External ear normal.     Nose: Nose normal. No congestion or rhinorrhea.     Mouth/Throat:     Mouth: Mucous membranes are moist.  Eyes:     General:        Right eye: No discharge.        Left eye: No discharge.     Extraocular Movements: Extraocular movements intact.     Conjunctiva/sclera: Conjunctivae normal.     Pupils: Pupils are equal, round, and reactive to light.  Cardiovascular:     Rate and Rhythm: Normal rate and regular rhythm.     Heart sounds: No murmur heard. Pulmonary:     Effort: Pulmonary effort is normal. No respiratory distress.     Breath sounds: Wheezing present. No rhonchi or rales.  Abdominal:     General: Abdomen is flat. Bowel sounds are normal.  Musculoskeletal:     Cervical back: Normal range of motion and neck supple.  Skin:    General: Skin is warm and dry.     Capillary Refill: Capillary refill takes less than 2 seconds.  Neurological:     General: No focal deficit present.  Mental Status: He is alert and oriented to person, place, and time.  Psychiatric:        Mood and Affect: Mood normal.        Behavior: Behavior normal.        Thought Content: Thought content normal.        Judgment: Judgment normal.     Results for orders placed or performed in visit on 11/27/24  CoaguChek XS/INR Waived (STAT)   Collection Time: 11/27/24  1:50 PM  Result Value Ref Range   INR 3.5 (H) 0.9 - 1.1   Prothrombin Time 42.4 sec      Assessment & Plan:   Problem List Items Addressed This Visit       Other   Encounter for current long-term use of anticoagulants - Primary   Chronic,  3.4 today.    Goal INR 2.0-3.0.  Continue -5 mg of Coumadin  on Monday, Tuesday, Thursday, Saturday.  2.5mg  On Wednesday, Friday and Sunday  Follow-up INR in 2 weeks.  Continue with consistent amounts of green leafy vegetables.  Bleeding precautions discussed with patient at visit today.       Relevant  Orders   CoaguChek XS/INR Waived (STAT)           Follow up plan: Return in about 2 weeks (around 12/19/2024) for Coag check.      "

## 2024-12-19 ENCOUNTER — Ambulatory Visit: Admitting: Nurse Practitioner

## 2024-12-19 ENCOUNTER — Encounter: Payer: Self-pay | Admitting: Nurse Practitioner

## 2024-12-19 VITALS — BP 123/64 | HR 66 | Temp 97.5°F | Ht 72.01 in | Wt 158.8 lb

## 2024-12-19 DIAGNOSIS — D6859 Other primary thrombophilia: Secondary | ICD-10-CM

## 2024-12-19 DIAGNOSIS — Z7901 Long term (current) use of anticoagulants: Secondary | ICD-10-CM | POA: Diagnosis not present

## 2024-12-19 LAB — COAGUCHEK XS/INR WAIVED
INR: 1.8 — ABNORMAL HIGH (ref 0.9–1.1)
Prothrombin Time: 21.6 s

## 2024-12-19 NOTE — Progress Notes (Signed)
 "  BP 123/64 (BP Location: Left Arm, Patient Position: Sitting, Cuff Size: Normal)   Pulse 66   Temp (!) 97.5 F (36.4 C) (Oral)   Ht 6' 0.01 (1.829 m)   Wt 158 lb 12.8 oz (72 kg)   SpO2 96%   BMI 21.53 kg/m    Subjective:    Patient ID: Thomas [name], male    DOB: 12/30/1945, 79 y.o.   MRN: [id number]  HPI: Thomas [name] is a 79 y.o. male  Chief Complaint  Patient presents with   Coagulation Disorder   COUMADIN  CHECK Patient has been sick for a couple of weeks.  He has been taking cough and cold medication.  He was 1.8 with cough and cold medication. He has not been consistent with his medication.   INR Today: 3.5 Present Coumadin  dose: 5mg  daily Goal: 2.0-3.0 Excessive bruising: no Nose bleeding: no Rectal bleeding: no Prolonged menstrual cycles: N/A Eating diet with consistent amounts of foods containing Vitamin K :yes Any recent antibiotic use? no   Relevant past medical, surgical, family and social history reviewed and updated as indicated. Interim medical history since our last visit reviewed. Allergies and medications reviewed and updated.  Review of Systems  Eyes:  Negative for visual disturbance.  Respiratory:  Negative for cough and shortness of breath.   Cardiovascular:  Negative for chest pain and leg swelling.  Neurological:  Negative for light-headedness and headaches.    Per HPI unless specifically indicated above     Objective:     BP 123/64 (BP Location: Left Arm, Patient Position: Sitting, Cuff Size: Normal)   Pulse 66   Temp (!) 97.5 F (36.4 C) (Oral)   Ht 6' 0.01 (1.829 m)   Wt 158 lb 12.8 oz (72 kg)   SpO2 96%   BMI 21.53 kg/m   Wt Readings from Last 3 Encounters:  12/19/24 158 lb 12.8 oz (72 kg)  12/05/24 160 lb 9.6 oz (72.8 kg)  11/27/24 158 lb (71.7 kg)    Physical Exam Vitals and nursing note reviewed.  Constitutional:      General: He is not in acute distress.    Appearance: Normal appearance. He is not  ill-appearing, toxic-appearing or diaphoretic.  HENT:     Head: Normocephalic.     Right Ear: External ear normal.     Left Ear: External ear normal.     Nose: Nose normal. No congestion or rhinorrhea.     Mouth/Throat:     Mouth: Mucous membranes are moist.  Eyes:     General:        Right eye: No discharge.        Left eye: No discharge.     Extraocular Movements: Extraocular movements intact.     Conjunctiva/sclera: Conjunctivae normal.     Pupils: Pupils are equal, round, and reactive to light.  Cardiovascular:     Rate and Rhythm: Normal rate and regular rhythm.     Heart sounds: No murmur heard. Pulmonary:     Effort: Pulmonary effort is normal. No respiratory distress.     Breath sounds: No wheezing, rhonchi or rales.  Abdominal:     General: Abdomen is flat. Bowel sounds are normal.  Musculoskeletal:     Cervical back: Normal range of motion and neck supple.  Skin:    General: Skin is warm and dry.     Capillary Refill: Capillary refill takes less than 2 seconds.  Neurological:     General: No focal deficit  present.     Mental Status: He is alert and oriented to person, place, and time.  Psychiatric:        Mood and Affect: Mood normal.        Behavior: Behavior normal.        Thought Content: Thought content normal.        Judgment: Judgment normal.     Results for orders placed or performed in visit on 12/05/24  CoaguChek XS/INR Waived (STAT)   Collection Time: 12/05/24  2:50 PM  Result Value Ref Range   INR 3.4 (H) 0.9 - 1.1   Prothrombin Time 40.4 sec      Assessment & Plan:   Problem List Items Addressed This Visit       Hematopoietic and Hemostatic   Antithrombin 3 deficiency   Relevant Orders   CoaguChek XS/INR Waived (STAT)     Other   Encounter for current long-term use of anticoagulants - Primary   Chronic,  1.8 today.    Goal INR 2.0-3.0.  Continue -5 mg of Coumadin  x 5 days.  2.5mg  On Wednesday, and Saturday.  Follow-up INR in 2 weeks.   Continue with consistent amounts of green leafy vegetables.  Bleeding precautions discussed with patient at visit today.       Relevant Orders   CoaguChek XS/INR Waived (STAT)            Follow up plan: Return in about 2 weeks (around 01/02/2025) for Coag check.      "

## 2024-12-19 NOTE — Assessment & Plan Note (Signed)
 Chronic,  1.8 today.    Goal INR 2.0-3.0.  Continue -5 mg of Coumadin  x 5 days.  2.5mg  On Wednesday, and Saturday.  Follow-up INR in 2 weeks.  Continue with consistent amounts of green leafy vegetables.  Bleeding precautions discussed with patient at visit today.

## 2024-12-20 ENCOUNTER — Ambulatory Visit: Payer: Self-pay | Admitting: Nurse Practitioner

## 2025-01-04 ENCOUNTER — Ambulatory Visit: Payer: Self-pay | Admitting: Nurse Practitioner

## 2025-01-04 ENCOUNTER — Ambulatory Visit: Admitting: Nurse Practitioner

## 2025-01-04 ENCOUNTER — Encounter: Payer: Self-pay | Admitting: Nurse Practitioner

## 2025-01-04 VITALS — BP 112/72 | Temp 97.8°F | Resp 17 | Ht 72.01 in | Wt 156.6 lb

## 2025-01-04 DIAGNOSIS — Z7901 Long term (current) use of anticoagulants: Secondary | ICD-10-CM | POA: Diagnosis not present

## 2025-01-04 DIAGNOSIS — D6859 Other primary thrombophilia: Secondary | ICD-10-CM | POA: Diagnosis not present

## 2025-01-04 LAB — COAGUCHEK XS/INR WAIVED
INR: 1.9 — ABNORMAL HIGH (ref 0.9–1.1)
Prothrombin Time: 23 s

## 2025-01-04 MED ORDER — ALBUTEROL SULFATE HFA 108 (90 BASE) MCG/ACT IN AERS: 2.0000 | INHALATION_SPRAY | Freq: Four times a day (QID) | RESPIRATORY_TRACT | 1 refills | Status: AC | PRN

## 2025-01-04 NOTE — Assessment & Plan Note (Signed)
 Chronic,  1.8 today.    Goal INR 2.0-3.0.  Continue -5 mg of Coumadin  x 6 days.  2.5mg  Saturday.  Follow-up INR in 2 weeks.  Continue with consistent amounts of green leafy vegetables.  Bleeding precautions discussed with patient at visit today.

## 2025-01-04 NOTE — Progress Notes (Signed)
 "  BP 112/72 (BP Location: Left Arm, Patient Position: Sitting, Cuff Size: Normal)   Temp 97.8 F (36.6 C) (Oral)   Resp 17   Ht 6' 0.01 (1.829 m)   Wt 156 lb 9.6 oz (71 kg)   SpO2 98%   BMI 21.23 kg/m    Subjective:    Patient ID: Thomas [name], male    DOB: 1946-04-11, 79 y.o.   MRN: [id number]  HPI: Thomas [name] is a 79 y.o. male  Chief Complaint  Patient presents with   Follow-up    Here for follow up    COUMADIN  CHECK Patient is doing well.  Has been taking 5mg  5 days per week.  Then 2.5mg  Wednesday and Saturday INR Today: 1.9 Present Coumadin  dose: 5mg  daily Goal: 2.0-3.0 Excessive bruising: no Nose bleeding: no Rectal bleeding: no Prolonged menstrual cycles: N/A Eating diet with consistent amounts of foods containing Vitamin K :yes Any recent antibiotic use? no   Relevant past medical, surgical, family and social history reviewed and updated as indicated. Interim medical history since our last visit reviewed. Allergies and medications reviewed and updated.  Review of Systems  Eyes:  Negative for visual disturbance.  Respiratory:  Negative for cough and shortness of breath.   Cardiovascular:  Negative for chest pain and leg swelling.  Neurological:  Negative for light-headedness and headaches.    Per HPI unless specifically indicated above     Objective:     BP 112/72 (BP Location: Left Arm, Patient Position: Sitting, Cuff Size: Normal)   Temp 97.8 F (36.6 C) (Oral)   Resp 17   Ht 6' 0.01 (1.829 m)   Wt 156 lb 9.6 oz (71 kg)   SpO2 98%   BMI 21.23 kg/m   Wt Readings from Last 3 Encounters:  01/04/25 156 lb 9.6 oz (71 kg)  12/19/24 158 lb 12.8 oz (72 kg)  12/05/24 160 lb 9.6 oz (72.8 kg)    Physical Exam Vitals and nursing note reviewed.  Constitutional:      General: He is not in acute distress.    Appearance: Normal appearance. He is not ill-appearing, toxic-appearing or diaphoretic.  HENT:     Head: Normocephalic.     Right Ear:  External ear normal.     Left Ear: External ear normal.     Nose: Nose normal. No congestion or rhinorrhea.     Mouth/Throat:     Mouth: Mucous membranes are moist.  Eyes:     General:        Right eye: No discharge.        Left eye: No discharge.     Extraocular Movements: Extraocular movements intact.     Conjunctiva/sclera: Conjunctivae normal.     Pupils: Pupils are equal, round, and reactive to light.  Cardiovascular:     Rate and Rhythm: Normal rate and regular rhythm.     Heart sounds: No murmur heard. Pulmonary:     Effort: Pulmonary effort is normal. No respiratory distress.     Breath sounds: No wheezing, rhonchi or rales.  Abdominal:     General: Abdomen is flat. Bowel sounds are normal.  Musculoskeletal:     Cervical back: Normal range of motion and neck supple.  Skin:    General: Skin is warm and dry.     Capillary Refill: Capillary refill takes less than 2 seconds.  Neurological:     General: No focal deficit present.     Mental Status: He is alert and  oriented to person, place, and time.  Psychiatric:        Mood and Affect: Mood normal.        Behavior: Behavior normal.        Thought Content: Thought content normal.        Judgment: Judgment normal.     Results for orders placed or performed in visit on 12/19/24  CoaguChek XS/INR Waived (STAT)   Collection Time: 12/19/24  1:58 PM  Result Value Ref Range   INR 1.8 (H) 0.9 - 1.1   Prothrombin Time 21.6 sec      Assessment & Plan:   Problem List Items Addressed This Visit       Hematopoietic and Hemostatic   Antithrombin 3 deficiency   Relevant Orders   CoaguChek XS/INR Waived (STAT)     Other   Encounter for current long-term use of anticoagulants - Primary   Chronic,  1.8 today.    Goal INR 2.0-3.0.  Continue -5 mg of Coumadin  x 6 days.  2.5mg  Saturday.  Follow-up INR in 2 weeks.  Continue with consistent amounts of green leafy vegetables.  Bleeding precautions discussed with patient at visit  today.       Relevant Orders   CoaguChek XS/INR Waived (STAT)             Follow up plan: Return in about 2 weeks (around 01/18/2025) for Coag check.      "

## 2025-01-18 ENCOUNTER — Ambulatory Visit: Admitting: Nurse Practitioner
